# Patient Record
Sex: Male | Born: 1935 | Race: White | Hispanic: No | Marital: Married | Smoking: Former smoker
Health system: Southern US, Community
[De-identification: ages and names within clinical notes are randomized; demographics above are authoritative.]

## PROBLEM LIST (undated history)

## (undated) DIAGNOSIS — F329 Major depressive disorder, single episode, unspecified: Secondary | ICD-10-CM

## (undated) DIAGNOSIS — E559 Vitamin D deficiency, unspecified: Secondary | ICD-10-CM

## (undated) DIAGNOSIS — I493 Ventricular premature depolarization: Secondary | ICD-10-CM

## (undated) DIAGNOSIS — I4891 Unspecified atrial fibrillation: Secondary | ICD-10-CM

## (undated) DIAGNOSIS — E039 Hypothyroidism, unspecified: Secondary | ICD-10-CM

## (undated) DIAGNOSIS — I2510 Atherosclerotic heart disease of native coronary artery without angina pectoris: Secondary | ICD-10-CM

## (undated) DIAGNOSIS — H35039 Hypertensive retinopathy, unspecified eye: Secondary | ICD-10-CM

## (undated) DIAGNOSIS — F419 Anxiety disorder, unspecified: Secondary | ICD-10-CM

## (undated) DIAGNOSIS — R001 Bradycardia, unspecified: Secondary | ICD-10-CM

## (undated) DIAGNOSIS — F32A Depression, unspecified: Secondary | ICD-10-CM

## (undated) DIAGNOSIS — H3530 Unspecified macular degeneration: Secondary | ICD-10-CM

## (undated) HISTORY — PX: CHOLECYSTECTOMY: SHX55

## (undated) HISTORY — DX: Vitamin D deficiency, unspecified: E55.9

## (undated) HISTORY — DX: Unspecified atrial fibrillation: I48.91

## (undated) HISTORY — DX: Ventricular premature depolarization: I49.3

## (undated) HISTORY — DX: Unspecified macular degeneration: H35.30

## (undated) HISTORY — DX: Major depressive disorder, single episode, unspecified: F32.9

## (undated) HISTORY — DX: Bradycardia, unspecified: R00.1

## (undated) HISTORY — DX: Hypothyroidism, unspecified: E03.9

## (undated) HISTORY — DX: Anxiety disorder, unspecified: F41.9

## (undated) HISTORY — PX: EYE SURGERY: SHX253

## (undated) HISTORY — DX: Atherosclerotic heart disease of native coronary artery without angina pectoris: I25.10

## (undated) HISTORY — DX: Depression, unspecified: F32.A

## (undated) HISTORY — DX: Hypertensive retinopathy, unspecified eye: H35.039

## (undated) HISTORY — PX: HERNIA REPAIR: SHX51

## (undated) HISTORY — PX: CATARACT EXTRACTION: SUR2

---

## 2004-05-26 ENCOUNTER — Ambulatory Visit (HOSPITAL_COMMUNITY): Admission: RE | Admit: 2004-05-26 | Discharge: 2004-05-26 | Payer: Self-pay | Admitting: Specialist

## 2005-11-05 IMAGING — PT NM PET TUM IMG SKULL BASE T - THIGH
5 series · 25 of 25 positions shown · non-contrast
Comparison: The report from a CT of the chest done at [facility] on 05/10/2004 is correlated.  That study is not available for direct comparison.

CLINICAL DATA: Bilateral pulmonary nodules on outside chest CT.  No report of history of malignancy.

FDG PET-CT TUMOR IMAGING (SKULL BASE TO THIGHS):
Fasting Blood Glucose:  102.
TECHNIQUE: 16.7 mCi F-18 FDG were administered via right antedecubital fossa imaging.  Full ring PET imaging was performed from the skull base through the mid-thighs 80 minutes post-injection.  CT data was obtained and used for attenuation correction and anatomic localization only.  (This was not acquired as a diagnostic CT examination.)

[Series 1: pet ac · axial · 3.3mm · 4.69mm/px · z∈[-870,+0]mm · 7 of 267 slices shown]
[im 1/267]
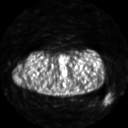
[im 45/267]
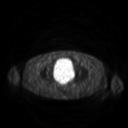
[im 89/267]
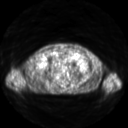
[im 134/267]
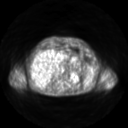
[im 178/267]
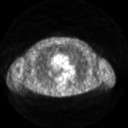
[im 222/267]
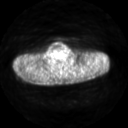
[im 267/267]
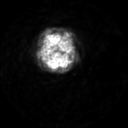

[Series 2: pet nac · axial · 3.3mm · 4.69mm/px · z∈[-870,+0]mm · 8 of 267 slices shown]
[im 1/267]
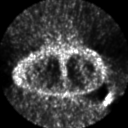
[im 39/267]
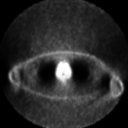
[im 77/267]
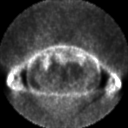
[im 115/267]
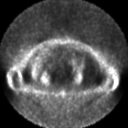
[im 153/267]
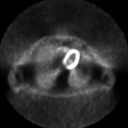
[im 191/267]
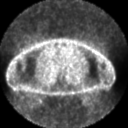
[im 229/267]
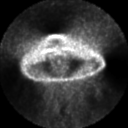
[im 267/267]
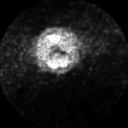

[Series 2: ct images · axial · 3.8mm · 0.98mm/px · z∈[-870,+0]mm · 8 of 267 slices shown]
[im 1/267]
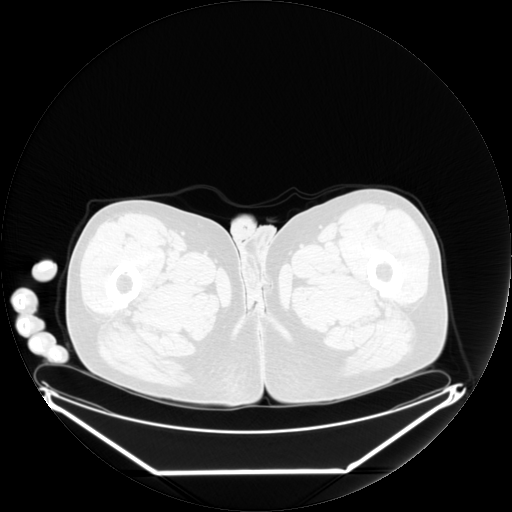
[im 39/267]
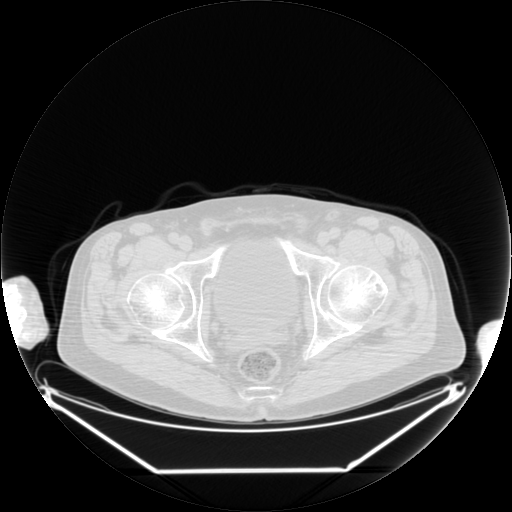
[im 77/267]
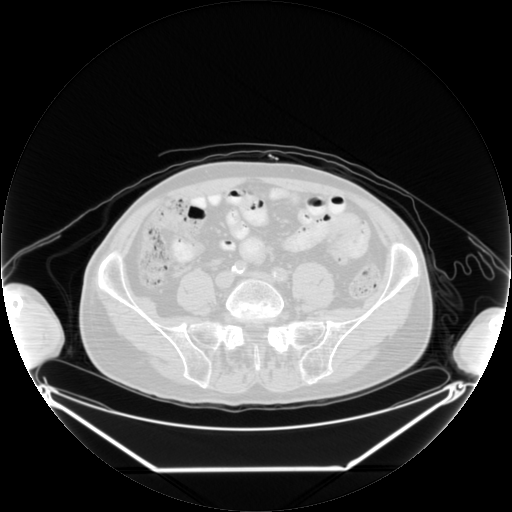
[im 115/267]
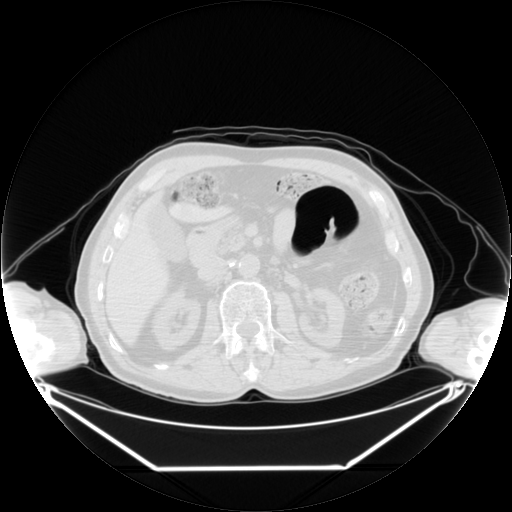
[im 153/267]
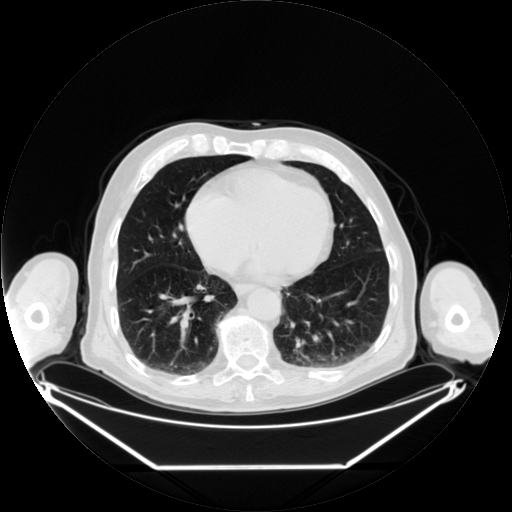
[im 191/267]
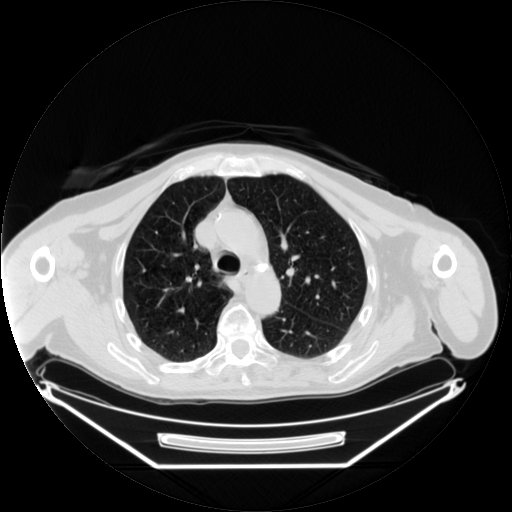
[im 229/267]
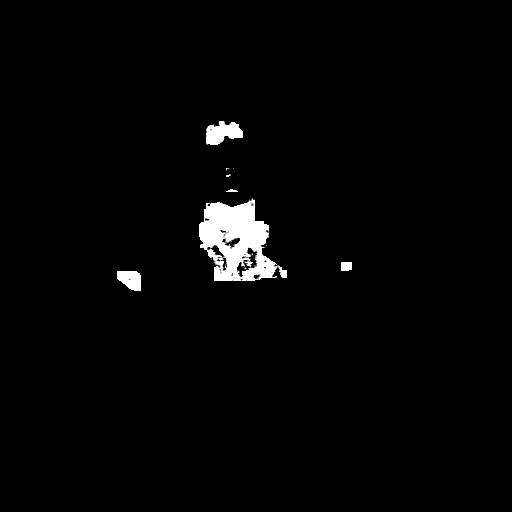
[im 267/267  brain]
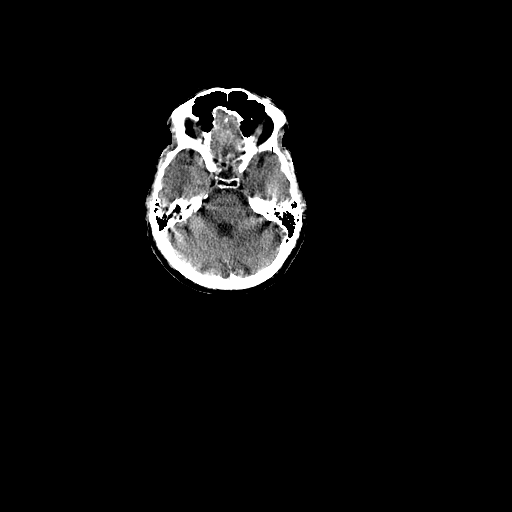

[Series 123: mip · coronal · 3.3mm · 4.69mm/px · 1 of 30 slices shown]
[im 1/30]
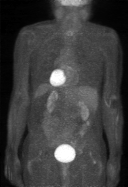

[Series 150: reformatted · axial · 3.3mm · 0.98mm/px · 1 of 5 slices shown]
[im 1/5]
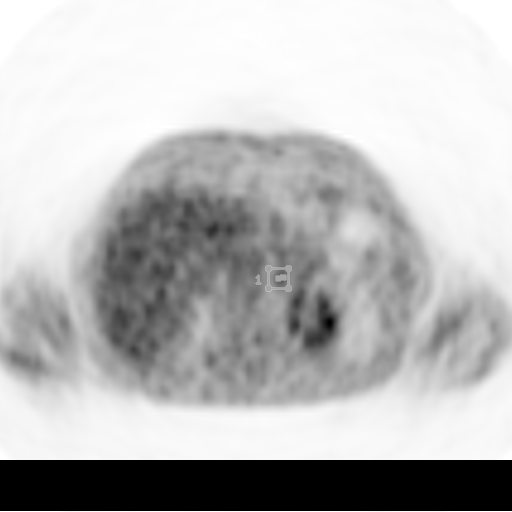

[25 of 25 positions shown; findings below may reference images not displayed]

FINDINGS: There is no hypermetabolic activity associated with the small nodule at the right posterior costophrenic angle.  This nodule measures 1.5 x 0.8cm transverse on CT image #132.  More superiorly in the left lower lobe, there is an irregular pulmonary nodule which measures 1.3 x 1.4cm on image #105.  No hypermetabolic activity is seen associated with this nodule either.  Its maximal SUV is 1.7 grams/ml.  
  No hypermetabolic nodal activity is seen within the neck, chest, abdomen, or pelvis.  The patient was noted to have small adrenal masses on the prior CT.  his measures 1.6cm in diameter on image #140 and measures 5 Hounsfield units.  emonstrates hypermetabolic activity, and these are compatible with incidental adrenal adenomas.  CT images demonstrate probable scarring in the right inguinal region with mildly increased metabolic activity on the PET images.
IMPRESSION: 1. Neither of the patient?s pulmonary nodules demonstrates hypermetabolic activity.  This would support a benign diagnosis.  Per the recent CT report, these nodules are stable from a prior CT performed 07/26/2000.  Routine CT follow-up could be performed to document further stability if clinically warranted.
2. There is no abnormal activity associated with the adrenal glands.  There are small adrenal adenomas bilaterally, larger on the left.
3. Probable postoperative scarring in the right groin.  Correlate clinically.

## 2014-03-17 DIAGNOSIS — I10 Essential (primary) hypertension: Secondary | ICD-10-CM | POA: Diagnosis not present

## 2014-03-17 DIAGNOSIS — E039 Hypothyroidism, unspecified: Secondary | ICD-10-CM | POA: Diagnosis not present

## 2014-03-17 DIAGNOSIS — I25810 Atherosclerosis of coronary artery bypass graft(s) without angina pectoris: Secondary | ICD-10-CM | POA: Diagnosis not present

## 2014-03-17 DIAGNOSIS — Z1389 Encounter for screening for other disorder: Secondary | ICD-10-CM | POA: Diagnosis not present

## 2014-03-17 DIAGNOSIS — R413 Other amnesia: Secondary | ICD-10-CM | POA: Diagnosis not present

## 2014-06-17 DIAGNOSIS — M1990 Unspecified osteoarthritis, unspecified site: Secondary | ICD-10-CM | POA: Diagnosis not present

## 2014-06-17 DIAGNOSIS — N289 Disorder of kidney and ureter, unspecified: Secondary | ICD-10-CM | POA: Diagnosis not present

## 2014-06-17 DIAGNOSIS — Z23 Encounter for immunization: Secondary | ICD-10-CM | POA: Diagnosis not present

## 2014-06-17 DIAGNOSIS — Z9181 History of falling: Secondary | ICD-10-CM | POA: Diagnosis not present

## 2014-06-17 DIAGNOSIS — I10 Essential (primary) hypertension: Secondary | ICD-10-CM | POA: Diagnosis not present

## 2014-06-17 DIAGNOSIS — E039 Hypothyroidism, unspecified: Secondary | ICD-10-CM | POA: Diagnosis not present

## 2014-06-17 DIAGNOSIS — Z1389 Encounter for screening for other disorder: Secondary | ICD-10-CM | POA: Diagnosis not present

## 2014-06-17 DIAGNOSIS — D500 Iron deficiency anemia secondary to blood loss (chronic): Secondary | ICD-10-CM | POA: Diagnosis not present

## 2014-06-17 DIAGNOSIS — Z6826 Body mass index (BMI) 26.0-26.9, adult: Secondary | ICD-10-CM | POA: Diagnosis not present

## 2014-06-25 DIAGNOSIS — H26493 Other secondary cataract, bilateral: Secondary | ICD-10-CM | POA: Diagnosis not present

## 2014-06-25 DIAGNOSIS — H524 Presbyopia: Secondary | ICD-10-CM | POA: Diagnosis not present

## 2014-08-20 DIAGNOSIS — R109 Unspecified abdominal pain: Secondary | ICD-10-CM | POA: Diagnosis not present

## 2014-08-20 DIAGNOSIS — L259 Unspecified contact dermatitis, unspecified cause: Secondary | ICD-10-CM | POA: Diagnosis not present

## 2014-08-20 DIAGNOSIS — Z6827 Body mass index (BMI) 27.0-27.9, adult: Secondary | ICD-10-CM | POA: Diagnosis not present

## 2014-09-29 DIAGNOSIS — H109 Unspecified conjunctivitis: Secondary | ICD-10-CM | POA: Diagnosis not present

## 2014-09-29 DIAGNOSIS — Z6826 Body mass index (BMI) 26.0-26.9, adult: Secondary | ICD-10-CM | POA: Diagnosis not present

## 2014-09-29 DIAGNOSIS — R600 Localized edema: Secondary | ICD-10-CM | POA: Diagnosis not present

## 2014-09-29 DIAGNOSIS — L03211 Cellulitis of face: Secondary | ICD-10-CM | POA: Diagnosis not present

## 2014-10-09 DIAGNOSIS — Z6826 Body mass index (BMI) 26.0-26.9, adult: Secondary | ICD-10-CM | POA: Diagnosis not present

## 2014-10-09 DIAGNOSIS — H109 Unspecified conjunctivitis: Secondary | ICD-10-CM | POA: Diagnosis not present

## 2014-10-17 DIAGNOSIS — E559 Vitamin D deficiency, unspecified: Secondary | ICD-10-CM | POA: Diagnosis not present

## 2014-10-17 DIAGNOSIS — D510 Vitamin B12 deficiency anemia due to intrinsic factor deficiency: Secondary | ICD-10-CM | POA: Diagnosis not present

## 2014-10-17 DIAGNOSIS — Z125 Encounter for screening for malignant neoplasm of prostate: Secondary | ICD-10-CM | POA: Diagnosis not present

## 2014-10-17 DIAGNOSIS — I10 Essential (primary) hypertension: Secondary | ICD-10-CM | POA: Diagnosis not present

## 2014-10-17 DIAGNOSIS — E039 Hypothyroidism, unspecified: Secondary | ICD-10-CM | POA: Diagnosis not present

## 2014-10-21 DIAGNOSIS — I4891 Unspecified atrial fibrillation: Secondary | ICD-10-CM | POA: Diagnosis not present

## 2014-10-21 DIAGNOSIS — H109 Unspecified conjunctivitis: Secondary | ICD-10-CM | POA: Diagnosis not present

## 2014-10-21 DIAGNOSIS — Z0000 Encounter for general adult medical examination without abnormal findings: Secondary | ICD-10-CM | POA: Diagnosis not present

## 2014-10-21 DIAGNOSIS — Z1212 Encounter for screening for malignant neoplasm of rectum: Secondary | ICD-10-CM | POA: Diagnosis not present

## 2014-10-21 DIAGNOSIS — I10 Essential (primary) hypertension: Secondary | ICD-10-CM | POA: Diagnosis not present

## 2014-12-10 DIAGNOSIS — Z23 Encounter for immunization: Secondary | ICD-10-CM | POA: Diagnosis not present

## 2015-01-21 DIAGNOSIS — I10 Essential (primary) hypertension: Secondary | ICD-10-CM | POA: Diagnosis not present

## 2015-01-21 DIAGNOSIS — E099 Drug or chemical induced diabetes mellitus without complications: Secondary | ICD-10-CM | POA: Diagnosis not present

## 2015-01-21 DIAGNOSIS — R634 Abnormal weight loss: Secondary | ICD-10-CM | POA: Diagnosis not present

## 2015-01-21 DIAGNOSIS — E559 Vitamin D deficiency, unspecified: Secondary | ICD-10-CM | POA: Diagnosis not present

## 2015-01-21 DIAGNOSIS — Z1389 Encounter for screening for other disorder: Secondary | ICD-10-CM | POA: Diagnosis not present

## 2015-01-21 DIAGNOSIS — M1990 Unspecified osteoarthritis, unspecified site: Secondary | ICD-10-CM | POA: Diagnosis not present

## 2015-02-02 DIAGNOSIS — H01005 Unspecified blepharitis left lower eyelid: Secondary | ICD-10-CM | POA: Diagnosis not present

## 2015-02-04 DIAGNOSIS — I10 Essential (primary) hypertension: Secondary | ICD-10-CM | POA: Diagnosis not present

## 2015-02-04 DIAGNOSIS — I482 Chronic atrial fibrillation: Secondary | ICD-10-CM | POA: Diagnosis not present

## 2015-02-04 DIAGNOSIS — J449 Chronic obstructive pulmonary disease, unspecified: Secondary | ICD-10-CM | POA: Diagnosis not present

## 2015-02-18 DIAGNOSIS — I4891 Unspecified atrial fibrillation: Secondary | ICD-10-CM | POA: Diagnosis not present

## 2015-02-18 DIAGNOSIS — Z6824 Body mass index (BMI) 24.0-24.9, adult: Secondary | ICD-10-CM | POA: Diagnosis not present

## 2015-02-18 DIAGNOSIS — I10 Essential (primary) hypertension: Secondary | ICD-10-CM | POA: Diagnosis not present

## 2015-02-18 DIAGNOSIS — R634 Abnormal weight loss: Secondary | ICD-10-CM | POA: Diagnosis not present

## 2015-02-26 DIAGNOSIS — H01005 Unspecified blepharitis left lower eyelid: Secondary | ICD-10-CM | POA: Diagnosis not present

## 2015-02-26 DIAGNOSIS — H26493 Other secondary cataract, bilateral: Secondary | ICD-10-CM | POA: Diagnosis not present

## 2015-04-20 DIAGNOSIS — I4891 Unspecified atrial fibrillation: Secondary | ICD-10-CM | POA: Diagnosis not present

## 2015-04-20 DIAGNOSIS — E559 Vitamin D deficiency, unspecified: Secondary | ICD-10-CM | POA: Diagnosis not present

## 2015-04-20 DIAGNOSIS — E039 Hypothyroidism, unspecified: Secondary | ICD-10-CM | POA: Diagnosis not present

## 2015-04-20 DIAGNOSIS — I10 Essential (primary) hypertension: Secondary | ICD-10-CM | POA: Diagnosis not present

## 2015-04-27 DIAGNOSIS — S42002A Fracture of unspecified part of left clavicle, initial encounter for closed fracture: Secondary | ICD-10-CM | POA: Diagnosis not present

## 2015-04-28 DIAGNOSIS — S42035A Nondisplaced fracture of lateral end of left clavicle, initial encounter for closed fracture: Secondary | ICD-10-CM | POA: Diagnosis not present

## 2015-07-22 DIAGNOSIS — Z139 Encounter for screening, unspecified: Secondary | ICD-10-CM | POA: Diagnosis not present

## 2015-07-22 DIAGNOSIS — Z9181 History of falling: Secondary | ICD-10-CM | POA: Diagnosis not present

## 2015-07-22 DIAGNOSIS — W19XXXA Unspecified fall, initial encounter: Secondary | ICD-10-CM | POA: Diagnosis not present

## 2015-07-22 DIAGNOSIS — E039 Hypothyroidism, unspecified: Secondary | ICD-10-CM | POA: Diagnosis not present

## 2015-07-22 DIAGNOSIS — D500 Iron deficiency anemia secondary to blood loss (chronic): Secondary | ICD-10-CM | POA: Diagnosis not present

## 2015-07-22 DIAGNOSIS — Z79899 Other long term (current) drug therapy: Secondary | ICD-10-CM | POA: Diagnosis not present

## 2015-07-28 DIAGNOSIS — I10 Essential (primary) hypertension: Secondary | ICD-10-CM | POA: Diagnosis not present

## 2015-07-28 DIAGNOSIS — S42002A Fracture of unspecified part of left clavicle, initial encounter for closed fracture: Secondary | ICD-10-CM | POA: Diagnosis not present

## 2015-07-28 DIAGNOSIS — D510 Vitamin B12 deficiency anemia due to intrinsic factor deficiency: Secondary | ICD-10-CM | POA: Diagnosis not present

## 2015-07-28 DIAGNOSIS — M1990 Unspecified osteoarthritis, unspecified site: Secondary | ICD-10-CM | POA: Diagnosis not present

## 2015-07-28 DIAGNOSIS — E559 Vitamin D deficiency, unspecified: Secondary | ICD-10-CM | POA: Diagnosis not present

## 2015-07-28 DIAGNOSIS — R413 Other amnesia: Secondary | ICD-10-CM | POA: Diagnosis not present

## 2015-07-28 DIAGNOSIS — I2510 Atherosclerotic heart disease of native coronary artery without angina pectoris: Secondary | ICD-10-CM | POA: Diagnosis not present

## 2015-07-28 DIAGNOSIS — G629 Polyneuropathy, unspecified: Secondary | ICD-10-CM | POA: Diagnosis not present

## 2015-07-28 DIAGNOSIS — K589 Irritable bowel syndrome without diarrhea: Secondary | ICD-10-CM | POA: Diagnosis not present

## 2015-07-28 DIAGNOSIS — W19XXXA Unspecified fall, initial encounter: Secondary | ICD-10-CM | POA: Diagnosis not present

## 2015-07-28 DIAGNOSIS — M545 Low back pain: Secondary | ICD-10-CM | POA: Diagnosis not present

## 2015-07-28 DIAGNOSIS — I4891 Unspecified atrial fibrillation: Secondary | ICD-10-CM | POA: Diagnosis not present

## 2015-07-28 DIAGNOSIS — E039 Hypothyroidism, unspecified: Secondary | ICD-10-CM | POA: Diagnosis not present

## 2015-07-28 DIAGNOSIS — R001 Bradycardia, unspecified: Secondary | ICD-10-CM | POA: Diagnosis not present

## 2015-07-28 DIAGNOSIS — I491 Atrial premature depolarization: Secondary | ICD-10-CM | POA: Diagnosis not present

## 2015-07-28 DIAGNOSIS — N289 Disorder of kidney and ureter, unspecified: Secondary | ICD-10-CM | POA: Diagnosis not present

## 2015-07-28 DIAGNOSIS — M6281 Muscle weakness (generalized): Secondary | ICD-10-CM | POA: Diagnosis not present

## 2015-07-28 DIAGNOSIS — I493 Ventricular premature depolarization: Secondary | ICD-10-CM | POA: Diagnosis not present

## 2015-09-09 DIAGNOSIS — J019 Acute sinusitis, unspecified: Secondary | ICD-10-CM | POA: Diagnosis not present

## 2015-09-09 DIAGNOSIS — J209 Acute bronchitis, unspecified: Secondary | ICD-10-CM | POA: Diagnosis not present

## 2015-09-09 DIAGNOSIS — I25810 Atherosclerosis of coronary artery bypass graft(s) without angina pectoris: Secondary | ICD-10-CM | POA: Diagnosis not present

## 2015-09-21 DIAGNOSIS — E039 Hypothyroidism, unspecified: Secondary | ICD-10-CM | POA: Diagnosis not present

## 2015-09-21 DIAGNOSIS — Z79899 Other long term (current) drug therapy: Secondary | ICD-10-CM | POA: Diagnosis not present

## 2015-09-21 DIAGNOSIS — I10 Essential (primary) hypertension: Secondary | ICD-10-CM | POA: Diagnosis not present

## 2015-09-21 DIAGNOSIS — N289 Disorder of kidney and ureter, unspecified: Secondary | ICD-10-CM | POA: Diagnosis not present

## 2015-09-21 DIAGNOSIS — E559 Vitamin D deficiency, unspecified: Secondary | ICD-10-CM | POA: Diagnosis not present

## 2015-09-21 DIAGNOSIS — E785 Hyperlipidemia, unspecified: Secondary | ICD-10-CM | POA: Diagnosis not present

## 2015-09-21 DIAGNOSIS — J309 Allergic rhinitis, unspecified: Secondary | ICD-10-CM | POA: Diagnosis not present

## 2015-09-21 DIAGNOSIS — I4891 Unspecified atrial fibrillation: Secondary | ICD-10-CM | POA: Diagnosis not present

## 2015-10-12 DIAGNOSIS — I10 Essential (primary) hypertension: Secondary | ICD-10-CM | POA: Diagnosis not present

## 2015-10-12 DIAGNOSIS — H903 Sensorineural hearing loss, bilateral: Secondary | ICD-10-CM | POA: Diagnosis not present

## 2015-10-12 DIAGNOSIS — H905 Unspecified sensorineural hearing loss: Secondary | ICD-10-CM | POA: Diagnosis not present

## 2015-12-21 DIAGNOSIS — I4891 Unspecified atrial fibrillation: Secondary | ICD-10-CM | POA: Diagnosis not present

## 2015-12-21 DIAGNOSIS — N289 Disorder of kidney and ureter, unspecified: Secondary | ICD-10-CM | POA: Diagnosis not present

## 2015-12-21 DIAGNOSIS — J309 Allergic rhinitis, unspecified: Secondary | ICD-10-CM | POA: Diagnosis not present

## 2015-12-21 DIAGNOSIS — Z23 Encounter for immunization: Secondary | ICD-10-CM | POA: Diagnosis not present

## 2015-12-21 DIAGNOSIS — I10 Essential (primary) hypertension: Secondary | ICD-10-CM | POA: Diagnosis not present

## 2016-01-22 DIAGNOSIS — R0602 Shortness of breath: Secondary | ICD-10-CM | POA: Diagnosis not present

## 2016-01-22 DIAGNOSIS — J181 Lobar pneumonia, unspecified organism: Secondary | ICD-10-CM | POA: Diagnosis not present

## 2016-01-22 DIAGNOSIS — Z87891 Personal history of nicotine dependence: Secondary | ICD-10-CM | POA: Diagnosis not present

## 2016-01-22 DIAGNOSIS — R609 Edema, unspecified: Secondary | ICD-10-CM | POA: Diagnosis not present

## 2016-01-22 DIAGNOSIS — R509 Fever, unspecified: Secondary | ICD-10-CM | POA: Diagnosis not present

## 2016-01-22 DIAGNOSIS — I4891 Unspecified atrial fibrillation: Secondary | ICD-10-CM | POA: Diagnosis not present

## 2016-01-22 DIAGNOSIS — J159 Unspecified bacterial pneumonia: Secondary | ICD-10-CM | POA: Diagnosis not present

## 2016-01-22 DIAGNOSIS — J189 Pneumonia, unspecified organism: Secondary | ICD-10-CM | POA: Diagnosis not present

## 2016-01-22 DIAGNOSIS — Z7901 Long term (current) use of anticoagulants: Secondary | ICD-10-CM | POA: Diagnosis not present

## 2016-01-22 DIAGNOSIS — J440 Chronic obstructive pulmonary disease with acute lower respiratory infection: Secondary | ICD-10-CM | POA: Diagnosis not present

## 2016-01-22 DIAGNOSIS — R531 Weakness: Secondary | ICD-10-CM | POA: Diagnosis not present

## 2016-01-22 DIAGNOSIS — I482 Chronic atrial fibrillation: Secondary | ICD-10-CM | POA: Diagnosis not present

## 2016-01-22 DIAGNOSIS — J9601 Acute respiratory failure with hypoxia: Secondary | ICD-10-CM | POA: Diagnosis not present

## 2016-01-22 DIAGNOSIS — E039 Hypothyroidism, unspecified: Secondary | ICD-10-CM | POA: Diagnosis not present

## 2016-01-22 DIAGNOSIS — A419 Sepsis, unspecified organism: Secondary | ICD-10-CM | POA: Diagnosis not present

## 2016-01-22 DIAGNOSIS — J9691 Respiratory failure, unspecified with hypoxia: Secondary | ICD-10-CM | POA: Diagnosis not present

## 2016-01-22 DIAGNOSIS — Z79899 Other long term (current) drug therapy: Secondary | ICD-10-CM | POA: Diagnosis not present

## 2016-01-22 DIAGNOSIS — R339 Retention of urine, unspecified: Secondary | ICD-10-CM | POA: Diagnosis not present

## 2016-01-22 DIAGNOSIS — I10 Essential (primary) hypertension: Secondary | ICD-10-CM | POA: Diagnosis not present

## 2016-01-22 DIAGNOSIS — I481 Persistent atrial fibrillation: Secondary | ICD-10-CM | POA: Diagnosis not present

## 2016-01-28 DIAGNOSIS — Z79899 Other long term (current) drug therapy: Secondary | ICD-10-CM | POA: Diagnosis not present

## 2016-01-28 DIAGNOSIS — Z09 Encounter for follow-up examination after completed treatment for conditions other than malignant neoplasm: Secondary | ICD-10-CM | POA: Diagnosis not present

## 2016-01-28 DIAGNOSIS — J189 Pneumonia, unspecified organism: Secondary | ICD-10-CM | POA: Diagnosis not present

## 2016-02-17 DIAGNOSIS — I482 Chronic atrial fibrillation: Secondary | ICD-10-CM | POA: Diagnosis not present

## 2016-02-17 DIAGNOSIS — I10 Essential (primary) hypertension: Secondary | ICD-10-CM | POA: Diagnosis not present

## 2016-02-17 DIAGNOSIS — J449 Chronic obstructive pulmonary disease, unspecified: Secondary | ICD-10-CM | POA: Diagnosis not present

## 2016-03-22 DIAGNOSIS — I25810 Atherosclerosis of coronary artery bypass graft(s) without angina pectoris: Secondary | ICD-10-CM | POA: Diagnosis not present

## 2016-03-22 DIAGNOSIS — Z1389 Encounter for screening for other disorder: Secondary | ICD-10-CM | POA: Diagnosis not present

## 2016-03-22 DIAGNOSIS — E559 Vitamin D deficiency, unspecified: Secondary | ICD-10-CM | POA: Diagnosis not present

## 2016-03-22 DIAGNOSIS — I4891 Unspecified atrial fibrillation: Secondary | ICD-10-CM | POA: Diagnosis not present

## 2016-03-22 DIAGNOSIS — E039 Hypothyroidism, unspecified: Secondary | ICD-10-CM | POA: Diagnosis not present

## 2016-03-22 DIAGNOSIS — I10 Essential (primary) hypertension: Secondary | ICD-10-CM | POA: Diagnosis not present

## 2016-03-22 DIAGNOSIS — Z79899 Other long term (current) drug therapy: Secondary | ICD-10-CM | POA: Diagnosis not present

## 2016-04-06 DIAGNOSIS — J069 Acute upper respiratory infection, unspecified: Secondary | ICD-10-CM | POA: Diagnosis not present

## 2016-04-06 DIAGNOSIS — R6889 Other general symptoms and signs: Secondary | ICD-10-CM | POA: Diagnosis not present

## 2016-04-27 DIAGNOSIS — Z136 Encounter for screening for cardiovascular disorders: Secondary | ICD-10-CM | POA: Diagnosis not present

## 2016-04-27 DIAGNOSIS — Z125 Encounter for screening for malignant neoplasm of prostate: Secondary | ICD-10-CM | POA: Diagnosis not present

## 2016-04-27 DIAGNOSIS — Z1389 Encounter for screening for other disorder: Secondary | ICD-10-CM | POA: Diagnosis not present

## 2016-04-27 DIAGNOSIS — Z0000 Encounter for general adult medical examination without abnormal findings: Secondary | ICD-10-CM | POA: Diagnosis not present

## 2016-04-27 DIAGNOSIS — Z9181 History of falling: Secondary | ICD-10-CM | POA: Diagnosis not present

## 2016-04-27 DIAGNOSIS — R0602 Shortness of breath: Secondary | ICD-10-CM | POA: Diagnosis not present

## 2016-04-27 DIAGNOSIS — J449 Chronic obstructive pulmonary disease, unspecified: Secondary | ICD-10-CM | POA: Diagnosis not present

## 2016-04-28 DIAGNOSIS — J449 Chronic obstructive pulmonary disease, unspecified: Secondary | ICD-10-CM | POA: Diagnosis not present

## 2016-04-28 DIAGNOSIS — J45909 Unspecified asthma, uncomplicated: Secondary | ICD-10-CM | POA: Diagnosis not present

## 2016-04-28 DIAGNOSIS — J309 Allergic rhinitis, unspecified: Secondary | ICD-10-CM | POA: Diagnosis not present

## 2016-05-02 DIAGNOSIS — H26493 Other secondary cataract, bilateral: Secondary | ICD-10-CM | POA: Diagnosis not present

## 2016-05-02 DIAGNOSIS — H532 Diplopia: Secondary | ICD-10-CM | POA: Diagnosis not present

## 2016-07-19 DIAGNOSIS — J309 Allergic rhinitis, unspecified: Secondary | ICD-10-CM | POA: Diagnosis not present

## 2016-07-19 DIAGNOSIS — I4891 Unspecified atrial fibrillation: Secondary | ICD-10-CM | POA: Diagnosis not present

## 2016-07-19 DIAGNOSIS — D510 Vitamin B12 deficiency anemia due to intrinsic factor deficiency: Secondary | ICD-10-CM | POA: Diagnosis not present

## 2016-07-19 DIAGNOSIS — I25810 Atherosclerosis of coronary artery bypass graft(s) without angina pectoris: Secondary | ICD-10-CM | POA: Diagnosis not present

## 2016-07-19 DIAGNOSIS — K449 Diaphragmatic hernia without obstruction or gangrene: Secondary | ICD-10-CM | POA: Diagnosis not present

## 2016-09-05 DIAGNOSIS — J209 Acute bronchitis, unspecified: Secondary | ICD-10-CM | POA: Diagnosis not present

## 2016-11-17 DIAGNOSIS — I25810 Atherosclerosis of coronary artery bypass graft(s) without angina pectoris: Secondary | ICD-10-CM | POA: Diagnosis not present

## 2016-11-17 DIAGNOSIS — Z79899 Other long term (current) drug therapy: Secondary | ICD-10-CM | POA: Diagnosis not present

## 2016-11-17 DIAGNOSIS — J449 Chronic obstructive pulmonary disease, unspecified: Secondary | ICD-10-CM | POA: Diagnosis not present

## 2016-11-17 DIAGNOSIS — Z23 Encounter for immunization: Secondary | ICD-10-CM | POA: Diagnosis not present

## 2016-11-17 DIAGNOSIS — E559 Vitamin D deficiency, unspecified: Secondary | ICD-10-CM | POA: Diagnosis not present

## 2016-11-17 DIAGNOSIS — D510 Vitamin B12 deficiency anemia due to intrinsic factor deficiency: Secondary | ICD-10-CM | POA: Diagnosis not present

## 2016-11-17 DIAGNOSIS — I4891 Unspecified atrial fibrillation: Secondary | ICD-10-CM | POA: Diagnosis not present

## 2016-11-28 DIAGNOSIS — L570 Actinic keratosis: Secondary | ICD-10-CM | POA: Diagnosis not present

## 2016-11-29 ENCOUNTER — Ambulatory Visit: Payer: Self-pay | Admitting: Cardiology

## 2016-11-30 ENCOUNTER — Other Ambulatory Visit: Payer: Self-pay

## 2016-12-01 ENCOUNTER — Encounter: Payer: Self-pay | Admitting: Cardiology

## 2016-12-01 ENCOUNTER — Ambulatory Visit (INDEPENDENT_AMBULATORY_CARE_PROVIDER_SITE_OTHER): Payer: Medicare Other | Admitting: Cardiology

## 2016-12-01 VITALS — BP 154/82 | HR 77 | Ht 72.0 in | Wt 204.1 lb

## 2016-12-01 DIAGNOSIS — I4891 Unspecified atrial fibrillation: Secondary | ICD-10-CM

## 2016-12-01 MED ORDER — RIVAROXABAN 20 MG PO TABS: 20.0000 mg | ORAL_TABLET | Freq: Every day | ORAL | 1 refills | Status: DC

## 2016-12-01 NOTE — Progress Notes (Signed)
Cardiology Office Note:    Date:  12/01/2016   ID:  Eddie Kelly, DOB [birth year], MRN [id number]  PCP:  [name], MD  Cardiologist:  Garwin Brothers, MD   Referring MD: [name], MD    ASSESSMENT:    1. Atrial fibrillation, unspecified type (HCC)    PLAN:    In order of problems listed above:  1. I discussed with the patient atrial fibrillation, disease process. Management and therapy including rate and rhythm control, anticoagulation benefits and potential risks were discussed extensively with the patient. Patient had multiple questions which were answered to patient's satisfaction. 2. I checked his renal function based on recent lab work and increased his serum 2-20 mg daily. He was advised to take this medication with food. 3. His blood pressure stable. Lab work was reviewed. He will be seen in follow-up appointment on an annual basis or earlier if he has any concerns.   Medication Adjustments/Labs and Tests Ordered: Current medicines are reviewed at length with the patient today.  Concerns regarding medicines are outlined above.  No orders of the defined types were placed in this encounter.  Meds ordered this encounter  Medications  . rivaroxaban (XARELTO) 20 MG TABS tablet    Sig: Take 1 tablet (20 mg total) by mouth daily with supper.    Dispense:  90 tablet    Refill:  1     History of Present Illness:    Eddie Kelly is a 83 y.o. male who is being seen today for the evaluation of atrial fibrillation at the request of [name], MD. Patient is a pleasant 83 year old male. He has past medical history of coronary artery disease, atrial fibrillation. He leads a sedentary lifestyle but ambulates age appropriately. He denies any chest pain orthopnea or PND. At the time of my evaluation is alert awake oriented and in no distress. He saw my partner in my previous practice and now he wishes to transfer his care to this practice under my care.  Past Medical  History:  Diagnosis Date  . A-fib (HCC)   . Anxiety and depression   . CAD (coronary artery disease)   . Hypothyroidism   . PVC (premature ventricular contraction)   . Sinus bradycardia   . Vitamin D deficiency     History reviewed. No pertinent surgical history.  Current Medications: Current Meds  Medication Sig  . buPROPion (WELLBUTRIN XL) 150 MG 24 hr tablet Take 150 mg by mouth daily.  . Cholecalciferol (VITAMIN D PO) Take 5,000 Units by mouth continuous dialysis.   Marland Kitchen cyanocobalamin (,VITAMIN B-12,) 1000 MCG/ML injection Inject 1,000 mcg into the muscle every 30 (thirty) days.  . fluticasone (KLS ALLER-FLO) 50 MCG/ACT nasal spray Place 1 spray into both nostrils daily.  . furosemide (LASIX) 20 MG tablet Take 20 mg by mouth daily.  Marland Kitchen levothyroxine (SYNTHROID, LEVOTHROID) 88 MCG tablet Take 88 mcg by mouth daily.  Marland Kitchen loratadine (CLARITIN) 10 MG tablet Take 10 mg by mouth daily.  Marland Kitchen losartan (COZAAR) 100 MG tablet Take 100 mg by mouth daily.  . metoprolol tartrate (LOPRESSOR) 25 MG tablet Take 25 mg by mouth 2 (two) times daily.  . Misc Natural Products (PROSTATE HEALTH) CAPS Take 1 capsule by mouth daily.  . nitroGLYCERIN (NITROSTAT) 0.4 MG SL tablet Place 0.4 mg under the tongue every 5 (five) minutes as needed.  . [DISCONTINUED] Rivaroxaban (XARELTO) 15 MG TABS tablet Take 15 mg by mouth daily.     Allergies:  Patient has no known allergies.   Social History   Social History  . Marital status: Married    Spouse name: N/A  . Number of children: N/A  . Years of education: N/A   Social History Main Topics  . Smoking status: Former Smoker    Quit date: 1983  . Smokeless tobacco: Never Used  . Alcohol use 0.6 oz/week    1 Cans of beer per week     Comment: "every once in a blue moon"  . Drug use: No  . Sexual activity: Not Asked   Other Topics Concern  . None   Social History Narrative  . None     Family History: The patient's family history includes Cancer in  his father; Diabetes in his brother; Heart attack in his father; Heart disease in his brother.  ROS:   Please see the history of present illness.    All other systems reviewed and are negative.  EKGs/Labs/Other Studies Reviewed:    The following studies were reviewed today: I reviewed his records and discussed his condition with him extensively. Questions were answered to his satisfaction.   Recent Labs: No results found for requested labs within last 8760 hours.  Recent Lipid Panel No results found for: CHOL, TRIG, HDL, CHOLHDL, VLDL, LDLCALC, LDLDIRECT  Physical Exam:    VS:  BP (!) 154/82   Pulse 77   Ht 6' (1.829 m)   Wt 204 lb 1.9 oz (92.6 kg)   SpO2 93%   BMI 27.68 kg/m     Wt Readings from Last 3 Encounters:  12/01/16 204 lb 1.9 oz (92.6 kg)     GEN: Patient is in no acute distress HEENT: Normal NECK: No JVD; No carotid bruits LYMPHATICS: No lymphadenopathy CARDIAC: S1 S2 irregular, 2/6 systolic murmur at the apex. RESPIRATORY:  Clear to auscultation without rales, wheezing or rhonchi  ABDOMEN: Soft, non-tender, non-distended MUSCULOSKELETAL:  No edema; No deformity  SKIN: Warm and dry NEUROLOGIC:  Alert and oriented x 3 PSYCHIATRIC:  Normal affect    Signed, Garwin Brothers, MD  12/01/2016 4:01 PM    [facility]

## 2016-12-01 NOTE — Patient Instructions (Signed)
Medication Instructions:   Your physician has recommended you make the following change in your medication:   CHANGE: Xarelto 15 mg to Xarelto 20 mg once daily.   Labwork:  NONE  Testing/Procedures:  NONE  Follow-Up: Your physician recommends that you schedule a follow-up appointment in: 1 year with Dr. Tomie China.   Any Other Special Instructions Will Be Listed Below (If Applicable).     If you need a refill on your cardiac medications before your next appointment, please call your pharmacy.

## 2017-03-21 DIAGNOSIS — I10 Essential (primary) hypertension: Secondary | ICD-10-CM | POA: Diagnosis not present

## 2017-03-21 DIAGNOSIS — I4891 Unspecified atrial fibrillation: Secondary | ICD-10-CM | POA: Diagnosis not present

## 2017-03-21 DIAGNOSIS — J309 Allergic rhinitis, unspecified: Secondary | ICD-10-CM | POA: Diagnosis not present

## 2017-03-23 DIAGNOSIS — J449 Chronic obstructive pulmonary disease, unspecified: Secondary | ICD-10-CM | POA: Diagnosis not present

## 2017-03-23 DIAGNOSIS — J690 Pneumonitis due to inhalation of food and vomit: Secondary | ICD-10-CM | POA: Diagnosis not present

## 2017-03-23 DIAGNOSIS — R079 Chest pain, unspecified: Secondary | ICD-10-CM | POA: Diagnosis not present

## 2017-03-23 DIAGNOSIS — R000 Tachycardia, unspecified: Secondary | ICD-10-CM | POA: Diagnosis not present

## 2017-03-23 DIAGNOSIS — J9601 Acute respiratory failure with hypoxia: Secondary | ICD-10-CM | POA: Diagnosis not present

## 2017-03-23 DIAGNOSIS — R112 Nausea with vomiting, unspecified: Secondary | ICD-10-CM | POA: Diagnosis not present

## 2017-03-23 DIAGNOSIS — I480 Paroxysmal atrial fibrillation: Secondary | ICD-10-CM | POA: Diagnosis not present

## 2017-03-23 DIAGNOSIS — Z7901 Long term (current) use of anticoagulants: Secondary | ICD-10-CM | POA: Diagnosis not present

## 2017-03-23 DIAGNOSIS — I482 Chronic atrial fibrillation: Secondary | ICD-10-CM | POA: Diagnosis not present

## 2017-03-23 DIAGNOSIS — Z79899 Other long term (current) drug therapy: Secondary | ICD-10-CM | POA: Diagnosis not present

## 2017-03-23 DIAGNOSIS — Z87891 Personal history of nicotine dependence: Secondary | ICD-10-CM | POA: Diagnosis not present

## 2017-03-23 DIAGNOSIS — E785 Hyperlipidemia, unspecified: Secondary | ICD-10-CM | POA: Diagnosis not present

## 2017-03-23 DIAGNOSIS — I10 Essential (primary) hypertension: Secondary | ICD-10-CM | POA: Diagnosis not present

## 2017-03-23 DIAGNOSIS — R110 Nausea: Secondary | ICD-10-CM | POA: Diagnosis not present

## 2017-03-23 DIAGNOSIS — A419 Sepsis, unspecified organism: Secondary | ICD-10-CM | POA: Diagnosis not present

## 2017-03-23 DIAGNOSIS — E7800 Pure hypercholesterolemia, unspecified: Secondary | ICD-10-CM | POA: Diagnosis not present

## 2017-03-23 DIAGNOSIS — E039 Hypothyroidism, unspecified: Secondary | ICD-10-CM | POA: Diagnosis not present

## 2017-03-23 DIAGNOSIS — R05 Cough: Secondary | ICD-10-CM | POA: Diagnosis not present

## 2017-03-23 DIAGNOSIS — R509 Fever, unspecified: Secondary | ICD-10-CM | POA: Diagnosis not present

## 2017-03-23 DIAGNOSIS — R0602 Shortness of breath: Secondary | ICD-10-CM | POA: Diagnosis not present

## 2017-03-23 DIAGNOSIS — D638 Anemia in other chronic diseases classified elsewhere: Secondary | ICD-10-CM | POA: Diagnosis not present

## 2017-03-24 DIAGNOSIS — R0602 Shortness of breath: Secondary | ICD-10-CM | POA: Diagnosis not present

## 2017-04-10 DIAGNOSIS — J189 Pneumonia, unspecified organism: Secondary | ICD-10-CM | POA: Diagnosis not present

## 2017-04-10 DIAGNOSIS — Z09 Encounter for follow-up examination after completed treatment for conditions other than malignant neoplasm: Secondary | ICD-10-CM | POA: Diagnosis not present

## 2017-04-10 DIAGNOSIS — J181 Lobar pneumonia, unspecified organism: Secondary | ICD-10-CM | POA: Diagnosis not present

## 2017-04-10 DIAGNOSIS — R509 Fever, unspecified: Secondary | ICD-10-CM | POA: Diagnosis not present

## 2017-04-18 DIAGNOSIS — Z1331 Encounter for screening for depression: Secondary | ICD-10-CM | POA: Diagnosis not present

## 2017-04-18 DIAGNOSIS — Z9981 Dependence on supplemental oxygen: Secondary | ICD-10-CM | POA: Diagnosis not present

## 2017-04-18 DIAGNOSIS — R9389 Abnormal findings on diagnostic imaging of other specified body structures: Secondary | ICD-10-CM | POA: Diagnosis not present

## 2017-04-28 DIAGNOSIS — Z1331 Encounter for screening for depression: Secondary | ICD-10-CM | POA: Diagnosis not present

## 2017-04-28 DIAGNOSIS — Z9181 History of falling: Secondary | ICD-10-CM | POA: Diagnosis not present

## 2017-04-28 DIAGNOSIS — E785 Hyperlipidemia, unspecified: Secondary | ICD-10-CM | POA: Diagnosis not present

## 2017-04-28 DIAGNOSIS — Z125 Encounter for screening for malignant neoplasm of prostate: Secondary | ICD-10-CM | POA: Diagnosis not present

## 2017-04-28 DIAGNOSIS — Z0000 Encounter for general adult medical examination without abnormal findings: Secondary | ICD-10-CM | POA: Diagnosis not present

## 2017-05-23 DIAGNOSIS — J449 Chronic obstructive pulmonary disease, unspecified: Secondary | ICD-10-CM | POA: Diagnosis not present

## 2017-05-26 DIAGNOSIS — R918 Other nonspecific abnormal finding of lung field: Secondary | ICD-10-CM | POA: Diagnosis not present

## 2017-05-26 DIAGNOSIS — J439 Emphysema, unspecified: Secondary | ICD-10-CM | POA: Diagnosis not present

## 2017-05-29 DIAGNOSIS — N289 Disorder of kidney and ureter, unspecified: Secondary | ICD-10-CM | POA: Diagnosis not present

## 2017-05-29 DIAGNOSIS — Z139 Encounter for screening, unspecified: Secondary | ICD-10-CM | POA: Diagnosis not present

## 2017-05-29 DIAGNOSIS — E039 Hypothyroidism, unspecified: Secondary | ICD-10-CM | POA: Diagnosis not present

## 2017-05-31 DIAGNOSIS — E039 Hypothyroidism, unspecified: Secondary | ICD-10-CM | POA: Diagnosis not present

## 2017-05-31 DIAGNOSIS — E559 Vitamin D deficiency, unspecified: Secondary | ICD-10-CM | POA: Diagnosis not present

## 2017-05-31 DIAGNOSIS — Z79899 Other long term (current) drug therapy: Secondary | ICD-10-CM | POA: Diagnosis not present

## 2017-05-31 DIAGNOSIS — E785 Hyperlipidemia, unspecified: Secondary | ICD-10-CM | POA: Diagnosis not present

## 2017-06-28 DIAGNOSIS — M25572 Pain in left ankle and joints of left foot: Secondary | ICD-10-CM | POA: Diagnosis not present

## 2017-07-02 DIAGNOSIS — R2242 Localized swelling, mass and lump, left lower limb: Secondary | ICD-10-CM | POA: Diagnosis not present

## 2017-07-02 DIAGNOSIS — L03116 Cellulitis of left lower limb: Secondary | ICD-10-CM | POA: Diagnosis not present

## 2017-07-25 DIAGNOSIS — M7989 Other specified soft tissue disorders: Secondary | ICD-10-CM | POA: Diagnosis not present

## 2017-07-25 DIAGNOSIS — Z79899 Other long term (current) drug therapy: Secondary | ICD-10-CM | POA: Diagnosis not present

## 2017-07-25 DIAGNOSIS — S61411A Laceration without foreign body of right hand, initial encounter: Secondary | ICD-10-CM | POA: Diagnosis not present

## 2017-08-02 DIAGNOSIS — M7989 Other specified soft tissue disorders: Secondary | ICD-10-CM | POA: Diagnosis not present

## 2017-08-02 DIAGNOSIS — K4090 Unilateral inguinal hernia, without obstruction or gangrene, not specified as recurrent: Secondary | ICD-10-CM | POA: Diagnosis not present

## 2017-08-02 DIAGNOSIS — M79672 Pain in left foot: Secondary | ICD-10-CM | POA: Diagnosis not present

## 2017-08-10 DIAGNOSIS — K4090 Unilateral inguinal hernia, without obstruction or gangrene, not specified as recurrent: Secondary | ICD-10-CM

## 2017-08-10 HISTORY — DX: Unilateral inguinal hernia, without obstruction or gangrene, not specified as recurrent: K40.90

## 2017-09-07 ENCOUNTER — Other Ambulatory Visit: Payer: Self-pay | Admitting: Cardiology

## 2017-09-07 DIAGNOSIS — I4891 Unspecified atrial fibrillation: Secondary | ICD-10-CM

## 2017-09-18 DIAGNOSIS — J4520 Mild intermittent asthma, uncomplicated: Secondary | ICD-10-CM | POA: Diagnosis not present

## 2017-09-20 ENCOUNTER — Telehealth: Payer: Self-pay | Admitting: *Deleted

## 2017-09-20 NOTE — Telephone Encounter (Signed)
Left message on patient's home phone to call our office to schedule an appointment to be seen by Dr. Tomie Chinaevankar before he can be cleared for surgery.

## 2017-09-26 ENCOUNTER — Ambulatory Visit (INDEPENDENT_AMBULATORY_CARE_PROVIDER_SITE_OTHER): Payer: Medicare Other | Admitting: Cardiology

## 2017-09-26 ENCOUNTER — Encounter: Payer: Self-pay | Admitting: Cardiology

## 2017-09-26 VITALS — BP 174/98 | HR 78 | Ht 72.0 in | Wt 201.0 lb

## 2017-09-26 DIAGNOSIS — Z01810 Encounter for preprocedural cardiovascular examination: Secondary | ICD-10-CM | POA: Diagnosis not present

## 2017-09-26 DIAGNOSIS — K4090 Unilateral inguinal hernia, without obstruction or gangrene, not specified as recurrent: Secondary | ICD-10-CM | POA: Diagnosis not present

## 2017-09-26 DIAGNOSIS — I482 Chronic atrial fibrillation, unspecified: Secondary | ICD-10-CM

## 2017-09-26 HISTORY — DX: Encounter for preprocedural cardiovascular examination: Z01.810

## 2017-09-26 NOTE — Patient Instructions (Signed)
Medication Instructions:  Your physician recommends that you continue on your current medications as directed. Please refer to the Current Medication list given to you today.   Labwork: NONE  Testing/Procedures: Your physician has requested that you have an echocardiogram. Echocardiography is a painless test that uses sound waves to create images of your heart. It provides your doctor with information about the size and shape of your heart and how well your heart's chambers and valves are working. This procedure takes approximately one hour. There are no restrictions for this procedure. [facility]    Follow-Up: Your physician wants you to follow-up in: 6 months.  You will receive a reminder letter in the mail two months in advance. If you don't receive a letter, please call our office to schedule the follow-up appointment.   Any Other Special Instructions Will Be Listed Below (If Applicable).     If you need a refill on your cardiac medications before your next appointment, please call your pharmacy.

## 2017-09-26 NOTE — Addendum Note (Signed)
Addended by: Lamona CurlOUTH, NICOLE H on: 09/26/2017 03:56 PM   Modules accepted: Orders

## 2017-09-26 NOTE — Progress Notes (Signed)
Cardiology Office Note:    Date:  09/26/2017   ID:  Eddie Kelly, DOB 02-24-1925, MRN [id number]  PCP:  [name], MD  Cardiologist:  Garwin Brothers, MD   Referring MD: [name], MD    ASSESSMENT:    1. Preoperative cardiovascular examination   2. Chronic atrial fibrillation (HCC)   3. Left inguinal hernia    PLAN:    In order of problems listed above:  1. Patient is in excellent health and in my opinion not at high risk for coronary events during the aforementioned surgery.  He has great effort tolerance for his age.  Meticulous hemodynamic monitoring and continued perioperative beta-blockade will further reduce the risk of coronary events.  I gave him an option of bridging with heparin for a time that he will hold his anticoagulation but is not keen on it.  I respect his wishes.  He can be taken off anticoagulation for a few days before the surgery as instructed by surgeon and resume it as soon as it safe for him to do so. 2. Echocardiogram will be done to assess murmur heard on auscultation. 3. Patient will be seen in follow-up appointment in 6 months or earlier if the patient has any concerns    Medication Adjustments/Labs and Tests Ordered: Current medicines are reviewed at length with the patient today.  Concerns regarding medicines are outlined above.  No orders of the defined types were placed in this encounter.  No orders of the defined types were placed in this encounter.    History of Present Illness:    Eddie Kelly is a 84 y.o. male who is being seen today for the evaluation of preop cardiovascular assessment At the request of [name], MD.  Patient is a pleasant 84 year old male.  He has past medical history of atrial fibrillation he is on anticoagulation and is very stable gentleman.  He has an excellent effort tolerance for his age.  He tells me that he goes around his driveway.  He has a circle driveway.  It is 150 steps.  He goes around it 12  times with no symptoms.  He tells me at that point he gets tired and stops walking.  At the time of my evaluation, the patient is alert awake oriented and in no distress.  His daughter-in-law accompanies him for this visit and she is very supportive.  Past Medical History:  Diagnosis Date  . A-fib (HCC)   . Anxiety and depression   . CAD (coronary artery disease)   . Hypothyroidism   . PVC (premature ventricular contraction)   . Sinus bradycardia   . Vitamin D deficiency     Past Surgical History:  Procedure Laterality Date  . HERNIA REPAIR      Current Medications: Current Meds  Medication Sig  . Cholecalciferol (VITAMIN D PO) Take 5,000 Units by mouth continuous dialysis.   Marland Kitchen cyanocobalamin (,VITAMIN B-12,) 1000 MCG/ML injection Inject 1,000 mcg into the muscle every 30 (thirty) days.  . fluticasone (KLS ALLER-FLO) 50 MCG/ACT nasal spray Place 1 spray into both nostrils daily.  . furosemide (LASIX) 20 MG tablet Take 20 mg by mouth daily.  Marland Kitchen levothyroxine (SYNTHROID, LEVOTHROID) 88 MCG tablet Take 88 mcg by mouth daily.  Marland Kitchen loratadine (CLARITIN) 10 MG tablet Take 10 mg by mouth daily.  Marland Kitchen losartan (COZAAR) 100 MG tablet Take 100 mg by mouth daily.  . metoprolol tartrate (LOPRESSOR) 25 MG tablet Take 25 mg by mouth 2 (two)  times daily.  . Misc Natural Products (PROSTATE HEALTH) CAPS Take 1 capsule by mouth daily.  . nitroGLYCERIN (NITROSTAT) 0.4 MG SL tablet Place 0.4 mg under the tongue every 5 (five) minutes as needed.  Carlena Hurl. XARELTO 20 MG TABS tablet TAKE 1 TABLET(20 MG) BY MOUTH DAILY WITH SUPPER     Allergies:   Patient has no known allergies.   Social History   Socioeconomic History  . Marital status: Married    Spouse name: Not on file  . Number of children: Not on file  . Years of education: Not on file  . Highest education level: Not on file  Occupational History  . Not on file  Social Needs  . Financial resource strain: Not on file  . Food insecurity:    Worry:  Not on file    Inability: Not on file  . Transportation needs:    Medical: Not on file    Non-medical: Not on file  Tobacco Use  . Smoking status: Former Smoker    Last attempt to quit: 1983    Years since quitting: 36.6  . Smokeless tobacco: Never Used  Substance and Sexual Activity  . Alcohol use: Yes    Alcohol/week: 0.6 oz    Types: 1 Cans of beer per week    Comment: "every once in a blue moon"  . Drug use: No  . Sexual activity: Not on file  Lifestyle  . Physical activity:    Days per week: Not on file    Minutes per session: Not on file  . Stress: Not on file  Relationships  . Social connections:    Talks on phone: Not on file    Gets together: Not on file    Attends religious service: Not on file    Active member of club or organization: Not on file    Attends meetings of clubs or organizations: Not on file    Relationship status: Not on file  Other Topics Concern  . Not on file  Social History Narrative  . Not on file     Family History: The patient's family history includes Cancer in his father; Diabetes in his brother; Heart attack in his father; Heart disease in his brother.  ROS:   Please see the history of present illness.    All other systems reviewed and are negative.  EKGs/Labs/Other Studies Reviewed:    The following studies were reviewed today: EKG reveals atrial fibrillation with nonspecific ST-T changes with   Recent Labs: No results found for requested labs within last 8760 hours.  Recent Lipid Panel No results found for: CHOL, TRIG, HDL, CHOLHDL, VLDL, LDLCALC, LDLDIRECT  Physical Exam:    VS:  BP (!) 174/98 (BP Location: Right Arm, Patient Position: Sitting, Cuff Size: Normal)   Pulse 78   Ht 6' (1.829 m)   Wt 201 lb (91.2 kg)   SpO2 98%   BMI 27.26 kg/m     Wt Readings from Last 3 Encounters:  09/26/17 201 lb (91.2 kg)  12/01/16 204 lb 1.9 oz (92.6 kg)     GEN: Patient is in no acute distress HEENT: Normal NECK: No JVD;  No carotid bruits LYMPHATICS: No lymphadenopathy CARDIAC: S1 S2 irregular, 2/6 systolic murmur at the apex. RESPIRATORY:  Clear to auscultation without rales, wheezing or rhonchi  ABDOMEN: Soft, non-tender, non-distended MUSCULOSKELETAL:  No edema; No deformity  SKIN: Warm and dry NEUROLOGIC:  Alert and oriented x 3 PSYCHIATRIC:  Normal affect  Signed, Garwin Brothers, MD  09/26/2017 3:42 PM    [facility]

## 2017-09-27 NOTE — Addendum Note (Signed)
Addended by: Carren RangGIPSON, Erlean Mealor on: 09/27/2017 10:04 AM   Modules accepted: Orders

## 2017-09-29 ENCOUNTER — Ambulatory Visit (INDEPENDENT_AMBULATORY_CARE_PROVIDER_SITE_OTHER): Payer: Medicare Other

## 2017-09-29 DIAGNOSIS — Z01810 Encounter for preprocedural cardiovascular examination: Secondary | ICD-10-CM | POA: Diagnosis not present

## 2017-09-29 DIAGNOSIS — I482 Chronic atrial fibrillation: Secondary | ICD-10-CM

## 2017-09-29 NOTE — Progress Notes (Signed)
Complete echocardiogram has been performed.  Jimmy Emanuella Nickle RDCS 

## 2017-10-02 DIAGNOSIS — Z01818 Encounter for other preprocedural examination: Secondary | ICD-10-CM | POA: Diagnosis not present

## 2017-10-02 DIAGNOSIS — I10 Essential (primary) hypertension: Secondary | ICD-10-CM | POA: Diagnosis not present

## 2017-10-02 DIAGNOSIS — I4891 Unspecified atrial fibrillation: Secondary | ICD-10-CM | POA: Diagnosis not present

## 2017-10-09 DIAGNOSIS — N390 Urinary tract infection, site not specified: Secondary | ICD-10-CM | POA: Diagnosis not present

## 2017-10-10 ENCOUNTER — Encounter: Payer: Self-pay | Admitting: *Deleted

## 2017-10-19 DIAGNOSIS — J309 Allergic rhinitis, unspecified: Secondary | ICD-10-CM | POA: Diagnosis not present

## 2017-10-19 DIAGNOSIS — R51 Headache: Secondary | ICD-10-CM | POA: Diagnosis not present

## 2017-10-19 DIAGNOSIS — N390 Urinary tract infection, site not specified: Secondary | ICD-10-CM | POA: Diagnosis not present

## 2017-10-26 DIAGNOSIS — R339 Retention of urine, unspecified: Secondary | ICD-10-CM | POA: Diagnosis not present

## 2017-11-09 DIAGNOSIS — E7800 Pure hypercholesterolemia, unspecified: Secondary | ICD-10-CM | POA: Diagnosis not present

## 2017-11-09 DIAGNOSIS — Z7901 Long term (current) use of anticoagulants: Secondary | ICD-10-CM | POA: Diagnosis not present

## 2017-11-09 DIAGNOSIS — I10 Essential (primary) hypertension: Secondary | ICD-10-CM | POA: Diagnosis not present

## 2017-11-09 DIAGNOSIS — E039 Hypothyroidism, unspecified: Secondary | ICD-10-CM | POA: Diagnosis not present

## 2017-11-09 DIAGNOSIS — Z7952 Long term (current) use of systemic steroids: Secondary | ICD-10-CM | POA: Diagnosis not present

## 2017-11-09 DIAGNOSIS — Z79899 Other long term (current) drug therapy: Secondary | ICD-10-CM | POA: Diagnosis not present

## 2017-11-09 DIAGNOSIS — Z7252 High risk homosexual behavior: Secondary | ICD-10-CM | POA: Diagnosis not present

## 2017-11-09 DIAGNOSIS — J181 Lobar pneumonia, unspecified organism: Secondary | ICD-10-CM | POA: Diagnosis not present

## 2017-11-09 DIAGNOSIS — Z87891 Personal history of nicotine dependence: Secondary | ICD-10-CM | POA: Diagnosis not present

## 2017-11-09 DIAGNOSIS — R7989 Other specified abnormal findings of blood chemistry: Secondary | ICD-10-CM | POA: Diagnosis not present

## 2017-11-09 DIAGNOSIS — I4891 Unspecified atrial fibrillation: Secondary | ICD-10-CM | POA: Diagnosis not present

## 2017-11-09 DIAGNOSIS — J440 Chronic obstructive pulmonary disease with acute lower respiratory infection: Secondary | ICD-10-CM | POA: Diagnosis not present

## 2017-11-09 DIAGNOSIS — R509 Fever, unspecified: Secondary | ICD-10-CM | POA: Diagnosis not present

## 2017-11-09 DIAGNOSIS — R0602 Shortness of breath: Secondary | ICD-10-CM | POA: Diagnosis not present

## 2017-11-10 DIAGNOSIS — J440 Chronic obstructive pulmonary disease with acute lower respiratory infection: Secondary | ICD-10-CM | POA: Diagnosis not present

## 2017-11-10 DIAGNOSIS — Z7252 High risk homosexual behavior: Secondary | ICD-10-CM | POA: Diagnosis not present

## 2017-11-10 DIAGNOSIS — R509 Fever, unspecified: Secondary | ICD-10-CM | POA: Diagnosis not present

## 2017-11-10 DIAGNOSIS — J181 Lobar pneumonia, unspecified organism: Secondary | ICD-10-CM | POA: Diagnosis not present

## 2017-11-15 DIAGNOSIS — K4090 Unilateral inguinal hernia, without obstruction or gangrene, not specified as recurrent: Secondary | ICD-10-CM | POA: Diagnosis not present

## 2017-11-20 DIAGNOSIS — G629 Polyneuropathy, unspecified: Secondary | ICD-10-CM | POA: Diagnosis not present

## 2017-11-20 DIAGNOSIS — J449 Chronic obstructive pulmonary disease, unspecified: Secondary | ICD-10-CM | POA: Diagnosis not present

## 2017-11-20 DIAGNOSIS — Z7901 Long term (current) use of anticoagulants: Secondary | ICD-10-CM | POA: Diagnosis not present

## 2017-11-20 DIAGNOSIS — J4520 Mild intermittent asthma, uncomplicated: Secondary | ICD-10-CM | POA: Diagnosis not present

## 2017-11-20 DIAGNOSIS — K4090 Unilateral inguinal hernia, without obstruction or gangrene, not specified as recurrent: Secondary | ICD-10-CM | POA: Diagnosis not present

## 2017-11-20 DIAGNOSIS — Z79899 Other long term (current) drug therapy: Secondary | ICD-10-CM | POA: Diagnosis not present

## 2017-11-20 DIAGNOSIS — I10 Essential (primary) hypertension: Secondary | ICD-10-CM | POA: Diagnosis not present

## 2017-11-20 DIAGNOSIS — D649 Anemia, unspecified: Secondary | ICD-10-CM | POA: Diagnosis not present

## 2017-11-20 DIAGNOSIS — E079 Disorder of thyroid, unspecified: Secondary | ICD-10-CM | POA: Diagnosis not present

## 2017-11-20 DIAGNOSIS — I2510 Atherosclerotic heart disease of native coronary artery without angina pectoris: Secondary | ICD-10-CM | POA: Diagnosis not present

## 2017-11-20 DIAGNOSIS — I491 Atrial premature depolarization: Secondary | ICD-10-CM | POA: Diagnosis not present

## 2017-11-20 DIAGNOSIS — E7800 Pure hypercholesterolemia, unspecified: Secondary | ICD-10-CM | POA: Diagnosis not present

## 2017-11-20 DIAGNOSIS — I482 Chronic atrial fibrillation: Secondary | ICD-10-CM | POA: Diagnosis not present

## 2017-11-20 DIAGNOSIS — E559 Vitamin D deficiency, unspecified: Secondary | ICD-10-CM | POA: Diagnosis not present

## 2017-11-20 DIAGNOSIS — Z87891 Personal history of nicotine dependence: Secondary | ICD-10-CM | POA: Diagnosis not present

## 2017-11-21 DIAGNOSIS — R339 Retention of urine, unspecified: Secondary | ICD-10-CM | POA: Diagnosis not present

## 2017-11-22 DIAGNOSIS — I10 Essential (primary) hypertension: Secondary | ICD-10-CM | POA: Diagnosis not present

## 2017-11-22 DIAGNOSIS — Z87891 Personal history of nicotine dependence: Secondary | ICD-10-CM | POA: Diagnosis not present

## 2017-11-22 DIAGNOSIS — I499 Cardiac arrhythmia, unspecified: Secondary | ICD-10-CM | POA: Diagnosis not present

## 2017-11-22 DIAGNOSIS — J9601 Acute respiratory failure with hypoxia: Secondary | ICD-10-CM | POA: Diagnosis not present

## 2017-11-22 DIAGNOSIS — D638 Anemia in other chronic diseases classified elsewhere: Secondary | ICD-10-CM | POA: Diagnosis not present

## 2017-11-22 DIAGNOSIS — M1990 Unspecified osteoarthritis, unspecified site: Secondary | ICD-10-CM | POA: Diagnosis not present

## 2017-11-22 DIAGNOSIS — Z881 Allergy status to other antibiotic agents status: Secondary | ICD-10-CM | POA: Diagnosis not present

## 2017-11-22 DIAGNOSIS — I4891 Unspecified atrial fibrillation: Secondary | ICD-10-CM | POA: Diagnosis not present

## 2017-11-22 DIAGNOSIS — E872 Acidosis: Secondary | ICD-10-CM | POA: Diagnosis not present

## 2017-11-22 DIAGNOSIS — Z79899 Other long term (current) drug therapy: Secondary | ICD-10-CM | POA: Diagnosis not present

## 2017-11-22 DIAGNOSIS — J189 Pneumonia, unspecified organism: Secondary | ICD-10-CM | POA: Diagnosis not present

## 2017-11-22 DIAGNOSIS — R000 Tachycardia, unspecified: Secondary | ICD-10-CM | POA: Diagnosis not present

## 2017-11-22 DIAGNOSIS — R0602 Shortness of breath: Secondary | ICD-10-CM | POA: Diagnosis not present

## 2017-11-22 DIAGNOSIS — J9691 Respiratory failure, unspecified with hypoxia: Secondary | ICD-10-CM | POA: Diagnosis not present

## 2017-11-22 DIAGNOSIS — J440 Chronic obstructive pulmonary disease with acute lower respiratory infection: Secondary | ICD-10-CM | POA: Diagnosis not present

## 2017-11-22 DIAGNOSIS — E7800 Pure hypercholesterolemia, unspecified: Secondary | ICD-10-CM | POA: Diagnosis not present

## 2017-11-22 DIAGNOSIS — R112 Nausea with vomiting, unspecified: Secondary | ICD-10-CM | POA: Diagnosis not present

## 2017-11-22 DIAGNOSIS — A419 Sepsis, unspecified organism: Secondary | ICD-10-CM | POA: Diagnosis not present

## 2017-11-22 DIAGNOSIS — E039 Hypothyroidism, unspecified: Secondary | ICD-10-CM | POA: Diagnosis not present

## 2017-11-22 DIAGNOSIS — J181 Lobar pneumonia, unspecified organism: Secondary | ICD-10-CM | POA: Diagnosis not present

## 2017-11-22 DIAGNOSIS — R0902 Hypoxemia: Secondary | ICD-10-CM | POA: Diagnosis not present

## 2017-11-22 DIAGNOSIS — Z7902 Long term (current) use of antithrombotics/antiplatelets: Secondary | ICD-10-CM | POA: Diagnosis not present

## 2017-11-29 DIAGNOSIS — Z09 Encounter for follow-up examination after completed treatment for conditions other than malignant neoplasm: Secondary | ICD-10-CM | POA: Insufficient documentation

## 2017-11-29 HISTORY — DX: Encounter for follow-up examination after completed treatment for conditions other than malignant neoplasm: Z09

## 2017-11-30 DIAGNOSIS — Z09 Encounter for follow-up examination after completed treatment for conditions other than malignant neoplasm: Secondary | ICD-10-CM | POA: Diagnosis not present

## 2017-11-30 DIAGNOSIS — Z23 Encounter for immunization: Secondary | ICD-10-CM | POA: Diagnosis not present

## 2017-11-30 DIAGNOSIS — J309 Allergic rhinitis, unspecified: Secondary | ICD-10-CM | POA: Diagnosis not present

## 2017-11-30 DIAGNOSIS — I10 Essential (primary) hypertension: Secondary | ICD-10-CM | POA: Diagnosis not present

## 2018-01-01 ENCOUNTER — Telehealth: Payer: Self-pay | Admitting: Cardiology

## 2018-01-01 NOTE — Telephone Encounter (Signed)
Patient informed that samples of xarelto 20 mg are available for pick up at the front desk in the [place] office. Patient verbalized understanding. No further questions.

## 2018-01-01 NOTE — Telephone Encounter (Signed)
Patient came by the office on Friday and requested samples of Xarelto, please call patient if we have.

## 2018-01-03 DIAGNOSIS — Z79899 Other long term (current) drug therapy: Secondary | ICD-10-CM | POA: Diagnosis not present

## 2018-01-03 DIAGNOSIS — E039 Hypothyroidism, unspecified: Secondary | ICD-10-CM | POA: Diagnosis not present

## 2018-01-03 DIAGNOSIS — E559 Vitamin D deficiency, unspecified: Secondary | ICD-10-CM | POA: Diagnosis not present

## 2018-01-03 DIAGNOSIS — I10 Essential (primary) hypertension: Secondary | ICD-10-CM | POA: Diagnosis not present

## 2018-02-28 DIAGNOSIS — R339 Retention of urine, unspecified: Secondary | ICD-10-CM | POA: Diagnosis not present

## 2018-04-02 DIAGNOSIS — E039 Hypothyroidism, unspecified: Secondary | ICD-10-CM | POA: Diagnosis not present

## 2018-04-02 DIAGNOSIS — E559 Vitamin D deficiency, unspecified: Secondary | ICD-10-CM | POA: Diagnosis not present

## 2018-04-02 DIAGNOSIS — E785 Hyperlipidemia, unspecified: Secondary | ICD-10-CM | POA: Diagnosis not present

## 2018-04-02 DIAGNOSIS — I10 Essential (primary) hypertension: Secondary | ICD-10-CM | POA: Diagnosis not present

## 2018-04-11 DIAGNOSIS — R339 Retention of urine, unspecified: Secondary | ICD-10-CM | POA: Diagnosis not present

## 2018-04-12 ENCOUNTER — Ambulatory Visit: Payer: Medicare Other | Admitting: Cardiology

## 2018-04-12 ENCOUNTER — Encounter: Payer: Self-pay | Admitting: Cardiology

## 2018-04-12 VITALS — BP 118/58 | HR 74 | Ht 72.0 in | Wt 204.0 lb

## 2018-04-12 DIAGNOSIS — I4891 Unspecified atrial fibrillation: Secondary | ICD-10-CM | POA: Diagnosis not present

## 2018-04-12 DIAGNOSIS — I10 Essential (primary) hypertension: Secondary | ICD-10-CM | POA: Insufficient documentation

## 2018-04-12 HISTORY — DX: Essential (primary) hypertension: I10

## 2018-04-12 NOTE — Progress Notes (Signed)
Cardiology Office Note:    Date:  04/12/2018   ID:  [name], DOB 07-29-1925, MRN [id number]  PCP:  [name], MD  Cardiologist:  Garwin Brothers, MD   Referring MD: [name], MD    ASSESSMENT:    1. Atrial fibrillation, unspecified type (HCC)   2. Essential hypertension    PLAN:    In order of problems listed above:  1. Primary prevention stressed with the patient.  Importance of compliance with diet and medication stressed. 2. His blood pressure is stable. 3. I discussed with the patient atrial fibrillation, disease process. Management and therapy including rate and rhythm control, anticoagulation benefits and potential risks were discussed extensively with the patient. Patient had multiple questions which were answered to patient's satisfaction. 4. Patient will be seen in follow-up appointment in 6 months or earlier if the patient has any concerns    Medication Adjustments/Labs and Tests Ordered: Current medicines are reviewed at length with the patient today.  Concerns regarding medicines are outlined above.  No orders of the defined types were placed in this encounter.  No orders of the defined types were placed in this encounter.    No chief complaint on file.    History of Present Illness:    Eddie Kelly is a 85 y.o. male.  Patient has history of essential hypertension and atrial fibrillation.  He is on anticoagulation.  He appears much younger than stated age.  He denies any chest pain orthopnea or PND.  He takes care of activities of daily living.  At the time of my evaluation, the patient is alert awake oriented and in no distress.  Past Medical History:  Diagnosis Date  . A-fib (HCC)   . Anxiety and depression   . CAD (coronary artery disease)   . Hypothyroidism   . PVC (premature ventricular contraction)   . Sinus bradycardia   . Vitamin D deficiency     Past Surgical History:  Procedure Laterality Date  . HERNIA REPAIR       Current Medications: Current Meds  Medication Sig  . amoxicillin-clavulanate (AUGMENTIN) 875-125 MG tablet Take 1 tablet by mouth 2 (two) times daily.  Marland Kitchen azithromycin (ZITHROMAX) 250 MG tablet Take 1 tablet by mouth daily.  . Cholecalciferol (VITAMIN D PO) Take 5,000 Units by mouth continuous dialysis.   Marland Kitchen cyanocobalamin (,VITAMIN B-12,) 1000 MCG/ML injection Inject 1,000 mcg into the muscle every 30 (thirty) days.  . fluticasone (KLS ALLER-FLO) 50 MCG/ACT nasal spray Place 1 spray into both nostrils daily.  . furosemide (LASIX) 20 MG tablet Take 20 mg by mouth daily.  Marland Kitchen levothyroxine (SYNTHROID, LEVOTHROID) 88 MCG tablet Take 88 mcg by mouth daily.  Marland Kitchen loratadine (CLARITIN) 10 MG tablet Take 10 mg by mouth daily.  Marland Kitchen losartan (COZAAR) 100 MG tablet Take 100 mg by mouth daily.  . metoprolol tartrate (LOPRESSOR) 25 MG tablet Take 25 mg by mouth 2 (two) times daily.  . Misc Natural Products (PROSTATE HEALTH) CAPS Take 1 capsule by mouth daily.  . nitroGLYCERIN (NITROSTAT) 0.4 MG SL tablet Place 0.4 mg under the tongue every 5 (five) minutes as needed.  Carlena Hurl 20 MG TABS tablet TAKE 1 TABLET(20 MG) BY MOUTH DAILY WITH SUPPER     Allergies:   Patient has no known allergies.   Social History   Socioeconomic History  . Marital status: Married    Spouse name: Not on file  . Number of children: Not on file  . Years  of education: Not on file  . Highest education level: Not on file  Occupational History  . Not on file  Social Needs  . Financial resource strain: Not on file  . Food insecurity:    Worry: Not on file    Inability: Not on file  . Transportation needs:    Medical: Not on file    Non-medical: Not on file  Tobacco Use  . Smoking status: Former Smoker    Last attempt to quit: 1983    Years since quitting: 37.1  . Smokeless tobacco: Never Used  Substance and Sexual Activity  . Alcohol use: Yes    Alcohol/week: 1.0 standard drinks    Types: 1 Cans of beer per week     Comment: "every once in a blue moon"  . Drug use: No  . Sexual activity: Not on file  Lifestyle  . Physical activity:    Days per week: Not on file    Minutes per session: Not on file  . Stress: Not on file  Relationships  . Social connections:    Talks on phone: Not on file    Gets together: Not on file    Attends religious service: Not on file    Active member of club or organization: Not on file    Attends meetings of clubs or organizations: Not on file    Relationship status: Not on file  Other Topics Concern  . Not on file  Social History Narrative  . Not on file     Family History: The patient's family history includes Cancer in his father; Diabetes in his brother; Heart attack in his father; Heart disease in his brother.  ROS:   Please see the history of present illness.    All other systems reviewed and are negative.  EKGs/Labs/Other Studies Reviewed:    The following studies were reviewed today: I discussed my findings with the patient at extensive length.   Recent Labs: No results found for requested labs within last 8760 hours.  Recent Lipid Panel No results found for: CHOL, TRIG, HDL, CHOLHDL, VLDL, LDLCALC, LDLDIRECT  Physical Exam:    VS:  BP (!) 118/58 (BP Location: Right Arm, Patient Position: Sitting, Cuff Size: Normal)   Pulse 74   Ht 6' (1.829 m)   Wt 204 lb (92.5 kg)   SpO2 99%   BMI 27.67 kg/m     Wt Readings from Last 3 Encounters:  04/12/18 204 lb (92.5 kg)  09/26/17 201 lb (91.2 kg)  12/01/16 204 lb 1.9 oz (92.6 kg)     GEN: Patient is in no acute distress HEENT: Normal NECK: No JVD; No carotid bruits LYMPHATICS: No lymphadenopathy CARDIAC: Hear sounds regular, 2/6 systolic murmur at the apex. RESPIRATORY:  Clear to auscultation without rales, wheezing or rhonchi  ABDOMEN: Soft, non-tender, non-distended MUSCULOSKELETAL:  No edema; No deformity  SKIN: Warm and dry NEUROLOGIC:  Alert and oriented x 3 PSYCHIATRIC:  Normal affect    Signed, Garwin Brothers, MD  04/12/2018 11:58 AM    [facility]

## 2018-04-12 NOTE — Patient Instructions (Signed)
Medication Instructions:   Your physician recommends that you continue on your current medications as directed. Please refer to the Current Medication list given to you today.   If you need a refill on your cardiac medications before your next appointment, please call your pharmacy.   Lab work:  NONE    Testing/Procedures:  NONE  Follow-Up: At [facility], you and your health needs are our priority.  As part of our continuing mission to provide you with exceptional heart care, we have created designated Provider Care Teams.  These Care Teams include your primary Cardiologist (physician) and Advanced Practice Providers (APPs -  Physician Assistants and Nurse Practitioners) who all work together to provide you with the care you need, when you need it.   You will need a follow up appointment in 9 months.

## 2018-05-02 DIAGNOSIS — Z0000 Encounter for general adult medical examination without abnormal findings: Secondary | ICD-10-CM | POA: Diagnosis not present

## 2018-05-02 DIAGNOSIS — E785 Hyperlipidemia, unspecified: Secondary | ICD-10-CM | POA: Diagnosis not present

## 2018-05-02 DIAGNOSIS — Z9181 History of falling: Secondary | ICD-10-CM | POA: Diagnosis not present

## 2018-07-25 DIAGNOSIS — E785 Hyperlipidemia, unspecified: Secondary | ICD-10-CM | POA: Diagnosis not present

## 2018-07-25 DIAGNOSIS — I4891 Unspecified atrial fibrillation: Secondary | ICD-10-CM | POA: Diagnosis not present

## 2018-07-25 DIAGNOSIS — J309 Allergic rhinitis, unspecified: Secondary | ICD-10-CM | POA: Diagnosis not present

## 2018-07-25 DIAGNOSIS — I10 Essential (primary) hypertension: Secondary | ICD-10-CM | POA: Diagnosis not present

## 2018-07-25 DIAGNOSIS — E039 Hypothyroidism, unspecified: Secondary | ICD-10-CM | POA: Diagnosis not present

## 2018-08-12 DIAGNOSIS — J189 Pneumonia, unspecified organism: Secondary | ICD-10-CM | POA: Diagnosis not present

## 2018-08-12 DIAGNOSIS — J449 Chronic obstructive pulmonary disease, unspecified: Secondary | ICD-10-CM | POA: Diagnosis not present

## 2018-08-12 DIAGNOSIS — Z79899 Other long term (current) drug therapy: Secondary | ICD-10-CM | POA: Diagnosis not present

## 2018-08-12 DIAGNOSIS — I6523 Occlusion and stenosis of bilateral carotid arteries: Secondary | ICD-10-CM | POA: Diagnosis not present

## 2018-08-12 DIAGNOSIS — J9601 Acute respiratory failure with hypoxia: Secondary | ICD-10-CM | POA: Diagnosis not present

## 2018-08-12 DIAGNOSIS — R0600 Dyspnea, unspecified: Secondary | ICD-10-CM | POA: Diagnosis not present

## 2018-08-12 DIAGNOSIS — I517 Cardiomegaly: Secondary | ICD-10-CM | POA: Diagnosis not present

## 2018-08-12 DIAGNOSIS — Z87891 Personal history of nicotine dependence: Secondary | ICD-10-CM | POA: Diagnosis not present

## 2018-08-12 DIAGNOSIS — J441 Chronic obstructive pulmonary disease with (acute) exacerbation: Secondary | ICD-10-CM | POA: Diagnosis not present

## 2018-08-12 DIAGNOSIS — Z7902 Long term (current) use of antithrombotics/antiplatelets: Secondary | ICD-10-CM | POA: Diagnosis not present

## 2018-08-12 DIAGNOSIS — I4891 Unspecified atrial fibrillation: Secondary | ICD-10-CM | POA: Diagnosis not present

## 2018-08-12 DIAGNOSIS — Z66 Do not resuscitate: Secondary | ICD-10-CM | POA: Diagnosis not present

## 2018-08-12 DIAGNOSIS — E7800 Pure hypercholesterolemia, unspecified: Secondary | ICD-10-CM | POA: Diagnosis not present

## 2018-08-12 DIAGNOSIS — J690 Pneumonitis due to inhalation of food and vomit: Secondary | ICD-10-CM | POA: Diagnosis not present

## 2018-08-12 DIAGNOSIS — Z881 Allergy status to other antibiotic agents status: Secondary | ICD-10-CM | POA: Diagnosis not present

## 2018-08-12 DIAGNOSIS — I10 Essential (primary) hypertension: Secondary | ICD-10-CM | POA: Diagnosis not present

## 2018-08-12 DIAGNOSIS — I482 Chronic atrial fibrillation, unspecified: Secondary | ICD-10-CM | POA: Diagnosis not present

## 2018-08-12 DIAGNOSIS — R0602 Shortness of breath: Secondary | ICD-10-CM | POA: Diagnosis not present

## 2018-08-12 DIAGNOSIS — M1990 Unspecified osteoarthritis, unspecified site: Secondary | ICD-10-CM | POA: Diagnosis not present

## 2018-08-12 DIAGNOSIS — R069 Unspecified abnormalities of breathing: Secondary | ICD-10-CM | POA: Diagnosis not present

## 2018-08-21 ENCOUNTER — Other Ambulatory Visit: Payer: Self-pay

## 2018-08-21 NOTE — Patient Outreach (Signed)
Requested additional EMMI prevent info to be mailed.

## 2018-08-22 DIAGNOSIS — R29898 Other symptoms and signs involving the musculoskeletal system: Secondary | ICD-10-CM | POA: Diagnosis not present

## 2018-08-22 DIAGNOSIS — I10 Essential (primary) hypertension: Secondary | ICD-10-CM | POA: Diagnosis not present

## 2018-08-22 DIAGNOSIS — Z79899 Other long term (current) drug therapy: Secondary | ICD-10-CM | POA: Diagnosis not present

## 2018-08-22 DIAGNOSIS — Z09 Encounter for follow-up examination after completed treatment for conditions other than malignant neoplasm: Secondary | ICD-10-CM | POA: Diagnosis not present

## 2018-08-25 DIAGNOSIS — J189 Pneumonia, unspecified organism: Secondary | ICD-10-CM | POA: Diagnosis not present

## 2018-08-25 DIAGNOSIS — R001 Bradycardia, unspecified: Secondary | ICD-10-CM | POA: Diagnosis not present

## 2018-08-25 DIAGNOSIS — J440 Chronic obstructive pulmonary disease with acute lower respiratory infection: Secondary | ICD-10-CM | POA: Diagnosis not present

## 2018-08-25 DIAGNOSIS — I493 Ventricular premature depolarization: Secondary | ICD-10-CM | POA: Diagnosis not present

## 2018-08-25 DIAGNOSIS — Z7901 Long term (current) use of anticoagulants: Secondary | ICD-10-CM | POA: Diagnosis not present

## 2018-08-25 DIAGNOSIS — K589 Irritable bowel syndrome without diarrhea: Secondary | ICD-10-CM | POA: Diagnosis not present

## 2018-08-25 DIAGNOSIS — M545 Low back pain: Secondary | ICD-10-CM | POA: Diagnosis not present

## 2018-08-25 DIAGNOSIS — E039 Hypothyroidism, unspecified: Secondary | ICD-10-CM | POA: Diagnosis not present

## 2018-08-25 DIAGNOSIS — G629 Polyneuropathy, unspecified: Secondary | ICD-10-CM | POA: Diagnosis not present

## 2018-08-25 DIAGNOSIS — I2510 Atherosclerotic heart disease of native coronary artery without angina pectoris: Secondary | ICD-10-CM | POA: Diagnosis not present

## 2018-08-25 DIAGNOSIS — D510 Vitamin B12 deficiency anemia due to intrinsic factor deficiency: Secondary | ICD-10-CM | POA: Diagnosis not present

## 2018-08-25 DIAGNOSIS — M1990 Unspecified osteoarthritis, unspecified site: Secondary | ICD-10-CM | POA: Diagnosis not present

## 2018-08-25 DIAGNOSIS — I10 Essential (primary) hypertension: Secondary | ICD-10-CM | POA: Diagnosis not present

## 2018-08-25 DIAGNOSIS — R413 Other amnesia: Secondary | ICD-10-CM | POA: Diagnosis not present

## 2018-08-25 DIAGNOSIS — I4891 Unspecified atrial fibrillation: Secondary | ICD-10-CM | POA: Diagnosis not present

## 2018-08-25 DIAGNOSIS — J690 Pneumonitis due to inhalation of food and vomit: Secondary | ICD-10-CM | POA: Diagnosis not present

## 2018-08-25 DIAGNOSIS — N289 Disorder of kidney and ureter, unspecified: Secondary | ICD-10-CM | POA: Diagnosis not present

## 2018-08-27 DIAGNOSIS — K589 Irritable bowel syndrome without diarrhea: Secondary | ICD-10-CM | POA: Diagnosis not present

## 2018-08-27 DIAGNOSIS — R739 Hyperglycemia, unspecified: Secondary | ICD-10-CM | POA: Diagnosis not present

## 2018-08-27 DIAGNOSIS — M545 Low back pain: Secondary | ICD-10-CM | POA: Diagnosis not present

## 2018-08-27 DIAGNOSIS — J690 Pneumonitis due to inhalation of food and vomit: Secondary | ICD-10-CM | POA: Diagnosis not present

## 2018-08-27 DIAGNOSIS — M1990 Unspecified osteoarthritis, unspecified site: Secondary | ICD-10-CM | POA: Diagnosis not present

## 2018-08-27 DIAGNOSIS — R5383 Other fatigue: Secondary | ICD-10-CM | POA: Diagnosis not present

## 2018-08-27 DIAGNOSIS — N289 Disorder of kidney and ureter, unspecified: Secondary | ICD-10-CM | POA: Diagnosis not present

## 2018-08-27 DIAGNOSIS — I4891 Unspecified atrial fibrillation: Secondary | ICD-10-CM | POA: Diagnosis not present

## 2018-08-27 DIAGNOSIS — J189 Pneumonia, unspecified organism: Secondary | ICD-10-CM | POA: Diagnosis not present

## 2018-08-27 DIAGNOSIS — G629 Polyneuropathy, unspecified: Secondary | ICD-10-CM | POA: Diagnosis not present

## 2018-08-27 DIAGNOSIS — I493 Ventricular premature depolarization: Secondary | ICD-10-CM | POA: Diagnosis not present

## 2018-08-27 DIAGNOSIS — D510 Vitamin B12 deficiency anemia due to intrinsic factor deficiency: Secondary | ICD-10-CM | POA: Diagnosis not present

## 2018-08-27 DIAGNOSIS — R413 Other amnesia: Secondary | ICD-10-CM | POA: Diagnosis not present

## 2018-08-27 DIAGNOSIS — E559 Vitamin D deficiency, unspecified: Secondary | ICD-10-CM | POA: Diagnosis not present

## 2018-08-27 DIAGNOSIS — J440 Chronic obstructive pulmonary disease with acute lower respiratory infection: Secondary | ICD-10-CM | POA: Diagnosis not present

## 2018-08-27 DIAGNOSIS — E785 Hyperlipidemia, unspecified: Secondary | ICD-10-CM | POA: Diagnosis not present

## 2018-08-27 DIAGNOSIS — Z7901 Long term (current) use of anticoagulants: Secondary | ICD-10-CM | POA: Diagnosis not present

## 2018-08-27 DIAGNOSIS — I10 Essential (primary) hypertension: Secondary | ICD-10-CM | POA: Diagnosis not present

## 2018-08-27 DIAGNOSIS — E039 Hypothyroidism, unspecified: Secondary | ICD-10-CM | POA: Diagnosis not present

## 2018-08-27 DIAGNOSIS — R001 Bradycardia, unspecified: Secondary | ICD-10-CM | POA: Diagnosis not present

## 2018-08-27 DIAGNOSIS — I2510 Atherosclerotic heart disease of native coronary artery without angina pectoris: Secondary | ICD-10-CM | POA: Diagnosis not present

## 2018-08-29 DIAGNOSIS — G629 Polyneuropathy, unspecified: Secondary | ICD-10-CM | POA: Diagnosis not present

## 2018-08-29 DIAGNOSIS — I10 Essential (primary) hypertension: Secondary | ICD-10-CM | POA: Diagnosis not present

## 2018-08-29 DIAGNOSIS — J690 Pneumonitis due to inhalation of food and vomit: Secondary | ICD-10-CM | POA: Diagnosis not present

## 2018-08-29 DIAGNOSIS — N289 Disorder of kidney and ureter, unspecified: Secondary | ICD-10-CM | POA: Diagnosis not present

## 2018-08-29 DIAGNOSIS — I4891 Unspecified atrial fibrillation: Secondary | ICD-10-CM | POA: Diagnosis not present

## 2018-08-29 DIAGNOSIS — E039 Hypothyroidism, unspecified: Secondary | ICD-10-CM | POA: Diagnosis not present

## 2018-08-29 DIAGNOSIS — R001 Bradycardia, unspecified: Secondary | ICD-10-CM | POA: Diagnosis not present

## 2018-08-29 DIAGNOSIS — Z7901 Long term (current) use of anticoagulants: Secondary | ICD-10-CM | POA: Diagnosis not present

## 2018-08-29 DIAGNOSIS — I493 Ventricular premature depolarization: Secondary | ICD-10-CM | POA: Diagnosis not present

## 2018-08-29 DIAGNOSIS — K589 Irritable bowel syndrome without diarrhea: Secondary | ICD-10-CM | POA: Diagnosis not present

## 2018-08-29 DIAGNOSIS — J440 Chronic obstructive pulmonary disease with acute lower respiratory infection: Secondary | ICD-10-CM | POA: Diagnosis not present

## 2018-08-29 DIAGNOSIS — J189 Pneumonia, unspecified organism: Secondary | ICD-10-CM | POA: Diagnosis not present

## 2018-08-29 DIAGNOSIS — D510 Vitamin B12 deficiency anemia due to intrinsic factor deficiency: Secondary | ICD-10-CM | POA: Diagnosis not present

## 2018-08-29 DIAGNOSIS — I2510 Atherosclerotic heart disease of native coronary artery without angina pectoris: Secondary | ICD-10-CM | POA: Diagnosis not present

## 2018-08-29 DIAGNOSIS — M545 Low back pain: Secondary | ICD-10-CM | POA: Diagnosis not present

## 2018-08-29 DIAGNOSIS — R413 Other amnesia: Secondary | ICD-10-CM | POA: Diagnosis not present

## 2018-08-29 DIAGNOSIS — M1990 Unspecified osteoarthritis, unspecified site: Secondary | ICD-10-CM | POA: Diagnosis not present

## 2018-09-05 DIAGNOSIS — I493 Ventricular premature depolarization: Secondary | ICD-10-CM | POA: Diagnosis not present

## 2018-09-05 DIAGNOSIS — Z7901 Long term (current) use of anticoagulants: Secondary | ICD-10-CM | POA: Diagnosis not present

## 2018-09-05 DIAGNOSIS — M545 Low back pain: Secondary | ICD-10-CM | POA: Diagnosis not present

## 2018-09-05 DIAGNOSIS — E039 Hypothyroidism, unspecified: Secondary | ICD-10-CM | POA: Diagnosis not present

## 2018-09-05 DIAGNOSIS — R001 Bradycardia, unspecified: Secondary | ICD-10-CM | POA: Diagnosis not present

## 2018-09-05 DIAGNOSIS — J189 Pneumonia, unspecified organism: Secondary | ICD-10-CM | POA: Diagnosis not present

## 2018-09-05 DIAGNOSIS — J440 Chronic obstructive pulmonary disease with acute lower respiratory infection: Secondary | ICD-10-CM | POA: Diagnosis not present

## 2018-09-05 DIAGNOSIS — I4891 Unspecified atrial fibrillation: Secondary | ICD-10-CM | POA: Diagnosis not present

## 2018-09-05 DIAGNOSIS — M1990 Unspecified osteoarthritis, unspecified site: Secondary | ICD-10-CM | POA: Diagnosis not present

## 2018-09-05 DIAGNOSIS — N289 Disorder of kidney and ureter, unspecified: Secondary | ICD-10-CM | POA: Diagnosis not present

## 2018-09-05 DIAGNOSIS — I2510 Atherosclerotic heart disease of native coronary artery without angina pectoris: Secondary | ICD-10-CM | POA: Diagnosis not present

## 2018-09-05 DIAGNOSIS — D510 Vitamin B12 deficiency anemia due to intrinsic factor deficiency: Secondary | ICD-10-CM | POA: Diagnosis not present

## 2018-09-05 DIAGNOSIS — G629 Polyneuropathy, unspecified: Secondary | ICD-10-CM | POA: Diagnosis not present

## 2018-09-05 DIAGNOSIS — I10 Essential (primary) hypertension: Secondary | ICD-10-CM | POA: Diagnosis not present

## 2018-09-05 DIAGNOSIS — R413 Other amnesia: Secondary | ICD-10-CM | POA: Diagnosis not present

## 2018-09-05 DIAGNOSIS — J690 Pneumonitis due to inhalation of food and vomit: Secondary | ICD-10-CM | POA: Diagnosis not present

## 2018-09-05 DIAGNOSIS — K589 Irritable bowel syndrome without diarrhea: Secondary | ICD-10-CM | POA: Diagnosis not present

## 2018-09-12 DIAGNOSIS — I10 Essential (primary) hypertension: Secondary | ICD-10-CM | POA: Diagnosis not present

## 2018-09-12 DIAGNOSIS — R413 Other amnesia: Secondary | ICD-10-CM | POA: Diagnosis not present

## 2018-09-12 DIAGNOSIS — M545 Low back pain: Secondary | ICD-10-CM | POA: Diagnosis not present

## 2018-09-12 DIAGNOSIS — K589 Irritable bowel syndrome without diarrhea: Secondary | ICD-10-CM | POA: Diagnosis not present

## 2018-09-12 DIAGNOSIS — E039 Hypothyroidism, unspecified: Secondary | ICD-10-CM | POA: Diagnosis not present

## 2018-09-12 DIAGNOSIS — M1990 Unspecified osteoarthritis, unspecified site: Secondary | ICD-10-CM | POA: Diagnosis not present

## 2018-09-12 DIAGNOSIS — J440 Chronic obstructive pulmonary disease with acute lower respiratory infection: Secondary | ICD-10-CM | POA: Diagnosis not present

## 2018-09-12 DIAGNOSIS — J189 Pneumonia, unspecified organism: Secondary | ICD-10-CM | POA: Diagnosis not present

## 2018-09-12 DIAGNOSIS — I493 Ventricular premature depolarization: Secondary | ICD-10-CM | POA: Diagnosis not present

## 2018-09-12 DIAGNOSIS — R001 Bradycardia, unspecified: Secondary | ICD-10-CM | POA: Diagnosis not present

## 2018-09-12 DIAGNOSIS — J690 Pneumonitis due to inhalation of food and vomit: Secondary | ICD-10-CM | POA: Diagnosis not present

## 2018-09-12 DIAGNOSIS — I4891 Unspecified atrial fibrillation: Secondary | ICD-10-CM | POA: Diagnosis not present

## 2018-09-12 DIAGNOSIS — D510 Vitamin B12 deficiency anemia due to intrinsic factor deficiency: Secondary | ICD-10-CM | POA: Diagnosis not present

## 2018-09-12 DIAGNOSIS — I2510 Atherosclerotic heart disease of native coronary artery without angina pectoris: Secondary | ICD-10-CM | POA: Diagnosis not present

## 2018-09-12 DIAGNOSIS — N289 Disorder of kidney and ureter, unspecified: Secondary | ICD-10-CM | POA: Diagnosis not present

## 2018-09-12 DIAGNOSIS — G629 Polyneuropathy, unspecified: Secondary | ICD-10-CM | POA: Diagnosis not present

## 2018-09-12 DIAGNOSIS — Z7901 Long term (current) use of anticoagulants: Secondary | ICD-10-CM | POA: Diagnosis not present

## 2018-10-10 DIAGNOSIS — R339 Retention of urine, unspecified: Secondary | ICD-10-CM | POA: Diagnosis not present

## 2018-10-10 DIAGNOSIS — J984 Other disorders of lung: Secondary | ICD-10-CM | POA: Diagnosis not present

## 2018-10-10 DIAGNOSIS — R7989 Other specified abnormal findings of blood chemistry: Secondary | ICD-10-CM | POA: Diagnosis not present

## 2018-10-10 DIAGNOSIS — I4891 Unspecified atrial fibrillation: Secondary | ICD-10-CM | POA: Diagnosis not present

## 2018-10-10 DIAGNOSIS — R042 Hemoptysis: Secondary | ICD-10-CM | POA: Diagnosis not present

## 2018-10-10 DIAGNOSIS — J690 Pneumonitis due to inhalation of food and vomit: Secondary | ICD-10-CM | POA: Diagnosis not present

## 2018-10-10 DIAGNOSIS — J439 Emphysema, unspecified: Secondary | ICD-10-CM | POA: Diagnosis not present

## 2018-10-11 DIAGNOSIS — J439 Emphysema, unspecified: Secondary | ICD-10-CM | POA: Diagnosis not present

## 2018-10-11 DIAGNOSIS — I4821 Permanent atrial fibrillation: Secondary | ICD-10-CM | POA: Diagnosis not present

## 2018-10-11 DIAGNOSIS — J690 Pneumonitis due to inhalation of food and vomit: Secondary | ICD-10-CM | POA: Diagnosis not present

## 2018-10-11 DIAGNOSIS — E039 Hypothyroidism, unspecified: Secondary | ICD-10-CM | POA: Diagnosis not present

## 2018-10-11 DIAGNOSIS — R7989 Other specified abnormal findings of blood chemistry: Secondary | ICD-10-CM | POA: Diagnosis not present

## 2018-10-11 DIAGNOSIS — Z66 Do not resuscitate: Secondary | ICD-10-CM | POA: Diagnosis not present

## 2018-10-11 DIAGNOSIS — Z7902 Long term (current) use of antithrombotics/antiplatelets: Secondary | ICD-10-CM | POA: Diagnosis not present

## 2018-10-11 DIAGNOSIS — R1312 Dysphagia, oropharyngeal phase: Secondary | ICD-10-CM | POA: Diagnosis not present

## 2018-10-11 DIAGNOSIS — Z87891 Personal history of nicotine dependence: Secondary | ICD-10-CM | POA: Diagnosis not present

## 2018-10-11 DIAGNOSIS — J449 Chronic obstructive pulmonary disease, unspecified: Secondary | ICD-10-CM | POA: Diagnosis not present

## 2018-10-11 DIAGNOSIS — J441 Chronic obstructive pulmonary disease with (acute) exacerbation: Secondary | ICD-10-CM | POA: Diagnosis not present

## 2018-10-11 DIAGNOSIS — J984 Other disorders of lung: Secondary | ICD-10-CM | POA: Diagnosis not present

## 2018-10-11 DIAGNOSIS — R042 Hemoptysis: Secondary | ICD-10-CM | POA: Diagnosis not present

## 2018-10-11 DIAGNOSIS — N179 Acute kidney failure, unspecified: Secondary | ICD-10-CM | POA: Diagnosis not present

## 2018-10-11 DIAGNOSIS — Z79899 Other long term (current) drug therapy: Secondary | ICD-10-CM | POA: Diagnosis not present

## 2018-10-11 DIAGNOSIS — I10 Essential (primary) hypertension: Secondary | ICD-10-CM | POA: Diagnosis not present

## 2018-10-11 DIAGNOSIS — J9601 Acute respiratory failure with hypoxia: Secondary | ICD-10-CM | POA: Diagnosis not present

## 2018-10-11 DIAGNOSIS — Z881 Allergy status to other antibiotic agents status: Secondary | ICD-10-CM | POA: Diagnosis not present

## 2018-10-11 DIAGNOSIS — I4891 Unspecified atrial fibrillation: Secondary | ICD-10-CM | POA: Diagnosis not present

## 2018-10-12 DIAGNOSIS — J9601 Acute respiratory failure with hypoxia: Secondary | ICD-10-CM | POA: Diagnosis not present

## 2018-10-12 DIAGNOSIS — J449 Chronic obstructive pulmonary disease, unspecified: Secondary | ICD-10-CM | POA: Diagnosis not present

## 2018-10-12 DIAGNOSIS — I4891 Unspecified atrial fibrillation: Secondary | ICD-10-CM | POA: Diagnosis not present

## 2018-10-12 DIAGNOSIS — J690 Pneumonitis due to inhalation of food and vomit: Secondary | ICD-10-CM | POA: Diagnosis not present

## 2018-10-13 DIAGNOSIS — J449 Chronic obstructive pulmonary disease, unspecified: Secondary | ICD-10-CM | POA: Diagnosis not present

## 2018-10-13 DIAGNOSIS — I4891 Unspecified atrial fibrillation: Secondary | ICD-10-CM | POA: Diagnosis not present

## 2018-10-13 DIAGNOSIS — J690 Pneumonitis due to inhalation of food and vomit: Secondary | ICD-10-CM | POA: Diagnosis not present

## 2018-10-13 DIAGNOSIS — J9601 Acute respiratory failure with hypoxia: Secondary | ICD-10-CM | POA: Diagnosis not present

## 2018-10-14 DIAGNOSIS — J449 Chronic obstructive pulmonary disease, unspecified: Secondary | ICD-10-CM | POA: Diagnosis not present

## 2018-10-14 DIAGNOSIS — J9601 Acute respiratory failure with hypoxia: Secondary | ICD-10-CM | POA: Diagnosis not present

## 2018-10-14 DIAGNOSIS — J690 Pneumonitis due to inhalation of food and vomit: Secondary | ICD-10-CM | POA: Diagnosis not present

## 2018-10-14 DIAGNOSIS — I4891 Unspecified atrial fibrillation: Secondary | ICD-10-CM | POA: Diagnosis not present

## 2018-10-16 DIAGNOSIS — E7800 Pure hypercholesterolemia, unspecified: Secondary | ICD-10-CM | POA: Diagnosis not present

## 2018-10-16 DIAGNOSIS — I10 Essential (primary) hypertension: Secondary | ICD-10-CM | POA: Diagnosis not present

## 2018-10-16 DIAGNOSIS — J441 Chronic obstructive pulmonary disease with (acute) exacerbation: Secondary | ICD-10-CM | POA: Diagnosis not present

## 2018-10-16 DIAGNOSIS — E039 Hypothyroidism, unspecified: Secondary | ICD-10-CM | POA: Diagnosis not present

## 2018-10-16 DIAGNOSIS — Z7902 Long term (current) use of antithrombotics/antiplatelets: Secondary | ICD-10-CM | POA: Diagnosis not present

## 2018-10-16 DIAGNOSIS — J690 Pneumonitis due to inhalation of food and vomit: Secondary | ICD-10-CM | POA: Diagnosis not present

## 2018-10-16 DIAGNOSIS — Z66 Do not resuscitate: Secondary | ICD-10-CM | POA: Diagnosis not present

## 2018-10-16 DIAGNOSIS — R042 Hemoptysis: Secondary | ICD-10-CM | POA: Diagnosis not present

## 2018-10-16 DIAGNOSIS — R1312 Dysphagia, oropharyngeal phase: Secondary | ICD-10-CM | POA: Diagnosis not present

## 2018-10-16 DIAGNOSIS — I2510 Atherosclerotic heart disease of native coronary artery without angina pectoris: Secondary | ICD-10-CM | POA: Diagnosis not present

## 2018-10-16 DIAGNOSIS — R2681 Unsteadiness on feet: Secondary | ICD-10-CM | POA: Diagnosis not present

## 2018-10-16 DIAGNOSIS — G629 Polyneuropathy, unspecified: Secondary | ICD-10-CM | POA: Diagnosis not present

## 2018-10-16 DIAGNOSIS — M1990 Unspecified osteoarthritis, unspecified site: Secondary | ICD-10-CM | POA: Diagnosis not present

## 2018-10-16 DIAGNOSIS — Z87891 Personal history of nicotine dependence: Secondary | ICD-10-CM | POA: Diagnosis not present

## 2018-10-16 DIAGNOSIS — Z881 Allergy status to other antibiotic agents status: Secondary | ICD-10-CM | POA: Diagnosis not present

## 2018-10-16 DIAGNOSIS — Z79899 Other long term (current) drug therapy: Secondary | ICD-10-CM | POA: Diagnosis not present

## 2018-10-16 DIAGNOSIS — Z792 Long term (current) use of antibiotics: Secondary | ICD-10-CM | POA: Diagnosis not present

## 2018-10-16 DIAGNOSIS — Z7901 Long term (current) use of anticoagulants: Secondary | ICD-10-CM | POA: Diagnosis not present

## 2018-10-16 DIAGNOSIS — I4821 Permanent atrial fibrillation: Secondary | ICD-10-CM | POA: Diagnosis not present

## 2018-10-22 DIAGNOSIS — R042 Hemoptysis: Secondary | ICD-10-CM | POA: Diagnosis not present

## 2018-10-22 DIAGNOSIS — Z87891 Personal history of nicotine dependence: Secondary | ICD-10-CM | POA: Diagnosis not present

## 2018-10-22 DIAGNOSIS — E039 Hypothyroidism, unspecified: Secondary | ICD-10-CM | POA: Diagnosis not present

## 2018-10-22 DIAGNOSIS — Z7902 Long term (current) use of antithrombotics/antiplatelets: Secondary | ICD-10-CM | POA: Diagnosis not present

## 2018-10-22 DIAGNOSIS — Z792 Long term (current) use of antibiotics: Secondary | ICD-10-CM | POA: Diagnosis not present

## 2018-10-22 DIAGNOSIS — I4821 Permanent atrial fibrillation: Secondary | ICD-10-CM | POA: Diagnosis not present

## 2018-10-22 DIAGNOSIS — I2510 Atherosclerotic heart disease of native coronary artery without angina pectoris: Secondary | ICD-10-CM | POA: Diagnosis not present

## 2018-10-22 DIAGNOSIS — Z7901 Long term (current) use of anticoagulants: Secondary | ICD-10-CM | POA: Diagnosis not present

## 2018-10-22 DIAGNOSIS — R2681 Unsteadiness on feet: Secondary | ICD-10-CM | POA: Diagnosis not present

## 2018-10-22 DIAGNOSIS — G629 Polyneuropathy, unspecified: Secondary | ICD-10-CM | POA: Diagnosis not present

## 2018-10-22 DIAGNOSIS — Z66 Do not resuscitate: Secondary | ICD-10-CM | POA: Diagnosis not present

## 2018-10-22 DIAGNOSIS — E7800 Pure hypercholesterolemia, unspecified: Secondary | ICD-10-CM | POA: Diagnosis not present

## 2018-10-22 DIAGNOSIS — J690 Pneumonitis due to inhalation of food and vomit: Secondary | ICD-10-CM | POA: Diagnosis not present

## 2018-10-22 DIAGNOSIS — J441 Chronic obstructive pulmonary disease with (acute) exacerbation: Secondary | ICD-10-CM | POA: Diagnosis not present

## 2018-10-22 DIAGNOSIS — Z79899 Other long term (current) drug therapy: Secondary | ICD-10-CM | POA: Diagnosis not present

## 2018-10-22 DIAGNOSIS — M1990 Unspecified osteoarthritis, unspecified site: Secondary | ICD-10-CM | POA: Diagnosis not present

## 2018-10-22 DIAGNOSIS — R1312 Dysphagia, oropharyngeal phase: Secondary | ICD-10-CM | POA: Diagnosis not present

## 2018-10-22 DIAGNOSIS — I10 Essential (primary) hypertension: Secondary | ICD-10-CM | POA: Diagnosis not present

## 2018-10-22 DIAGNOSIS — Z881 Allergy status to other antibiotic agents status: Secondary | ICD-10-CM | POA: Diagnosis not present

## 2018-10-23 DIAGNOSIS — Z139 Encounter for screening, unspecified: Secondary | ICD-10-CM | POA: Diagnosis not present

## 2018-10-23 DIAGNOSIS — Z77120 Contact with and (suspected) exposure to mold (toxic): Secondary | ICD-10-CM | POA: Diagnosis not present

## 2018-10-23 DIAGNOSIS — Z09 Encounter for follow-up examination after completed treatment for conditions other than malignant neoplasm: Secondary | ICD-10-CM | POA: Diagnosis not present

## 2018-10-23 DIAGNOSIS — Z79899 Other long term (current) drug therapy: Secondary | ICD-10-CM | POA: Diagnosis not present

## 2018-10-23 DIAGNOSIS — I10 Essential (primary) hypertension: Secondary | ICD-10-CM | POA: Diagnosis not present

## 2018-10-31 DIAGNOSIS — I10 Essential (primary) hypertension: Secondary | ICD-10-CM | POA: Diagnosis not present

## 2018-10-31 DIAGNOSIS — R2681 Unsteadiness on feet: Secondary | ICD-10-CM | POA: Diagnosis not present

## 2018-10-31 DIAGNOSIS — E039 Hypothyroidism, unspecified: Secondary | ICD-10-CM | POA: Diagnosis not present

## 2018-10-31 DIAGNOSIS — Z792 Long term (current) use of antibiotics: Secondary | ICD-10-CM | POA: Diagnosis not present

## 2018-10-31 DIAGNOSIS — Z7901 Long term (current) use of anticoagulants: Secondary | ICD-10-CM | POA: Diagnosis not present

## 2018-10-31 DIAGNOSIS — Z79899 Other long term (current) drug therapy: Secondary | ICD-10-CM | POA: Diagnosis not present

## 2018-10-31 DIAGNOSIS — I2510 Atherosclerotic heart disease of native coronary artery without angina pectoris: Secondary | ICD-10-CM | POA: Diagnosis not present

## 2018-10-31 DIAGNOSIS — G629 Polyneuropathy, unspecified: Secondary | ICD-10-CM | POA: Diagnosis not present

## 2018-10-31 DIAGNOSIS — J690 Pneumonitis due to inhalation of food and vomit: Secondary | ICD-10-CM | POA: Diagnosis not present

## 2018-10-31 DIAGNOSIS — E7800 Pure hypercholesterolemia, unspecified: Secondary | ICD-10-CM | POA: Diagnosis not present

## 2018-10-31 DIAGNOSIS — Z87891 Personal history of nicotine dependence: Secondary | ICD-10-CM | POA: Diagnosis not present

## 2018-10-31 DIAGNOSIS — J441 Chronic obstructive pulmonary disease with (acute) exacerbation: Secondary | ICD-10-CM | POA: Diagnosis not present

## 2018-10-31 DIAGNOSIS — M1990 Unspecified osteoarthritis, unspecified site: Secondary | ICD-10-CM | POA: Diagnosis not present

## 2018-10-31 DIAGNOSIS — I4821 Permanent atrial fibrillation: Secondary | ICD-10-CM | POA: Diagnosis not present

## 2018-10-31 DIAGNOSIS — Z66 Do not resuscitate: Secondary | ICD-10-CM | POA: Diagnosis not present

## 2018-10-31 DIAGNOSIS — Z881 Allergy status to other antibiotic agents status: Secondary | ICD-10-CM | POA: Diagnosis not present

## 2018-10-31 DIAGNOSIS — R1312 Dysphagia, oropharyngeal phase: Secondary | ICD-10-CM | POA: Diagnosis not present

## 2018-10-31 DIAGNOSIS — Z7902 Long term (current) use of antithrombotics/antiplatelets: Secondary | ICD-10-CM | POA: Diagnosis not present

## 2018-10-31 DIAGNOSIS — R042 Hemoptysis: Secondary | ICD-10-CM | POA: Diagnosis not present

## 2018-11-02 DIAGNOSIS — G629 Polyneuropathy, unspecified: Secondary | ICD-10-CM | POA: Diagnosis not present

## 2018-11-02 DIAGNOSIS — Z87891 Personal history of nicotine dependence: Secondary | ICD-10-CM | POA: Diagnosis not present

## 2018-11-02 DIAGNOSIS — I2510 Atherosclerotic heart disease of native coronary artery without angina pectoris: Secondary | ICD-10-CM | POA: Diagnosis not present

## 2018-11-02 DIAGNOSIS — I10 Essential (primary) hypertension: Secondary | ICD-10-CM | POA: Diagnosis not present

## 2018-11-02 DIAGNOSIS — M1990 Unspecified osteoarthritis, unspecified site: Secondary | ICD-10-CM | POA: Diagnosis not present

## 2018-11-02 DIAGNOSIS — I4821 Permanent atrial fibrillation: Secondary | ICD-10-CM | POA: Diagnosis not present

## 2018-11-02 DIAGNOSIS — R2681 Unsteadiness on feet: Secondary | ICD-10-CM | POA: Diagnosis not present

## 2018-11-02 DIAGNOSIS — Z7901 Long term (current) use of anticoagulants: Secondary | ICD-10-CM | POA: Diagnosis not present

## 2018-11-02 DIAGNOSIS — E7800 Pure hypercholesterolemia, unspecified: Secondary | ICD-10-CM | POA: Diagnosis not present

## 2018-11-02 DIAGNOSIS — R1312 Dysphagia, oropharyngeal phase: Secondary | ICD-10-CM | POA: Diagnosis not present

## 2018-11-02 DIAGNOSIS — R042 Hemoptysis: Secondary | ICD-10-CM | POA: Diagnosis not present

## 2018-11-02 DIAGNOSIS — Z881 Allergy status to other antibiotic agents status: Secondary | ICD-10-CM | POA: Diagnosis not present

## 2018-11-02 DIAGNOSIS — J441 Chronic obstructive pulmonary disease with (acute) exacerbation: Secondary | ICD-10-CM | POA: Diagnosis not present

## 2018-11-02 DIAGNOSIS — Z7902 Long term (current) use of antithrombotics/antiplatelets: Secondary | ICD-10-CM | POA: Diagnosis not present

## 2018-11-02 DIAGNOSIS — J690 Pneumonitis due to inhalation of food and vomit: Secondary | ICD-10-CM | POA: Diagnosis not present

## 2018-11-02 DIAGNOSIS — E039 Hypothyroidism, unspecified: Secondary | ICD-10-CM | POA: Diagnosis not present

## 2018-11-02 DIAGNOSIS — Z792 Long term (current) use of antibiotics: Secondary | ICD-10-CM | POA: Diagnosis not present

## 2018-11-02 DIAGNOSIS — Z79899 Other long term (current) drug therapy: Secondary | ICD-10-CM | POA: Diagnosis not present

## 2018-11-02 DIAGNOSIS — Z66 Do not resuscitate: Secondary | ICD-10-CM | POA: Diagnosis not present

## 2018-11-05 DIAGNOSIS — J441 Chronic obstructive pulmonary disease with (acute) exacerbation: Secondary | ICD-10-CM | POA: Diagnosis not present

## 2018-11-05 DIAGNOSIS — Z7901 Long term (current) use of anticoagulants: Secondary | ICD-10-CM | POA: Diagnosis not present

## 2018-11-05 DIAGNOSIS — I2510 Atherosclerotic heart disease of native coronary artery without angina pectoris: Secondary | ICD-10-CM | POA: Diagnosis not present

## 2018-11-05 DIAGNOSIS — Z79899 Other long term (current) drug therapy: Secondary | ICD-10-CM | POA: Diagnosis not present

## 2018-11-05 DIAGNOSIS — I10 Essential (primary) hypertension: Secondary | ICD-10-CM | POA: Diagnosis not present

## 2018-11-05 DIAGNOSIS — Z7902 Long term (current) use of antithrombotics/antiplatelets: Secondary | ICD-10-CM | POA: Diagnosis not present

## 2018-11-05 DIAGNOSIS — J690 Pneumonitis due to inhalation of food and vomit: Secondary | ICD-10-CM | POA: Diagnosis not present

## 2018-11-05 DIAGNOSIS — G629 Polyneuropathy, unspecified: Secondary | ICD-10-CM | POA: Diagnosis not present

## 2018-11-05 DIAGNOSIS — Z66 Do not resuscitate: Secondary | ICD-10-CM | POA: Diagnosis not present

## 2018-11-05 DIAGNOSIS — R2681 Unsteadiness on feet: Secondary | ICD-10-CM | POA: Diagnosis not present

## 2018-11-05 DIAGNOSIS — R1312 Dysphagia, oropharyngeal phase: Secondary | ICD-10-CM | POA: Diagnosis not present

## 2018-11-05 DIAGNOSIS — M1990 Unspecified osteoarthritis, unspecified site: Secondary | ICD-10-CM | POA: Diagnosis not present

## 2018-11-05 DIAGNOSIS — I4821 Permanent atrial fibrillation: Secondary | ICD-10-CM | POA: Diagnosis not present

## 2018-11-05 DIAGNOSIS — E7800 Pure hypercholesterolemia, unspecified: Secondary | ICD-10-CM | POA: Diagnosis not present

## 2018-11-05 DIAGNOSIS — Z792 Long term (current) use of antibiotics: Secondary | ICD-10-CM | POA: Diagnosis not present

## 2018-11-05 DIAGNOSIS — Z881 Allergy status to other antibiotic agents status: Secondary | ICD-10-CM | POA: Diagnosis not present

## 2018-11-05 DIAGNOSIS — Z87891 Personal history of nicotine dependence: Secondary | ICD-10-CM | POA: Diagnosis not present

## 2018-11-05 DIAGNOSIS — R042 Hemoptysis: Secondary | ICD-10-CM | POA: Diagnosis not present

## 2018-11-05 DIAGNOSIS — E039 Hypothyroidism, unspecified: Secondary | ICD-10-CM | POA: Diagnosis not present

## 2018-11-07 DIAGNOSIS — Z66 Do not resuscitate: Secondary | ICD-10-CM | POA: Diagnosis not present

## 2018-11-07 DIAGNOSIS — G629 Polyneuropathy, unspecified: Secondary | ICD-10-CM | POA: Diagnosis not present

## 2018-11-07 DIAGNOSIS — M1990 Unspecified osteoarthritis, unspecified site: Secondary | ICD-10-CM | POA: Diagnosis not present

## 2018-11-07 DIAGNOSIS — E039 Hypothyroidism, unspecified: Secondary | ICD-10-CM | POA: Diagnosis not present

## 2018-11-07 DIAGNOSIS — R1312 Dysphagia, oropharyngeal phase: Secondary | ICD-10-CM | POA: Diagnosis not present

## 2018-11-07 DIAGNOSIS — Z7902 Long term (current) use of antithrombotics/antiplatelets: Secondary | ICD-10-CM | POA: Diagnosis not present

## 2018-11-07 DIAGNOSIS — I10 Essential (primary) hypertension: Secondary | ICD-10-CM | POA: Diagnosis not present

## 2018-11-07 DIAGNOSIS — R2681 Unsteadiness on feet: Secondary | ICD-10-CM | POA: Diagnosis not present

## 2018-11-07 DIAGNOSIS — J441 Chronic obstructive pulmonary disease with (acute) exacerbation: Secondary | ICD-10-CM | POA: Diagnosis not present

## 2018-11-07 DIAGNOSIS — Z79899 Other long term (current) drug therapy: Secondary | ICD-10-CM | POA: Diagnosis not present

## 2018-11-07 DIAGNOSIS — I4821 Permanent atrial fibrillation: Secondary | ICD-10-CM | POA: Diagnosis not present

## 2018-11-07 DIAGNOSIS — Z7901 Long term (current) use of anticoagulants: Secondary | ICD-10-CM | POA: Diagnosis not present

## 2018-11-07 DIAGNOSIS — I2510 Atherosclerotic heart disease of native coronary artery without angina pectoris: Secondary | ICD-10-CM | POA: Diagnosis not present

## 2018-11-07 DIAGNOSIS — Z881 Allergy status to other antibiotic agents status: Secondary | ICD-10-CM | POA: Diagnosis not present

## 2018-11-07 DIAGNOSIS — J690 Pneumonitis due to inhalation of food and vomit: Secondary | ICD-10-CM | POA: Diagnosis not present

## 2018-11-07 DIAGNOSIS — R042 Hemoptysis: Secondary | ICD-10-CM | POA: Diagnosis not present

## 2018-11-07 DIAGNOSIS — Z792 Long term (current) use of antibiotics: Secondary | ICD-10-CM | POA: Diagnosis not present

## 2018-11-07 DIAGNOSIS — Z87891 Personal history of nicotine dependence: Secondary | ICD-10-CM | POA: Diagnosis not present

## 2018-11-07 DIAGNOSIS — E7800 Pure hypercholesterolemia, unspecified: Secondary | ICD-10-CM | POA: Diagnosis not present

## 2018-11-13 DIAGNOSIS — M1990 Unspecified osteoarthritis, unspecified site: Secondary | ICD-10-CM | POA: Diagnosis not present

## 2018-11-13 DIAGNOSIS — Z881 Allergy status to other antibiotic agents status: Secondary | ICD-10-CM | POA: Diagnosis not present

## 2018-11-13 DIAGNOSIS — I2510 Atherosclerotic heart disease of native coronary artery without angina pectoris: Secondary | ICD-10-CM | POA: Diagnosis not present

## 2018-11-13 DIAGNOSIS — I10 Essential (primary) hypertension: Secondary | ICD-10-CM | POA: Diagnosis not present

## 2018-11-13 DIAGNOSIS — R042 Hemoptysis: Secondary | ICD-10-CM | POA: Diagnosis not present

## 2018-11-13 DIAGNOSIS — G629 Polyneuropathy, unspecified: Secondary | ICD-10-CM | POA: Diagnosis not present

## 2018-11-13 DIAGNOSIS — Z7901 Long term (current) use of anticoagulants: Secondary | ICD-10-CM | POA: Diagnosis not present

## 2018-11-13 DIAGNOSIS — R1312 Dysphagia, oropharyngeal phase: Secondary | ICD-10-CM | POA: Diagnosis not present

## 2018-11-13 DIAGNOSIS — E7800 Pure hypercholesterolemia, unspecified: Secondary | ICD-10-CM | POA: Diagnosis not present

## 2018-11-13 DIAGNOSIS — E039 Hypothyroidism, unspecified: Secondary | ICD-10-CM | POA: Diagnosis not present

## 2018-11-13 DIAGNOSIS — R2681 Unsteadiness on feet: Secondary | ICD-10-CM | POA: Diagnosis not present

## 2018-11-13 DIAGNOSIS — Z87891 Personal history of nicotine dependence: Secondary | ICD-10-CM | POA: Diagnosis not present

## 2018-11-13 DIAGNOSIS — I4821 Permanent atrial fibrillation: Secondary | ICD-10-CM | POA: Diagnosis not present

## 2018-11-13 DIAGNOSIS — Z79899 Other long term (current) drug therapy: Secondary | ICD-10-CM | POA: Diagnosis not present

## 2018-11-13 DIAGNOSIS — Z792 Long term (current) use of antibiotics: Secondary | ICD-10-CM | POA: Diagnosis not present

## 2018-11-13 DIAGNOSIS — J441 Chronic obstructive pulmonary disease with (acute) exacerbation: Secondary | ICD-10-CM | POA: Diagnosis not present

## 2018-11-13 DIAGNOSIS — J690 Pneumonitis due to inhalation of food and vomit: Secondary | ICD-10-CM | POA: Diagnosis not present

## 2018-11-13 DIAGNOSIS — Z66 Do not resuscitate: Secondary | ICD-10-CM | POA: Diagnosis not present

## 2018-11-13 DIAGNOSIS — Z7902 Long term (current) use of antithrombotics/antiplatelets: Secondary | ICD-10-CM | POA: Diagnosis not present

## 2018-11-14 ENCOUNTER — Other Ambulatory Visit: Payer: Self-pay | Admitting: Cardiology

## 2018-11-14 DIAGNOSIS — Z79899 Other long term (current) drug therapy: Secondary | ICD-10-CM | POA: Diagnosis not present

## 2018-11-14 DIAGNOSIS — Z7901 Long term (current) use of anticoagulants: Secondary | ICD-10-CM | POA: Diagnosis not present

## 2018-11-14 DIAGNOSIS — M1990 Unspecified osteoarthritis, unspecified site: Secondary | ICD-10-CM | POA: Diagnosis not present

## 2018-11-14 DIAGNOSIS — I4821 Permanent atrial fibrillation: Secondary | ICD-10-CM | POA: Diagnosis not present

## 2018-11-14 DIAGNOSIS — Z7902 Long term (current) use of antithrombotics/antiplatelets: Secondary | ICD-10-CM | POA: Diagnosis not present

## 2018-11-14 DIAGNOSIS — E039 Hypothyroidism, unspecified: Secondary | ICD-10-CM | POA: Diagnosis not present

## 2018-11-14 DIAGNOSIS — I10 Essential (primary) hypertension: Secondary | ICD-10-CM | POA: Diagnosis not present

## 2018-11-14 DIAGNOSIS — J690 Pneumonitis due to inhalation of food and vomit: Secondary | ICD-10-CM | POA: Diagnosis not present

## 2018-11-14 DIAGNOSIS — E7800 Pure hypercholesterolemia, unspecified: Secondary | ICD-10-CM | POA: Diagnosis not present

## 2018-11-14 DIAGNOSIS — Z881 Allergy status to other antibiotic agents status: Secondary | ICD-10-CM | POA: Diagnosis not present

## 2018-11-14 DIAGNOSIS — G629 Polyneuropathy, unspecified: Secondary | ICD-10-CM | POA: Diagnosis not present

## 2018-11-14 DIAGNOSIS — Z66 Do not resuscitate: Secondary | ICD-10-CM | POA: Diagnosis not present

## 2018-11-14 DIAGNOSIS — R1312 Dysphagia, oropharyngeal phase: Secondary | ICD-10-CM | POA: Diagnosis not present

## 2018-11-14 DIAGNOSIS — R2681 Unsteadiness on feet: Secondary | ICD-10-CM | POA: Diagnosis not present

## 2018-11-14 DIAGNOSIS — Z792 Long term (current) use of antibiotics: Secondary | ICD-10-CM | POA: Diagnosis not present

## 2018-11-14 DIAGNOSIS — E785 Hyperlipidemia, unspecified: Secondary | ICD-10-CM | POA: Diagnosis not present

## 2018-11-14 DIAGNOSIS — R1310 Dysphagia, unspecified: Secondary | ICD-10-CM | POA: Diagnosis not present

## 2018-11-14 DIAGNOSIS — Z87891 Personal history of nicotine dependence: Secondary | ICD-10-CM | POA: Diagnosis not present

## 2018-11-14 DIAGNOSIS — R042 Hemoptysis: Secondary | ICD-10-CM | POA: Diagnosis not present

## 2018-11-14 DIAGNOSIS — E559 Vitamin D deficiency, unspecified: Secondary | ICD-10-CM | POA: Diagnosis not present

## 2018-11-14 DIAGNOSIS — I4891 Unspecified atrial fibrillation: Secondary | ICD-10-CM

## 2018-11-14 DIAGNOSIS — J441 Chronic obstructive pulmonary disease with (acute) exacerbation: Secondary | ICD-10-CM | POA: Diagnosis not present

## 2018-11-14 DIAGNOSIS — I2510 Atherosclerotic heart disease of native coronary artery without angina pectoris: Secondary | ICD-10-CM | POA: Diagnosis not present

## 2018-11-19 DIAGNOSIS — J690 Pneumonitis due to inhalation of food and vomit: Secondary | ICD-10-CM | POA: Diagnosis not present

## 2018-11-19 DIAGNOSIS — Z87891 Personal history of nicotine dependence: Secondary | ICD-10-CM | POA: Diagnosis not present

## 2018-11-19 DIAGNOSIS — I4821 Permanent atrial fibrillation: Secondary | ICD-10-CM | POA: Diagnosis not present

## 2018-11-19 DIAGNOSIS — R042 Hemoptysis: Secondary | ICD-10-CM | POA: Diagnosis not present

## 2018-11-19 DIAGNOSIS — G629 Polyneuropathy, unspecified: Secondary | ICD-10-CM | POA: Diagnosis not present

## 2018-11-19 DIAGNOSIS — M1990 Unspecified osteoarthritis, unspecified site: Secondary | ICD-10-CM | POA: Diagnosis not present

## 2018-11-19 DIAGNOSIS — Z7902 Long term (current) use of antithrombotics/antiplatelets: Secondary | ICD-10-CM | POA: Diagnosis not present

## 2018-11-19 DIAGNOSIS — E7800 Pure hypercholesterolemia, unspecified: Secondary | ICD-10-CM | POA: Diagnosis not present

## 2018-11-19 DIAGNOSIS — E039 Hypothyroidism, unspecified: Secondary | ICD-10-CM | POA: Diagnosis not present

## 2018-11-19 DIAGNOSIS — Z66 Do not resuscitate: Secondary | ICD-10-CM | POA: Diagnosis not present

## 2018-11-19 DIAGNOSIS — I10 Essential (primary) hypertension: Secondary | ICD-10-CM | POA: Diagnosis not present

## 2018-11-19 DIAGNOSIS — J441 Chronic obstructive pulmonary disease with (acute) exacerbation: Secondary | ICD-10-CM | POA: Diagnosis not present

## 2018-11-19 DIAGNOSIS — I2510 Atherosclerotic heart disease of native coronary artery without angina pectoris: Secondary | ICD-10-CM | POA: Diagnosis not present

## 2018-11-19 DIAGNOSIS — Z79899 Other long term (current) drug therapy: Secondary | ICD-10-CM | POA: Diagnosis not present

## 2018-11-19 DIAGNOSIS — Z881 Allergy status to other antibiotic agents status: Secondary | ICD-10-CM | POA: Diagnosis not present

## 2018-11-19 DIAGNOSIS — R2681 Unsteadiness on feet: Secondary | ICD-10-CM | POA: Diagnosis not present

## 2018-11-19 DIAGNOSIS — R1312 Dysphagia, oropharyngeal phase: Secondary | ICD-10-CM | POA: Diagnosis not present

## 2018-11-19 DIAGNOSIS — Z792 Long term (current) use of antibiotics: Secondary | ICD-10-CM | POA: Diagnosis not present

## 2018-11-19 DIAGNOSIS — Z7901 Long term (current) use of anticoagulants: Secondary | ICD-10-CM | POA: Diagnosis not present

## 2018-11-26 DIAGNOSIS — Z881 Allergy status to other antibiotic agents status: Secondary | ICD-10-CM | POA: Diagnosis not present

## 2018-11-26 DIAGNOSIS — M1990 Unspecified osteoarthritis, unspecified site: Secondary | ICD-10-CM | POA: Diagnosis not present

## 2018-11-26 DIAGNOSIS — J690 Pneumonitis due to inhalation of food and vomit: Secondary | ICD-10-CM | POA: Diagnosis not present

## 2018-11-26 DIAGNOSIS — Z7902 Long term (current) use of antithrombotics/antiplatelets: Secondary | ICD-10-CM | POA: Diagnosis not present

## 2018-11-26 DIAGNOSIS — I4821 Permanent atrial fibrillation: Secondary | ICD-10-CM | POA: Diagnosis not present

## 2018-11-26 DIAGNOSIS — Z7901 Long term (current) use of anticoagulants: Secondary | ICD-10-CM | POA: Diagnosis not present

## 2018-11-26 DIAGNOSIS — E7800 Pure hypercholesterolemia, unspecified: Secondary | ICD-10-CM | POA: Diagnosis not present

## 2018-11-26 DIAGNOSIS — G629 Polyneuropathy, unspecified: Secondary | ICD-10-CM | POA: Diagnosis not present

## 2018-11-26 DIAGNOSIS — J441 Chronic obstructive pulmonary disease with (acute) exacerbation: Secondary | ICD-10-CM | POA: Diagnosis not present

## 2018-11-26 DIAGNOSIS — Z79899 Other long term (current) drug therapy: Secondary | ICD-10-CM | POA: Diagnosis not present

## 2018-11-26 DIAGNOSIS — Z792 Long term (current) use of antibiotics: Secondary | ICD-10-CM | POA: Diagnosis not present

## 2018-11-26 DIAGNOSIS — R042 Hemoptysis: Secondary | ICD-10-CM | POA: Diagnosis not present

## 2018-11-26 DIAGNOSIS — Z87891 Personal history of nicotine dependence: Secondary | ICD-10-CM | POA: Diagnosis not present

## 2018-11-26 DIAGNOSIS — E039 Hypothyroidism, unspecified: Secondary | ICD-10-CM | POA: Diagnosis not present

## 2018-11-26 DIAGNOSIS — R2681 Unsteadiness on feet: Secondary | ICD-10-CM | POA: Diagnosis not present

## 2018-11-26 DIAGNOSIS — I10 Essential (primary) hypertension: Secondary | ICD-10-CM | POA: Diagnosis not present

## 2018-11-26 DIAGNOSIS — Z66 Do not resuscitate: Secondary | ICD-10-CM | POA: Diagnosis not present

## 2018-11-26 DIAGNOSIS — R1312 Dysphagia, oropharyngeal phase: Secondary | ICD-10-CM | POA: Diagnosis not present

## 2018-11-26 DIAGNOSIS — I2510 Atherosclerotic heart disease of native coronary artery without angina pectoris: Secondary | ICD-10-CM | POA: Diagnosis not present

## 2018-11-30 DIAGNOSIS — Z23 Encounter for immunization: Secondary | ICD-10-CM | POA: Diagnosis not present

## 2018-12-03 DIAGNOSIS — E7800 Pure hypercholesterolemia, unspecified: Secondary | ICD-10-CM | POA: Diagnosis not present

## 2018-12-03 DIAGNOSIS — Z792 Long term (current) use of antibiotics: Secondary | ICD-10-CM | POA: Diagnosis not present

## 2018-12-03 DIAGNOSIS — R2681 Unsteadiness on feet: Secondary | ICD-10-CM | POA: Diagnosis not present

## 2018-12-03 DIAGNOSIS — I10 Essential (primary) hypertension: Secondary | ICD-10-CM | POA: Diagnosis not present

## 2018-12-03 DIAGNOSIS — R042 Hemoptysis: Secondary | ICD-10-CM | POA: Diagnosis not present

## 2018-12-03 DIAGNOSIS — M1990 Unspecified osteoarthritis, unspecified site: Secondary | ICD-10-CM | POA: Diagnosis not present

## 2018-12-03 DIAGNOSIS — I2510 Atherosclerotic heart disease of native coronary artery without angina pectoris: Secondary | ICD-10-CM | POA: Diagnosis not present

## 2018-12-03 DIAGNOSIS — Z66 Do not resuscitate: Secondary | ICD-10-CM | POA: Diagnosis not present

## 2018-12-03 DIAGNOSIS — J690 Pneumonitis due to inhalation of food and vomit: Secondary | ICD-10-CM | POA: Diagnosis not present

## 2018-12-03 DIAGNOSIS — R1312 Dysphagia, oropharyngeal phase: Secondary | ICD-10-CM | POA: Diagnosis not present

## 2018-12-03 DIAGNOSIS — I4821 Permanent atrial fibrillation: Secondary | ICD-10-CM | POA: Diagnosis not present

## 2018-12-03 DIAGNOSIS — J441 Chronic obstructive pulmonary disease with (acute) exacerbation: Secondary | ICD-10-CM | POA: Diagnosis not present

## 2018-12-03 DIAGNOSIS — G629 Polyneuropathy, unspecified: Secondary | ICD-10-CM | POA: Diagnosis not present

## 2018-12-03 DIAGNOSIS — Z7902 Long term (current) use of antithrombotics/antiplatelets: Secondary | ICD-10-CM | POA: Diagnosis not present

## 2018-12-03 DIAGNOSIS — Z7901 Long term (current) use of anticoagulants: Secondary | ICD-10-CM | POA: Diagnosis not present

## 2018-12-03 DIAGNOSIS — E039 Hypothyroidism, unspecified: Secondary | ICD-10-CM | POA: Diagnosis not present

## 2018-12-03 DIAGNOSIS — Z881 Allergy status to other antibiotic agents status: Secondary | ICD-10-CM | POA: Diagnosis not present

## 2018-12-03 DIAGNOSIS — Z87891 Personal history of nicotine dependence: Secondary | ICD-10-CM | POA: Diagnosis not present

## 2018-12-03 DIAGNOSIS — Z79899 Other long term (current) drug therapy: Secondary | ICD-10-CM | POA: Diagnosis not present

## 2018-12-13 DIAGNOSIS — Z7901 Long term (current) use of anticoagulants: Secondary | ICD-10-CM | POA: Diagnosis not present

## 2018-12-13 DIAGNOSIS — I4821 Permanent atrial fibrillation: Secondary | ICD-10-CM | POA: Diagnosis not present

## 2018-12-13 DIAGNOSIS — Z881 Allergy status to other antibiotic agents status: Secondary | ICD-10-CM | POA: Diagnosis not present

## 2018-12-13 DIAGNOSIS — E039 Hypothyroidism, unspecified: Secondary | ICD-10-CM | POA: Diagnosis not present

## 2018-12-13 DIAGNOSIS — G629 Polyneuropathy, unspecified: Secondary | ICD-10-CM | POA: Diagnosis not present

## 2018-12-13 DIAGNOSIS — Z87891 Personal history of nicotine dependence: Secondary | ICD-10-CM | POA: Diagnosis not present

## 2018-12-13 DIAGNOSIS — H3530 Unspecified macular degeneration: Secondary | ICD-10-CM | POA: Diagnosis not present

## 2018-12-13 DIAGNOSIS — E7800 Pure hypercholesterolemia, unspecified: Secondary | ICD-10-CM | POA: Diagnosis not present

## 2018-12-13 DIAGNOSIS — Z66 Do not resuscitate: Secondary | ICD-10-CM | POA: Diagnosis not present

## 2018-12-13 DIAGNOSIS — J690 Pneumonitis due to inhalation of food and vomit: Secondary | ICD-10-CM | POA: Diagnosis not present

## 2018-12-13 DIAGNOSIS — R2681 Unsteadiness on feet: Secondary | ICD-10-CM | POA: Diagnosis not present

## 2018-12-13 DIAGNOSIS — Z79899 Other long term (current) drug therapy: Secondary | ICD-10-CM | POA: Diagnosis not present

## 2018-12-13 DIAGNOSIS — Z7902 Long term (current) use of antithrombotics/antiplatelets: Secondary | ICD-10-CM | POA: Diagnosis not present

## 2018-12-13 DIAGNOSIS — M1990 Unspecified osteoarthritis, unspecified site: Secondary | ICD-10-CM | POA: Diagnosis not present

## 2018-12-13 DIAGNOSIS — I2510 Atherosclerotic heart disease of native coronary artery without angina pectoris: Secondary | ICD-10-CM | POA: Diagnosis not present

## 2018-12-13 DIAGNOSIS — H353131 Nonexudative age-related macular degeneration, bilateral, early dry stage: Secondary | ICD-10-CM | POA: Diagnosis not present

## 2018-12-13 DIAGNOSIS — J441 Chronic obstructive pulmonary disease with (acute) exacerbation: Secondary | ICD-10-CM | POA: Diagnosis not present

## 2018-12-13 DIAGNOSIS — I10 Essential (primary) hypertension: Secondary | ICD-10-CM | POA: Diagnosis not present

## 2018-12-13 DIAGNOSIS — Z792 Long term (current) use of antibiotics: Secondary | ICD-10-CM | POA: Diagnosis not present

## 2018-12-13 DIAGNOSIS — R1312 Dysphagia, oropharyngeal phase: Secondary | ICD-10-CM | POA: Diagnosis not present

## 2018-12-13 DIAGNOSIS — R042 Hemoptysis: Secondary | ICD-10-CM | POA: Diagnosis not present

## 2019-01-08 DIAGNOSIS — R2243 Localized swelling, mass and lump, lower limb, bilateral: Secondary | ICD-10-CM | POA: Diagnosis not present

## 2019-01-08 DIAGNOSIS — R05 Cough: Secondary | ICD-10-CM | POA: Diagnosis not present

## 2019-01-08 DIAGNOSIS — J309 Allergic rhinitis, unspecified: Secondary | ICD-10-CM | POA: Diagnosis not present

## 2019-01-08 DIAGNOSIS — R1310 Dysphagia, unspecified: Secondary | ICD-10-CM | POA: Diagnosis not present

## 2019-01-08 DIAGNOSIS — I10 Essential (primary) hypertension: Secondary | ICD-10-CM | POA: Diagnosis not present

## 2019-01-14 ENCOUNTER — Other Ambulatory Visit: Payer: Self-pay

## 2019-01-14 ENCOUNTER — Ambulatory Visit (INDEPENDENT_AMBULATORY_CARE_PROVIDER_SITE_OTHER): Payer: Medicare Other | Admitting: Cardiology

## 2019-01-14 ENCOUNTER — Encounter: Payer: Self-pay | Admitting: Cardiology

## 2019-01-14 VITALS — BP 158/76 | HR 60 | Ht 72.0 in | Wt 203.0 lb

## 2019-01-14 DIAGNOSIS — I10 Essential (primary) hypertension: Secondary | ICD-10-CM

## 2019-01-14 DIAGNOSIS — I4821 Permanent atrial fibrillation: Secondary | ICD-10-CM | POA: Insufficient documentation

## 2019-01-14 HISTORY — DX: Permanent atrial fibrillation: I48.21

## 2019-01-14 NOTE — Patient Instructions (Signed)
Medication Instructions:  Your physician recommends that you continue on your current medications as directed. Please refer to the Current Medication list given to you today.  *If you need a refill on your cardiac medications before your next appointment, please call your pharmacy*  Lab Work: Your physician recommends that you return for lab work today: lipids, tsh, vitamin d, cmp, cbc, urinalysis   If you have labs (blood work) drawn today and your tests are completely normal, you will receive your results only by: Marland Kitchen MyChart Message (if you have MyChart) OR . A paper copy in the mail If you have any lab test that is abnormal or we need to change your treatment, we will call you to review the results.  Testing/Procedures: None.   Follow-Up: At [facility], you and your health needs are our priority.  As part of our continuing mission to provide you with exceptional heart care, we have created designated Provider Care Teams.  These Care Teams include your primary Cardiologist (physician) and Advanced Practice Providers (APPs -  Physician Assistants and Nurse Practitioners) who all work together to provide you with the care you need, when you need it.  Your next appointment:   6 month(s)  The format for your next appointment:   In Person  Provider:   Jyl Heinz, MD  Other Instructions

## 2019-01-14 NOTE — Progress Notes (Signed)
Cardiology Office Note:    Date:  01/14/2019   ID:  Eddie Kelly, DOB 1925-10-03, MRN [id number]  PCP:  Nicholos Johns, MD  Cardiologist:  Jenean Lindau, MD   Referring MD: Nicholos Johns, MD    ASSESSMENT:    1. Permanent atrial fibrillation ([place])   2. Essential hypertension    PLAN:    In order of problems listed above:  1. Permanent atrial fibrillation:I discussed with the patient atrial fibrillation, disease process. Management and therapy including rate and rhythm control, anticoagulation benefits and potential risks were discussed extensively with the patient. Patient had multiple questions which were answered to patient's satisfaction. 2. Essential hypertension: Blood pressure is stable 3. Post pneumonitis: Managed by primary care physician and recovering well. 4. Patient will be seen in follow-up appointment in 6 months or earlier if the patient has any concerns 5. He will have complete blood work today and we will forwarded to primary care physician.   Medication Adjustments/Labs and Tests Ordered: Current medicines are reviewed at length with the patient today.  Concerns regarding medicines are outlined above.  No orders of the defined types were placed in this encounter.  No orders of the defined types were placed in this encounter.    No chief complaint on file.    History of Present Illness:    Eddie Kelly is a 85 y.o. male.  Patient has past medical history of permanent atrial fibrillation and essential hypertension.  He denies any problems at this time and takes care of activities of daily living.  He is recovering from pneumonia and is managed by his primary care physician.  He tells me that next week he is due to have chest x-rays.  At the time of my evaluation, the patient is alert awake oriented and in no distress.  Past Medical History:  Diagnosis Date  . A-fib ([place])   . Anxiety and depression   . CAD (coronary artery disease)   .  Hypothyroidism   . PVC (premature ventricular contraction)   . Sinus bradycardia   . Vitamin D deficiency     Past Surgical History:  Procedure Laterality Date  . HERNIA REPAIR      Current Medications: Current Meds  Medication Sig  . cyanocobalamin (,VITAMIN B-12,) 1000 MCG/ML injection Inject 1,000 mcg into the muscle every 30 (thirty) days.  . fluticasone (KLS ALLER-FLO) 50 MCG/ACT nasal spray Place 1 spray into both nostrils daily.  . furosemide (LASIX) 20 MG tablet Take 20 mg by mouth daily.  Marland Kitchen levothyroxine (SYNTHROID, LEVOTHROID) 88 MCG tablet Take 88 mcg by mouth daily.  Marland Kitchen loratadine (CLARITIN) 10 MG tablet Take 10 mg by mouth daily.  Marland Kitchen losartan (COZAAR) 100 MG tablet Take 100 mg by mouth daily.  . metoprolol tartrate (LOPRESSOR) 25 MG tablet Take 25 mg by mouth 2 (two) times daily.  . Misc Natural Products (PROSTATE HEALTH) CAPS Take 1 capsule by mouth daily.  . Multiple Vitamin (MULTIVITAMIN WITH MINERALS) TABS tablet Take 1 tablet by mouth daily.  . nitroGLYCERIN (NITROSTAT) 0.4 MG SL tablet Place 0.4 mg under the tongue every 5 (five) minutes as needed.  . tamsulosin (FLOMAX) 0.4 MG CAPS capsule Take 1 capsule by mouth daily.  Alveda Reasons 20 MG TABS tablet TAKE 1 TABLET(20 MG) BY MOUTH DAILY WITH SUPPER     Allergies:   Patient has no known allergies.   Social History   Socioeconomic History  . Marital status: Married    Spouse name:  Not on file  . Number of children: Not on file  . Years of education: Not on file  . Highest education level: Not on file  Occupational History  . Not on file  Social Needs  . Financial resource strain: Not on file  . Food insecurity    Worry: Not on file    Inability: Not on file  . Transportation needs    Medical: Not on file    Non-medical: Not on file  Tobacco Use  . Smoking status: Former Smoker    Quit date: 1983    Years since quitting: 37.9  . Smokeless tobacco: Never Used  Substance and Sexual Activity  . Alcohol  use: Yes    Alcohol/week: 1.0 standard drinks    Types: 1 Cans of beer per week    Comment: "every once in a blue moon"  . Drug use: No  . Sexual activity: Not on file  Lifestyle  . Physical activity    Days per week: Not on file    Minutes per session: Not on file  . Stress: Not on file  Relationships  . Social Musician on phone: Not on file    Gets together: Not on file    Attends religious service: Not on file    Active member of club or organization: Not on file    Attends meetings of clubs or organizations: Not on file    Relationship status: Not on file  Other Topics Concern  . Not on file  Social History Narrative  . Not on file     Family History: The patient's family history includes Cancer in his father; Diabetes in his brother; Heart attack in his father; Heart disease in his brother.  ROS:   Please see the history of present illness.    All other systems reviewed and are negative.  EKGs/Labs/Other Studies Reviewed:    The following studies were reviewed today: EKG reveals atrial fibrillation with well-controlled ventricular rate.   Recent Labs: No results found for requested labs within last 8760 hours.  Recent Lipid Panel No results found for: CHOL, TRIG, HDL, CHOLHDL, VLDL, LDLCALC, LDLDIRECT  Physical Exam:    VS:  BP (!) 158/76 (BP Location: Left Arm, Patient Position: Sitting, Cuff Size: Normal)   Pulse 60   Ht 6' (1.829 m)   Wt 203 lb (92.1 kg)   SpO2 98%   BMI 27.53 kg/m     Wt Readings from Last 3 Encounters:  01/14/19 203 lb (92.1 kg)  04/12/18 204 lb (92.5 kg)  09/26/17 201 lb (91.2 kg)     GEN: Patient is in no acute distress HEENT: Normal NECK: No JVD; No carotid bruits LYMPHATICS: No lymphadenopathy CARDIAC: Hear sounds irregular, 2/6 systolic murmur at the apex. RESPIRATORY:  Clear to auscultation without rales, wheezing or rhonchi  ABDOMEN: Soft, non-tender, non-distended MUSCULOSKELETAL:  No edema; No deformity   SKIN: Warm and dry NEUROLOGIC:  Alert and oriented x 3 PSYCHIATRIC:  Normal affect   Signed, Garwin Brothers, MD  01/14/2019 10:40 AM    [facility]

## 2019-01-15 LAB — COMPREHENSIVE METABOLIC PANEL
ALT: 15 IU/L (ref 0–44)
AST: 18 IU/L (ref 0–40)
Albumin/Globulin Ratio: 2 (ref 1.2–2.2)
Albumin: 4.6 g/dL (ref 3.5–4.6)
Alkaline Phosphatase: 147 IU/L — ABNORMAL HIGH (ref 39–117)
BUN/Creatinine Ratio: 25 — ABNORMAL HIGH (ref 10–24)
BUN: 32 mg/dL (ref 10–36)
Bilirubin Total: 0.8 mg/dL (ref 0.0–1.2)
CO2: 24 mmol/L (ref 20–29)
Calcium: 9.9 mg/dL (ref 8.6–10.2)
Chloride: 106 mmol/L (ref 96–106)
Creatinine, Ser: 1.29 mg/dL — ABNORMAL HIGH (ref 0.76–1.27)
GFR calc Af Amer: 56 mL/min/{1.73_m2} — ABNORMAL LOW (ref >59)
GFR calc non Af Amer: 48 mL/min/{1.73_m2} — ABNORMAL LOW (ref >59)
Globulin, Total: 2.3 g/dL (ref 1.5–4.5)
Glucose: 99 mg/dL (ref 65–99)
Potassium: 5.4 mmol/L — ABNORMAL HIGH (ref 3.5–5.2)
Sodium: 146 mmol/L — ABNORMAL HIGH (ref 134–144)
Total Protein: 6.9 g/dL (ref 6.0–8.5)

## 2019-01-15 LAB — CBC
Hematocrit: 39.3 % (ref 37.5–51.0)
Hemoglobin: 13.1 g/dL (ref 13.0–17.7)
MCH: 28.3 pg (ref 26.6–33.0)
MCHC: 33.3 g/dL (ref 31.5–35.7)
MCV: 85 fL (ref 79–97)
Platelets: 170 10*3/uL (ref 150–450)
RBC: 4.63 x10E6/uL (ref 4.14–5.80)
RDW: 14.5 % (ref 11.6–15.4)
WBC: 5.9 10*3/uL (ref 3.4–10.8)

## 2019-01-15 LAB — TSH: TSH: 0.902 u[IU]/mL (ref 0.450–4.500)

## 2019-01-15 LAB — LIPID PANEL
Chol/HDL Ratio: 3.3 ratio (ref 0.0–5.0)
Cholesterol, Total: 149 mg/dL (ref 100–199)
HDL: 45 mg/dL (ref >39)
LDL Chol Calc (NIH): 93 mg/dL (ref 0–99)
Triglycerides: 54 mg/dL (ref 0–149)
VLDL Cholesterol Cal: 11 mg/dL (ref 5–40)

## 2019-01-15 LAB — URINALYSIS
Bilirubin, UA: NEGATIVE
Glucose, UA: NEGATIVE
Ketones, UA: NEGATIVE
Leukocytes,UA: NEGATIVE
Nitrite, UA: NEGATIVE
Protein,UA: NEGATIVE
RBC, UA: NEGATIVE
Specific Gravity, UA: 1.012 (ref 1.005–1.030)
Urobilinogen, Ur: 0.2 mg/dL (ref 0.2–1.0)
pH, UA: 5.5 (ref 5.0–7.5)

## 2019-01-15 LAB — VITAMIN D 25 HYDROXY (VIT D DEFICIENCY, FRACTURES): Vit D, 25-Hydroxy: 58.4 ng/mL (ref 30.0–100.0)

## 2019-01-21 ENCOUNTER — Telehealth: Payer: Self-pay

## 2019-01-21 DIAGNOSIS — E875 Hyperkalemia: Secondary | ICD-10-CM

## 2019-01-21 DIAGNOSIS — R1310 Dysphagia, unspecified: Secondary | ICD-10-CM | POA: Diagnosis not present

## 2019-01-21 MED ORDER — SODIUM POLYSTYRENE SULFONATE PO POWD: Freq: Once | ORAL | 0 refills | Status: AC

## 2019-01-21 NOTE — Telephone Encounter (Signed)
-----   Message from Jenean Lindau, MD sent at 01/15/2019  8:17 AM EST ----- Kayexalate 15 g one-time dose only.  Recheck in 1 week.  Encourage a little more hydration.  Send copy of all his labs to primary care. Jenean Lindau, MD 01/15/2019 8:16 AM

## 2019-01-21 NOTE — Telephone Encounter (Signed)
Results relayed, patient will take kayexalate and return for labs next week.Copy sent to Dr. Rica Records

## 2019-01-29 DIAGNOSIS — E875 Hyperkalemia: Secondary | ICD-10-CM | POA: Diagnosis not present

## 2019-01-30 ENCOUNTER — Telehealth: Payer: Self-pay

## 2019-01-30 LAB — BASIC METABOLIC PANEL
BUN/Creatinine Ratio: 21 (ref 10–24)
BUN: 23 mg/dL (ref 10–36)
CO2: 23 mmol/L (ref 20–29)
Calcium: 9.6 mg/dL (ref 8.6–10.2)
Chloride: 105 mmol/L (ref 96–106)
Creatinine, Ser: 1.12 mg/dL (ref 0.76–1.27)
GFR calc Af Amer: 66 mL/min/{1.73_m2} (ref >59)
GFR calc non Af Amer: 57 mL/min/{1.73_m2} — ABNORMAL LOW (ref >59)
Glucose: 92 mg/dL (ref 65–99)
Potassium: 5.2 mmol/L (ref 3.5–5.2)
Sodium: 145 mmol/L — ABNORMAL HIGH (ref 134–144)

## 2019-01-30 NOTE — Telephone Encounter (Signed)
-----   Message from Rajan R Revankar, MD sent at 01/30/2019  8:16 AM EST ----- The results of the study is unremarkable. Please inform patient. I will discuss in detail at next appointment. Cc  primary care/referring physician Rajan R Revankar, MD 01/30/2019 8:16 AM 

## 2019-01-30 NOTE — Telephone Encounter (Signed)
Results relayed, copy sent to Dr. Uppin 

## 2019-03-04 DIAGNOSIS — I10 Essential (primary) hypertension: Secondary | ICD-10-CM | POA: Diagnosis not present

## 2019-03-04 DIAGNOSIS — R2243 Localized swelling, mass and lump, lower limb, bilateral: Secondary | ICD-10-CM | POA: Diagnosis not present

## 2019-03-04 DIAGNOSIS — R05 Cough: Secondary | ICD-10-CM | POA: Diagnosis not present

## 2019-03-04 DIAGNOSIS — R1310 Dysphagia, unspecified: Secondary | ICD-10-CM | POA: Diagnosis not present

## 2019-03-04 DIAGNOSIS — J309 Allergic rhinitis, unspecified: Secondary | ICD-10-CM | POA: Diagnosis not present

## 2019-03-19 DIAGNOSIS — R339 Retention of urine, unspecified: Secondary | ICD-10-CM | POA: Diagnosis not present

## 2019-03-25 DIAGNOSIS — R05 Cough: Secondary | ICD-10-CM | POA: Diagnosis not present

## 2019-03-25 DIAGNOSIS — R509 Fever, unspecified: Secondary | ICD-10-CM | POA: Diagnosis not present

## 2019-04-05 ENCOUNTER — Other Ambulatory Visit: Payer: Self-pay | Admitting: Cardiology

## 2019-04-05 DIAGNOSIS — I4891 Unspecified atrial fibrillation: Secondary | ICD-10-CM

## 2019-04-05 NOTE — Telephone Encounter (Signed)
91 M 92.1kg, SCr 1.12, CrCl 56.  LOV Revankar 12/2018

## 2019-04-05 NOTE — Telephone Encounter (Signed)
Refill request for Xarelto 

## 2019-05-03 DIAGNOSIS — H1032 Unspecified acute conjunctivitis, left eye: Secondary | ICD-10-CM | POA: Diagnosis not present

## 2019-05-04 DIAGNOSIS — H35051 Retinal neovascularization, unspecified, right eye: Secondary | ICD-10-CM | POA: Diagnosis not present

## 2019-05-04 DIAGNOSIS — S0502XA Injury of conjunctiva and corneal abrasion without foreign body, left eye, initial encounter: Secondary | ICD-10-CM | POA: Diagnosis not present

## 2019-05-04 DIAGNOSIS — Z961 Presence of intraocular lens: Secondary | ICD-10-CM | POA: Diagnosis not present

## 2019-05-06 DIAGNOSIS — Z0000 Encounter for general adult medical examination without abnormal findings: Secondary | ICD-10-CM | POA: Diagnosis not present

## 2019-05-06 DIAGNOSIS — Z9181 History of falling: Secondary | ICD-10-CM | POA: Diagnosis not present

## 2019-05-06 DIAGNOSIS — E785 Hyperlipidemia, unspecified: Secondary | ICD-10-CM | POA: Diagnosis not present

## 2019-05-06 DIAGNOSIS — Z136 Encounter for screening for cardiovascular disorders: Secondary | ICD-10-CM | POA: Diagnosis not present

## 2019-05-07 DIAGNOSIS — H353122 Nonexudative age-related macular degeneration, left eye, intermediate dry stage: Secondary | ICD-10-CM | POA: Diagnosis not present

## 2019-05-07 DIAGNOSIS — H353114 Nonexudative age-related macular degeneration, right eye, advanced atrophic with subfoveal involvement: Secondary | ICD-10-CM | POA: Diagnosis not present

## 2019-05-10 NOTE — Progress Notes (Addendum)
[facility] - Clinic Note  05/13/2019     CHIEF COMPLAINT Patient presents for Retina Evaluation   HISTORY OF PRESENT ILLNESS: Eddie Kelly is a 86 y.o. male who presents to the clinic today for:   HPI    Retina Evaluation    In right eye.  Onset: Unknown.  Duration: Unknown.  Context:  distance vision, mid-range vision and near vision.  Treatments tried include artificial tears.  Response to treatment was no improvement.  I, the attending physician,  performed the HPI with the patient and updated documentation appropriately.          Comments    86 y/o male pt referred by Dr. Zetta Bills on 05/04/19 for eval of CNVM OD.  Pt originally referred to Dr. Dione Booze by the ER for eval of corneal abrasion OS.  Pt feels VA is good OU (better OS).  Pt got new glasses about 1 mo ago.  Denies pain, flashes, floaters, but has mild diplopia at near.  He has prism in his glasses to correct this, and feels it has become a lot better over the yrs.  Not currently using any gtts.  Pt is on a blood thinner, and takes tylenol to help him rest at night.  He wonders if this could have contributed to his eye issue.       Last edited by [name], MD on 05/13/2019  8:53 AM. (History)    pt is here with his daughter in law, she states the pt went to the ER bc he had a scratch on his eye, she states the ER had him follow up with Dr. Dione Booze who saw some bleeding behind his eye, pt states he sees double up close, pt states he has prism in his glasses to help correct the double vision, pt states he has been told in the past that he has dry macular degeneration  Referring physician: Sallye Lat, MD [address] [place],  [place] [address]  HISTORICAL INFORMATION:   Selected notes from the MEDICAL RECORD NUMBER Referred by Dr. Zetta Bills for concern of CNVM OD LEE: 05/04/2019 BCVA: 20/70-1            20/50+1   CURRENT MEDICATIONS: No current outpatient medications on file.  (Ophthalmic Drugs)   No current facility-administered medications for this visit. (Ophthalmic Drugs)   Current Outpatient Medications (Other)  Medication Sig  . Cholecalciferol (VITAMIN D PO) Take 5,000 Units by mouth continuous dialysis.   Marland Kitchen cyanocobalamin (,VITAMIN B-12,) 1000 MCG/ML injection Inject 1,000 mcg into the muscle every 30 (thirty) days.  . diclofenac Sodium (VOLTAREN) 1 % GEL SMARTSIG:4 Gram(s) Topical 3 Times Daily PRN  . fluticasone (KLS ALLER-FLO) 50 MCG/ACT nasal spray Place 1 spray into both nostrils daily.  . furosemide (LASIX) 20 MG tablet Take 20 mg by mouth daily.  Marland Kitchen levothyroxine (SYNTHROID, LEVOTHROID) 88 MCG tablet Take 88 mcg by mouth daily.  Marland Kitchen loratadine (CLARITIN) 10 MG tablet Take 10 mg by mouth daily.  Marland Kitchen losartan (COZAAR) 100 MG tablet Take 100 mg by mouth daily.  . metoprolol tartrate (LOPRESSOR) 25 MG tablet Take 25 mg by mouth 2 (two) times daily.  . Misc Natural Products (PROSTATE HEALTH) CAPS Take 1 capsule by mouth daily.  . montelukast (SINGULAIR) 10 MG tablet Take 10 mg by mouth at bedtime.  . Multiple Vitamin (MULTIVITAMIN WITH MINERALS) TABS tablet Take 1 tablet by mouth daily.  . nitroGLYCERIN (NITROSTAT) 0.4 MG SL  tablet Place 0.4 mg under the tongue every 5 (five) minutes as needed.  . predniSONE (STERAPRED UNI-PAK 48 TAB) 10 MG (48) TBPK tablet See admin instructions. follow package directions  . SPS 15 GM/60ML suspension Take 15 g by mouth once.  . tamsulosin (FLOMAX) 0.4 MG CAPS capsule Take 1 capsule by mouth daily.  Carlena Hurl 20 MG TABS tablet TAKE 1 TABLET(20 MG) BY MOUTH DAILY WITH SUPPER  . amoxicillin-clavulanate (AUGMENTIN) 875-125 MG tablet Take 1 tablet by mouth 2 (two) times daily.  Marland Kitchen azithromycin (ZITHROMAX) 250 MG tablet Take 1 tablet by mouth daily.   No current facility-administered medications for this visit. (Other)      REVIEW OF SYSTEMS: ROS    Positive for: Cardiovascular, Eyes   Negative for: Constitutional,  Gastrointestinal, Neurological, Skin, Genitourinary, Musculoskeletal, HENT, Endocrine, Respiratory, Psychiatric, Allergic/Imm, Heme/Lymph   Last edited by Celine Mans, COA on 05/13/2019  8:43 AM. (History)       ALLERGIES No Known Allergies  PAST MEDICAL HISTORY Past Medical History:  Diagnosis Date  . A-fib ([facility])   . Anxiety and depression   . CAD (coronary artery disease)   . Hypothyroidism   . PVC (premature ventricular contraction)   . Sinus bradycardia   . Vitamin D deficiency    Past Surgical History:  Procedure Laterality Date  . CATARACT EXTRACTION Bilateral   . EYE SURGERY Bilateral    Cat Sx  . HERNIA REPAIR      FAMILY HISTORY Family History  Problem Relation Age of Onset  . Cancer Father   . Heart attack Father   . Heart disease Brother   . Diabetes Brother     SOCIAL HISTORY Social History   Tobacco Use  . Smoking status: Former Smoker    Quit date: 1983    Years since quitting: 38.2  . Smokeless tobacco: Never Used  Substance Use Topics  . Alcohol use: Yes    Alcohol/week: 1.0 standard drinks    Types: 1 Cans of beer per week    Comment: "every once in a blue moon"  . Drug use: No         OPHTHALMIC EXAM:  Base Eye Exam    Visual Acuity (Snellen - Linear)      Right Left   Dist cc 20/80 -2 20/40   Dist ph cc 20/60 -2 20/30   Correction: Glasses       Tonometry (Tonopen, 8:48 AM)      Right Left   Pressure 10 9       Pupils      Dark Light Shape React APD   Right 4 4 Round Minimal None   Left 2 2 Round Minimal None       Visual Fields (Counting fingers)      Left Right    Full Full       Extraocular Movement      Right Left    Full, Ortho Full, Ortho       Neuro/Psych    Oriented x3: Yes   Mood/Affect: Normal       Dilation    Both eyes: 1.0% Mydriacyl, 2.5% Phenylephrine @ 8:48 AM        Slit Lamp and Fundus Exam    Slit Lamp Exam      Right Left   Lids/Lashes Dermatochalasis - upper lid  Dermatochalasis - upper lid   Conjunctiva/Sclera White and quiet White and quiet   Cornea 2+ Punctate epithelial erosions, crocodile  shegreen, well healed temporal cataract wounds, dry tear film 2+ Punctate epithelial erosions, crocodile shegreen, well healed temporal cataract wounds, dry tear film   Anterior Chamber Deep and quiet Deep and quiet   Iris Round and moderately dilated Round and poorly dilated to 4.69mm   Lens PC IOL in good position with open PC PC IOL in good position with open PC   Vitreous Vitreous syneresis Vitreous syneresis       Fundus Exam      Right Left   Disc mild Pallor, Sharp rim, peripapillary CNV with +heme/exudate ST disc mild Pallor, Sharp rim   C/D Ratio 0.4 0.3   Macula Flat, Blunted foveal reflex, +peripapillary heme, SRF and exudate Flat, Blunted foveal reflex, fine Drusen, mild Retinal pigment epithelial mottling   Vessels Vascular attenuation, mild Tortuousity Vascular attenuation, mild Tortuousity   Periphery Attached, no heme, mild RPE changes   Attached, no heme, mild RPE changes          Refraction    Wearing Rx      Sphere Cylinder Axis Add   Right -3.00 +3.00 005 +2.75   Left -1.50 +3.00 167 +2.75   Age: 42m   Type: Bifocal       Manifest Refraction      Sphere Cylinder Axis Dist VA   Right -3.00 +3.00 005 20/80-   Left -1.25 +2.75 170 20/40+2          IMAGING AND PROCEDURES  Imaging and Procedures for @TODAY @  OCT, Retina - OU - Both Eyes       Right Eye Quality was borderline. Central Foveal Thickness: 328. Progression has no prior data. Findings include abnormal foveal contour, vitreous traction, epiretinal membrane, intraretinal fluid, intraretinal hyper-reflective material, subretinal fluid, subretinal hyper-reflective material, outer retinal atrophy, pigment epithelial detachment.   Left Eye Quality was borderline. Central Foveal Thickness: 297. Progression has no prior data. Findings include vitreous traction, abnormal  foveal contour, no IRF, no SRF, subretinal hyper-reflective material, retinal drusen .   Notes *Images captured and stored on drive  Diagnosis / Impression:  OD: peripapillary CNVM with +SRF/IRF; +VMT OS: non-exu ARMD; +VMT  Clinical management:  See below  Abbreviations: NFP - Normal foveal profile. CME - cystoid macular edema. PED - pigment epithelial detachment. IRF - intraretinal fluid. SRF - subretinal fluid. EZ - ellipsoid zone. ERM - epiretinal membrane. ORA - outer retinal atrophy. ORT - outer retinal tubulation. SRHM - subretinal hyper-reflective material        Intravitreal Injection, Pharmacologic Agent - OD - Right Eye       Time Out 05/13/2019. 9:33 AM. Confirmed correct patient, procedure, site, and patient consented.   Anesthesia Topical anesthesia was used. Anesthetic medications included Lidocaine 2%, Proparacaine 0.5%.   Procedure Preparation included 5% betadine to ocular surface, eyelid speculum. A supplied needle was used.   Injection:  1.25 mg Bevacizumab (AVASTIN) SOLN   NDC: 05/15/2019, Lot: [id number] , Expiration date: 06/12/2019   Route: Intravitreal, Site: Right Eye, Waste: 0 mL  Post-op Post injection exam found visual acuity of at least counting fingers. The patient tolerated the procedure well. There were no complications. The patient received written and verbal post procedure care education.                 ASSESSMENT/PLAN:    ICD-10-CM   1. Exudative age-related macular degeneration of right eye with active choroidal neovascularization (HCC)  H35.3211 Intravitreal Injection, Pharmacologic Agent - OD - Right Eye    Bevacizumab (  AVASTIN) SOLN 1.25 mg  2. Retinal edema  H35.81 OCT, Retina - OU - Both Eyes  3. Intermediate stage nonexudative age-related macular degeneration of left eye  H35.3122   4. Vitreomacular adhesion of both eyes  H43.823   5. Essential hypertension  I10   6. Hypertensive retinopathy of both eyes  H35.033   7.  Pseudophakia of both eyes  Z96.1   8. Diplopia  H53.2     1,2. Exudative age related macular degeneration, right eye   - incidental discovery of peripapillary CNV w/ heme and SRF OD during emergent visit for corneal abrasion OS  - The incidence pathology and anatomy of wet AMD discussed   - The ANCHOR, MARINA, CATT and VIEW trials discussed with patient.    - discussed treatment options including observation vs intravitreal anti-VEGF agents such as Avastin, Lucentis, Eylea.    - Risks of endophthalmitis and vascular occlusive events and atrophic changes discussed with patient  - OCT shows peripapillary CNVM w/ +IRF/SRF  - BCVA 20/60  - recommend IVA OD #1 today  - pt wishes to be treated with IVA  - RBA of procedure discussed, questions answered  - informed consent obtained, signed and scanned, 03.22.21  - see procedure note  - f/u in 4 wks -- DFE/OCT, possible injection  3. Age related macular degeneration, non-exudative, left eye  - The incidence, anatomy, and pathology of dry AMD, risk of progression, and the AREDS and AREDS 2 study including smoking risks discussed with patient.  - Recommend amsler grid monitoring  4. VMT OU  - mild  - monitor  5,6. Hypertensive retinopathy OU  - discussed importance of tight BP control  - monitor  7. Pseudophakia OU  - s/p CE/IOL OU  - IOLs in good position, doing well  - monitor  8. Diplopia  - pt with long-standing history of diplopia  - has prism in glasses  - pt reports recent improvement in diplopia over the last several weeks -- ?affected by peripapillary CNV   Ophthalmic Meds Ordered this visit:  Meds ordered this encounter  Medications  . Bevacizumab (AVASTIN) SOLN 1.25 mg       Return in about 4 weeks (around 06/10/2019) for f/u [name], DFE, OCT.  There are no Patient Instructions on file for this visit.   Explained the diagnoses, plan, and follow up with the patient and they expressed understanding.  Patient  expressed understanding of the importance of proper follow up care.   This document serves as a record of services personally performed by Karie ChimeraBrian G. Hubert Raatz, MD, PhD. It was created on their behalf by Herby AbrahamAshley English, COA, a certified ophthalmic assistant. The creation of this record is the provider's dictation and/or activities during the visit.    Electronically signed by: Herby AbrahamAshley English, COA @TODAY @ 11:40 AM   This document serves as a record of services personally performed by Karie ChimeraBrian G. Fynn Vanblarcom, MD, PhD. It was created on their behalf by Laurian BrimAmanda Brown, OA, an ophthalmic assistant. The creation of this record is the provider's dictation and/or activities during the visit.    Electronically signed by: Laurian BrimAmanda Brown, OA 03.22.2021 11:40 AM   Karie ChimeraBrian G. Tyris Eliot, M.D., Ph.D. Diseases & Surgery of the Retina and Vitreous [facility]  I have reviewed the above documentation for accuracy and completeness, and I agree with the above. Karie ChimeraBrian G. Yohannes Waibel, M.D., Ph.D. 05/13/19 11:40 AM   Abbreviations: M myopia (nearsighted); A astigmatism; H hyperopia (farsighted); P  presbyopia; Mrx spectacle prescription;  CTL contact lenses; OD right eye; OS left eye; OU both eyes  XT exotropia; ET esotropia; PEK punctate epithelial keratitis; PEE punctate epithelial erosions; DES dry eye syndrome; MGD meibomian gland dysfunction; ATs artificial tears; PFAT's preservative free artificial tears; [place] nuclear sclerotic cataract; PSC posterior subcapsular cataract; ERM epi-retinal membrane; PVD posterior vitreous detachment; RD retinal detachment; DM diabetes mellitus; DR diabetic retinopathy; NPDR non-proliferative diabetic retinopathy; PDR proliferative diabetic retinopathy; CSME clinically significant macular edema; DME diabetic macular edema; dbh dot blot hemorrhages; CWS cotton wool spot; POAG primary open angle glaucoma; C/D cup-to-disc ratio; HVF humphrey visual field; GVF goldmann visual field; OCT  optical coherence tomography; IOP intraocular pressure; BRVO Branch retinal vein occlusion; CRVO central retinal vein occlusion; CRAO central retinal artery occlusion; BRAO branch retinal artery occlusion; RT retinal tear; SB scleral buckle; PPV pars plana vitrectomy; VH Vitreous hemorrhage; PRP panretinal laser photocoagulation; IVK intravitreal kenalog; VMT vitreomacular traction; MH Macular hole;  NVD neovascularization of the disc; NVE neovascularization elsewhere; AREDS age related eye disease study; ARMD age related macular degeneration; POAG primary open angle glaucoma; EBMD epithelial/anterior basement membrane dystrophy; ACIOL anterior chamber intraocular lens; IOL intraocular lens; PCIOL posterior chamber intraocular lens; Phaco/IOL phacoemulsification with intraocular lens placement; [place] photorefractive keratectomy; LASIK laser assisted in situ keratomileusis; HTN hypertension; DM diabetes mellitus; COPD chronic obstructive pulmonary disease

## 2019-05-13 ENCOUNTER — Encounter (INDEPENDENT_AMBULATORY_CARE_PROVIDER_SITE_OTHER): Payer: Self-pay | Admitting: Ophthalmology

## 2019-05-13 ENCOUNTER — Ambulatory Visit (INDEPENDENT_AMBULATORY_CARE_PROVIDER_SITE_OTHER): Payer: Medicare Other | Admitting: Ophthalmology

## 2019-05-13 DIAGNOSIS — H532 Diplopia: Secondary | ICD-10-CM

## 2019-05-13 DIAGNOSIS — Z961 Presence of intraocular lens: Secondary | ICD-10-CM

## 2019-05-13 DIAGNOSIS — H43823 Vitreomacular adhesion, bilateral: Secondary | ICD-10-CM

## 2019-05-13 DIAGNOSIS — H353211 Exudative age-related macular degeneration, right eye, with active choroidal neovascularization: Secondary | ICD-10-CM | POA: Diagnosis not present

## 2019-05-13 DIAGNOSIS — H3581 Retinal edema: Secondary | ICD-10-CM | POA: Diagnosis not present

## 2019-05-13 DIAGNOSIS — H353122 Nonexudative age-related macular degeneration, left eye, intermediate dry stage: Secondary | ICD-10-CM

## 2019-05-13 DIAGNOSIS — I10 Essential (primary) hypertension: Secondary | ICD-10-CM

## 2019-05-13 DIAGNOSIS — H35033 Hypertensive retinopathy, bilateral: Secondary | ICD-10-CM

## 2019-05-13 MED ORDER — BEVACIZUMAB CHEMO INJECTION 1.25MG/0.05ML SYRINGE FOR KALEIDOSCOPE
1.2500 mg | INTRAVITREAL | Status: AC | PRN
  Administered 2019-05-13: 1.25 mg via INTRAVITREAL

## 2019-06-05 DIAGNOSIS — Z8679 Personal history of other diseases of the circulatory system: Secondary | ICD-10-CM | POA: Diagnosis not present

## 2019-06-05 DIAGNOSIS — I10 Essential (primary) hypertension: Secondary | ICD-10-CM | POA: Diagnosis not present

## 2019-06-05 DIAGNOSIS — M1710 Unilateral primary osteoarthritis, unspecified knee: Secondary | ICD-10-CM | POA: Diagnosis not present

## 2019-06-05 DIAGNOSIS — G629 Polyneuropathy, unspecified: Secondary | ICD-10-CM | POA: Diagnosis not present

## 2019-06-05 DIAGNOSIS — J309 Allergic rhinitis, unspecified: Secondary | ICD-10-CM | POA: Diagnosis not present

## 2019-06-05 NOTE — Progress Notes (Signed)
[facility] - Clinic Note  06/10/2019     CHIEF COMPLAINT Patient presents for Retina Follow Up   HISTORY OF PRESENT ILLNESS: Eddie Kelly is a 86 y.o. male who presents to the clinic today for:   HPI    Retina Follow Up    Patient presents with  Wet AMD.  In right eye.  This started 4 weeks ago.  Severity is moderate.  I, the attending physician,  performed the HPI with the patient and updated documentation appropriately.          Comments    Patient here for 4 weeks retina follow up for [name]. Patient states vision about the same. No difference. No eye pain.        Last edited by [name], MD on 06/10/2019  9:53 AM. (History)    pt states he did not notice any change in vision after the first injection, he states his double vision is the best it has been in 10 years   Referring physician: [name], MD [address] [place],  [place] [address]  HISTORICAL INFORMATION:   Selected notes from the MEDICAL RECORD NUMBER Referred by Dr. Zetta Bills for concern of CNVM OD LEE: 05/04/2019 BCVA: 20/70-1            20/50+1   CURRENT MEDICATIONS: No current outpatient medications on file. (Ophthalmic Drugs)   No current facility-administered medications for this visit. (Ophthalmic Drugs)   Current Outpatient Medications (Other)  Medication Sig  . amoxicillin-clavulanate (AUGMENTIN) 875-125 MG tablet Take 1 tablet by mouth 2 (two) times daily.  Marland Kitchen azithromycin (ZITHROMAX) 250 MG tablet Take 1 tablet by mouth daily.  . Cholecalciferol (VITAMIN D PO) Take 5,000 Units by mouth continuous dialysis.   Marland Kitchen cyanocobalamin (,VITAMIN B-12,) 1000 MCG/ML injection Inject 1,000 mcg into the muscle every 30 (thirty) days.  . diclofenac Sodium (VOLTAREN) 1 % GEL SMARTSIG:4 Gram(s) Topical 3 Times Daily PRN  . fluticasone (KLS ALLER-FLO) 50 MCG/ACT nasal spray Place 1 spray into both nostrils daily.  . furosemide (LASIX) 20 MG tablet Take 20 mg by  mouth daily.  Marland Kitchen levothyroxine (SYNTHROID, LEVOTHROID) 88 MCG tablet Take 88 mcg by mouth daily.  Marland Kitchen loratadine (CLARITIN) 10 MG tablet Take 10 mg by mouth daily.  Marland Kitchen losartan (COZAAR) 100 MG tablet Take 100 mg by mouth daily.  . metoprolol tartrate (LOPRESSOR) 25 MG tablet Take 25 mg by mouth 2 (two) times daily.  . Misc Natural Products (PROSTATE HEALTH) CAPS Take 1 capsule by mouth daily.  . montelukast (SINGULAIR) 10 MG tablet Take 10 mg by mouth at bedtime.  . Multiple Vitamin (MULTIVITAMIN WITH MINERALS) TABS tablet Take 1 tablet by mouth daily.  . nitroGLYCERIN (NITROSTAT) 0.4 MG SL tablet Place 0.4 mg under the tongue every 5 (five) minutes as needed.  . predniSONE (STERAPRED UNI-PAK 48 TAB) 10 MG (48) TBPK tablet See admin instructions. follow package directions  . SPS 15 GM/60ML suspension Take 15 g by mouth once.  . tamsulosin (FLOMAX) 0.4 MG CAPS capsule Take 1 capsule by mouth daily.  Carlena Hurl 20 MG TABS tablet TAKE 1 TABLET(20 MG) BY MOUTH DAILY WITH SUPPER   No current facility-administered medications for this visit. (Other)      REVIEW OF SYSTEMS: ROS    Positive for: Cardiovascular, Eyes   Negative for: Constitutional, Gastrointestinal, Neurological, Skin, Genitourinary, Musculoskeletal, HENT, Endocrine, Respiratory, Psychiatric, Allergic/Imm, Heme/Lymph   Last edited by Betsey Holiday  S, COA on 06/10/2019  9:07 AM. (History)       ALLERGIES No Known Allergies  PAST MEDICAL HISTORY Past Medical History:  Diagnosis Date  . A-fib (HCC)   . Anxiety and depression   . CAD (coronary artery disease)   . Hypothyroidism   . PVC (premature ventricular contraction)   . Sinus bradycardia   . Vitamin D deficiency    Past Surgical History:  Procedure Laterality Date  . CATARACT EXTRACTION Bilateral   . EYE SURGERY Bilateral    Cat Sx  . HERNIA REPAIR      FAMILY HISTORY Family History  Problem Relation Age of Onset  . Cancer Father   . Heart attack Father   .  Heart disease Brother   . Diabetes Brother     SOCIAL HISTORY Social History   Tobacco Use  . Smoking status: Former Smoker    Quit date: 1983    Years since quitting: 38.3  . Smokeless tobacco: Never Used  Substance Use Topics  . Alcohol use: Yes    Alcohol/week: 1.0 standard drinks    Types: 1 Cans of beer per week    Comment: "every once in a blue moon"  . Drug use: No         OPHTHALMIC EXAM:  Base Eye Exam    Visual Acuity (Snellen - Linear)      Right Left   Dist cc 20/80 -2 20/40 -2   Dist ph cc 20/70 NI   Correction: Glasses       Tonometry (Tonopen, 9:04 AM)      Right Left   Pressure 10 10       Pupils      Dark Light Shape React APD   Right 4 4 Round Minimal None   Left 2 2 Round Minimal None       Visual Fields (Counting fingers)      Left Right    Full Full       Extraocular Movement      Right Left    Full, Ortho Full, Ortho       Neuro/Psych    Oriented x3: Yes   Mood/Affect: Normal       Dilation    Both eyes: 1.0% Mydriacyl, 2.5% Phenylephrine @ 9:04 AM        Slit Lamp and Fundus Exam    Slit Lamp Exam      Right Left   Lids/Lashes Dermatochalasis - upper lid Dermatochalasis - upper lid   Conjunctiva/Sclera White and quiet White and quiet   Cornea 2+ Punctate epithelial erosions, crocodile shegreen, well healed temporal cataract wounds, dry tear film 2+ Punctate epithelial erosions, crocodile shegreen, well healed temporal cataract wounds, dry tear film   Anterior Chamber Deep and quiet Deep and quiet   Iris Round and moderately dilated Round and poorly dilated to 4.775mm   Lens PC IOL in good position with open PC PC IOL in good position with open PC   Vitreous Vitreous syneresis Vitreous syneresis       Fundus Exam      Right Left   Disc mild Pallor, Sharp rim, peripapillary CNV with +heme/exudate ST disc -- improving mild Pallor, Sharp rim   C/D Ratio 0.4 0.5   Macula Flat, Blunted foveal reflex, +peripapillary heme,  SRF and exudate -- improving Flat, Blunted foveal reflex, fine Drusen, mild Retinal pigment epithelial mottling   Vessels Vascular attenuation, mild Tortuousity Vascular attenuation, mild Tortuousity   Periphery  Attached, no heme, mild RPE changes, focal exudate ST disc Attached, no heme, mild RPE changes          Refraction    Wearing Rx      Sphere Cylinder Axis Add   Right -3.00 +3.00 005 +2.75   Left -1.50 +3.00 167 +2.75   Type: Bifocal          IMAGING AND PROCEDURES  Imaging and Procedures for @TODAY @  OCT, Retina - OU - Both Eyes       Right Eye Quality was borderline. Central Foveal Thickness: 297. Progression has improved. Findings include abnormal foveal contour, vitreous traction, epiretinal membrane, intraretinal fluid, intraretinal hyper-reflective material, subretinal fluid, subretinal hyper-reflective material, outer retinal atrophy, pigment epithelial detachment (Mild interval improvement in SRF and IRHM; persistent VMT with central cystic changes).   Left Eye Quality was borderline. Central Foveal Thickness: 297. Progression has been stable. Findings include vitreous traction, abnormal foveal contour, no IRF, no SRF, subretinal hyper-reflective material, retinal drusen  (Stable VMT with central SRHM).   Notes *Images captured and stored on drive  Diagnosis / Impression:  OD: peripapillary CNVM with +SRF/IRF; VMT -- Mild interval improvement in SRF and IRHM; persistent VMT with central cystic changes  OS: non-exu ARMD; +VMT -- Stable VMT with central SRHM  Clinical management:  See below  Abbreviations: NFP - Normal foveal profile. CME - cystoid macular edema. PED - pigment epithelial detachment. IRF - intraretinal fluid. SRF - subretinal fluid. EZ - ellipsoid zone. ERM - epiretinal membrane. ORA - outer retinal atrophy. ORT - outer retinal tubulation. SRHM - subretinal hyper-reflective material        Intravitreal Injection, Pharmacologic Agent - OD -  Right Eye       Time Out 06/10/2019. 8:46 AM. Confirmed correct patient, procedure, site, and patient consented.   Anesthesia Topical anesthesia was used. Anesthetic medications included Lidocaine 2%, Proparacaine 0.5%.   Procedure Preparation included 5% betadine to ocular surface, eyelid speculum. A supplied needle was used.   Injection:  1.25 mg Bevacizumab (AVASTIN) SOLN   NDC: 06/12/2019, Lot: [id number] , Expiration date: 07/25/2019   Route: Intravitreal, Site: Right Eye, Waste: 0 mL  Post-op Post injection exam found visual acuity of at least counting fingers. The patient tolerated the procedure well. There were no complications. The patient received written and verbal post procedure care education.                 ASSESSMENT/PLAN:    ICD-10-CM   1. Exudative age-related macular degeneration of right eye with active choroidal neovascularization (HCC)  H35.3211 Intravitreal Injection, Pharmacologic Agent - OD - Right Eye    Bevacizumab (AVASTIN) SOLN 1.25 mg  2. Retinal edema  H35.81 OCT, Retina - OU - Both Eyes  3. Hypertensive retinopathy of both eyes  H35.033   4. Intermediate stage nonexudative age-related macular degeneration of left eye  H35.3122   5. Essential hypertension  I10   6. Diplopia  H53.2   7. Pseudophakia of both eyes  Z96.1   8. Vitreomacular adhesion of both eyes  H43.823     1,2. Exudative age related macular degeneration, right eye   - incidental discovery of peripapillary CNV w/ heme and SRF OD during emergent visit for corneal abrasion OS             - S/P IVA OD #1 (03.22.21)  - OCT shows mild interval improvement in SRF and IRHM; persistent VMT with central cystic changes   - BCVA decreased to  20/70 from 20/60  - recommend IVA OD #2 today, 04.19.21  - pt wishes to be treated with IVA  - RBA of procedure discussed, questions answered  - informed consent obtained, signed and scanned, 03.22.21  - see procedure note  - f/u in 5 wks --  DFE/OCT, possible injection  3. Age related macular degeneration, non-exudative, left eye  - The incidence, anatomy, and pathology of dry AMD, risk of progression, and the AREDS and AREDS 2 study including smoking risks discussed with patient.  - Recommend amsler grid monitoring  4. VMT OU  - mild  - monitor  5,6. Hypertensive retinopathy OU  - discussed importance of tight BP control  - monitor  7. Pseudophakia OU  - s/p CE/IOL OU  - IOLs in good position, doing well  - monitor  8. Diplopia  - pt with long-standing history of diplopia  - has prism in glasses  - pt reports recent improvement in diplopia over the last several weeks -- ?affected by peripapillary CNV   Ophthalmic Meds Ordered this visit:  Meds ordered this encounter  Medications  . Bevacizumab (AVASTIN) SOLN 1.25 mg       Return in about 5 weeks (around 07/15/2019) for f/u [name], DFE, OCT.  There are no Patient Instructions on file for this visit.   Explained the diagnoses, plan, and follow up with the patient and they expressed understanding.  Patient expressed understanding of the importance of proper follow up care.   This document serves as a record of services personally performed by Karie Chimera, MD, PhD. It was created on their behalf by Herby Abraham, COA, a certified ophthalmic assistant. The creation of this record is the provider's dictation and/or activities during the visit.    Electronically signed by: Herby Abraham, COA @TODAY @ 9:15 PM   This document serves as a record of services personally performed by , MD, PhD. It was created on their behalf by Karie Chimera, OA, an ophthalmic assistant. The creation of this record is the provider's dictation and/or activities during the visit.    Electronically signed by: Laurian Brim, OA 04.19.2021 9:15 PM   04.21.2021, M.D., Ph.D. Diseases & Surgery of the Retina and Vitreous [facility]  I  have reviewed the above documentation for accuracy and completeness, and I agree with the above. [facility], M.D., Ph.D. 06/10/19 9:15 PM    Abbreviations: M myopia (nearsighted); A astigmatism; H hyperopia (farsighted); P presbyopia; Mrx spectacle prescription;  CTL contact lenses; OD right eye; OS left eye; OU both eyes  XT exotropia; ET esotropia; PEK punctate epithelial keratitis; PEE punctate epithelial erosions; DES dry eye syndrome; MGD meibomian gland dysfunction; ATs artificial tears; PFAT's preservative free artificial tears; NSC nuclear sclerotic cataract; PSC posterior subcapsular cataract; ERM epi-retinal membrane; PVD posterior vitreous detachment; RD retinal detachment; DM diabetes mellitus; DR diabetic retinopathy; NPDR non-proliferative diabetic retinopathy; PDR proliferative diabetic retinopathy; CSME clinically significant macular edema; DME diabetic macular edema; dbh dot blot hemorrhages; CWS cotton wool spot; POAG primary open angle glaucoma; C/D cup-to-disc ratio; HVF humphrey visual field; GVF goldmann visual field; OCT optical coherence tomography; IOP intraocular pressure; BRVO Branch retinal vein occlusion; CRVO central retinal vein occlusion; CRAO central retinal artery occlusion; BRAO branch retinal artery occlusion; RT retinal tear; SB scleral buckle; PPV pars plana vitrectomy; VH Vitreous hemorrhage; PRP panretinal laser photocoagulation; IVK intravitreal kenalog; VMT vitreomacular traction; MH Macular hole;  NVD neovascularization of the disc;  NVE neovascularization elsewhere; AREDS age related eye disease study; ARMD age related macular degeneration; POAG primary open angle glaucoma; EBMD epithelial/anterior basement membrane dystrophy; ACIOL anterior chamber intraocular lens; IOL intraocular lens; PCIOL posterior chamber intraocular lens; Phaco/IOL phacoemulsification with intraocular lens placement; [place] photorefractive keratectomy; LASIK laser assisted in situ  keratomileusis; HTN hypertension; DM diabetes mellitus; COPD chronic obstructive pulmonary disease

## 2019-06-10 ENCOUNTER — Ambulatory Visit (INDEPENDENT_AMBULATORY_CARE_PROVIDER_SITE_OTHER): Payer: Medicare Other | Admitting: Ophthalmology

## 2019-06-10 ENCOUNTER — Encounter (INDEPENDENT_AMBULATORY_CARE_PROVIDER_SITE_OTHER): Payer: Self-pay | Admitting: Ophthalmology

## 2019-06-10 ENCOUNTER — Other Ambulatory Visit: Payer: Self-pay

## 2019-06-10 DIAGNOSIS — H353211 Exudative age-related macular degeneration, right eye, with active choroidal neovascularization: Secondary | ICD-10-CM

## 2019-06-10 DIAGNOSIS — H3581 Retinal edema: Secondary | ICD-10-CM

## 2019-06-10 DIAGNOSIS — H353122 Nonexudative age-related macular degeneration, left eye, intermediate dry stage: Secondary | ICD-10-CM | POA: Diagnosis not present

## 2019-06-10 DIAGNOSIS — H43823 Vitreomacular adhesion, bilateral: Secondary | ICD-10-CM

## 2019-06-10 DIAGNOSIS — I10 Essential (primary) hypertension: Secondary | ICD-10-CM | POA: Diagnosis not present

## 2019-06-10 DIAGNOSIS — Z961 Presence of intraocular lens: Secondary | ICD-10-CM

## 2019-06-10 DIAGNOSIS — H35033 Hypertensive retinopathy, bilateral: Secondary | ICD-10-CM | POA: Diagnosis not present

## 2019-06-10 DIAGNOSIS — H532 Diplopia: Secondary | ICD-10-CM

## 2019-06-10 MED ORDER — BEVACIZUMAB CHEMO INJECTION 1.25MG/0.05ML SYRINGE FOR KALEIDOSCOPE
1.2500 mg | INTRAVITREAL | Status: AC | PRN
  Administered 2019-06-10: 21:00:00 1.25 mg via INTRAVITREAL

## 2019-06-11 DIAGNOSIS — I10 Essential (primary) hypertension: Secondary | ICD-10-CM | POA: Diagnosis not present

## 2019-06-11 DIAGNOSIS — Z743 Need for continuous supervision: Secondary | ICD-10-CM | POA: Diagnosis not present

## 2019-06-11 DIAGNOSIS — J9601 Acute respiratory failure with hypoxia: Secondary | ICD-10-CM | POA: Diagnosis not present

## 2019-06-11 DIAGNOSIS — Z79899 Other long term (current) drug therapy: Secondary | ICD-10-CM | POA: Diagnosis not present

## 2019-06-11 DIAGNOSIS — J690 Pneumonitis due to inhalation of food and vomit: Secondary | ICD-10-CM | POA: Diagnosis not present

## 2019-06-11 DIAGNOSIS — I4891 Unspecified atrial fibrillation: Secondary | ICD-10-CM | POA: Diagnosis not present

## 2019-06-11 DIAGNOSIS — Z7982 Long term (current) use of aspirin: Secondary | ICD-10-CM | POA: Diagnosis not present

## 2019-06-11 DIAGNOSIS — E039 Hypothyroidism, unspecified: Secondary | ICD-10-CM | POA: Diagnosis not present

## 2019-06-11 DIAGNOSIS — J449 Chronic obstructive pulmonary disease, unspecified: Secondary | ICD-10-CM | POA: Diagnosis not present

## 2019-06-11 DIAGNOSIS — Z881 Allergy status to other antibiotic agents status: Secondary | ICD-10-CM | POA: Diagnosis not present

## 2019-06-11 DIAGNOSIS — R0689 Other abnormalities of breathing: Secondary | ICD-10-CM | POA: Diagnosis not present

## 2019-06-11 DIAGNOSIS — K920 Hematemesis: Secondary | ICD-10-CM | POA: Diagnosis not present

## 2019-06-11 DIAGNOSIS — T45515A Adverse effect of anticoagulants, initial encounter: Secondary | ICD-10-CM | POA: Diagnosis not present

## 2019-06-11 DIAGNOSIS — R040 Epistaxis: Secondary | ICD-10-CM | POA: Diagnosis not present

## 2019-06-11 DIAGNOSIS — R58 Hemorrhage, not elsewhere classified: Secondary | ICD-10-CM | POA: Diagnosis not present

## 2019-06-11 DIAGNOSIS — R0602 Shortness of breath: Secondary | ICD-10-CM | POA: Diagnosis not present

## 2019-06-11 DIAGNOSIS — Z7902 Long term (current) use of antithrombotics/antiplatelets: Secondary | ICD-10-CM | POA: Diagnosis not present

## 2019-06-11 DIAGNOSIS — M1990 Unspecified osteoarthritis, unspecified site: Secondary | ICD-10-CM | POA: Diagnosis not present

## 2019-06-11 DIAGNOSIS — R000 Tachycardia, unspecified: Secondary | ICD-10-CM | POA: Diagnosis not present

## 2019-06-11 DIAGNOSIS — Z87891 Personal history of nicotine dependence: Secondary | ICD-10-CM | POA: Diagnosis not present

## 2019-06-12 DIAGNOSIS — I4891 Unspecified atrial fibrillation: Secondary | ICD-10-CM | POA: Diagnosis not present

## 2019-06-12 DIAGNOSIS — J690 Pneumonitis due to inhalation of food and vomit: Secondary | ICD-10-CM | POA: Diagnosis not present

## 2019-06-12 DIAGNOSIS — R040 Epistaxis: Secondary | ICD-10-CM | POA: Diagnosis not present

## 2019-06-12 DIAGNOSIS — J9601 Acute respiratory failure with hypoxia: Secondary | ICD-10-CM | POA: Diagnosis not present

## 2019-06-13 DIAGNOSIS — J690 Pneumonitis due to inhalation of food and vomit: Secondary | ICD-10-CM | POA: Diagnosis not present

## 2019-06-13 DIAGNOSIS — I4891 Unspecified atrial fibrillation: Secondary | ICD-10-CM | POA: Diagnosis not present

## 2019-06-13 DIAGNOSIS — J9601 Acute respiratory failure with hypoxia: Secondary | ICD-10-CM | POA: Diagnosis not present

## 2019-06-13 DIAGNOSIS — R040 Epistaxis: Secondary | ICD-10-CM | POA: Diagnosis not present

## 2019-06-14 DIAGNOSIS — R040 Epistaxis: Secondary | ICD-10-CM | POA: Diagnosis not present

## 2019-06-14 DIAGNOSIS — J690 Pneumonitis due to inhalation of food and vomit: Secondary | ICD-10-CM | POA: Diagnosis not present

## 2019-06-14 DIAGNOSIS — J9601 Acute respiratory failure with hypoxia: Secondary | ICD-10-CM | POA: Diagnosis not present

## 2019-06-14 DIAGNOSIS — I4891 Unspecified atrial fibrillation: Secondary | ICD-10-CM | POA: Diagnosis not present

## 2019-06-16 DIAGNOSIS — Z87891 Personal history of nicotine dependence: Secondary | ICD-10-CM | POA: Diagnosis not present

## 2019-06-16 DIAGNOSIS — M1990 Unspecified osteoarthritis, unspecified site: Secondary | ICD-10-CM | POA: Diagnosis not present

## 2019-06-16 DIAGNOSIS — E039 Hypothyroidism, unspecified: Secondary | ICD-10-CM | POA: Diagnosis not present

## 2019-06-16 DIAGNOSIS — R040 Epistaxis: Secondary | ICD-10-CM | POA: Diagnosis not present

## 2019-06-16 DIAGNOSIS — J449 Chronic obstructive pulmonary disease, unspecified: Secondary | ICD-10-CM | POA: Diagnosis not present

## 2019-06-16 DIAGNOSIS — I4519 Other right bundle-branch block: Secondary | ICD-10-CM | POA: Diagnosis not present

## 2019-06-16 DIAGNOSIS — Z7982 Long term (current) use of aspirin: Secondary | ICD-10-CM | POA: Diagnosis not present

## 2019-06-16 DIAGNOSIS — I4891 Unspecified atrial fibrillation: Secondary | ICD-10-CM | POA: Diagnosis not present

## 2019-06-16 DIAGNOSIS — J690 Pneumonitis due to inhalation of food and vomit: Secondary | ICD-10-CM | POA: Diagnosis not present

## 2019-06-16 DIAGNOSIS — T45515D Adverse effect of anticoagulants, subsequent encounter: Secondary | ICD-10-CM | POA: Diagnosis not present

## 2019-06-16 DIAGNOSIS — J9601 Acute respiratory failure with hypoxia: Secondary | ICD-10-CM | POA: Diagnosis not present

## 2019-06-16 DIAGNOSIS — Z7902 Long term (current) use of antithrombotics/antiplatelets: Secondary | ICD-10-CM | POA: Diagnosis not present

## 2019-06-16 DIAGNOSIS — Z9981 Dependence on supplemental oxygen: Secondary | ICD-10-CM | POA: Diagnosis not present

## 2019-06-16 DIAGNOSIS — I10 Essential (primary) hypertension: Secondary | ICD-10-CM | POA: Diagnosis not present

## 2019-06-18 DIAGNOSIS — I10 Essential (primary) hypertension: Secondary | ICD-10-CM | POA: Diagnosis not present

## 2019-06-18 DIAGNOSIS — J690 Pneumonitis due to inhalation of food and vomit: Secondary | ICD-10-CM | POA: Diagnosis not present

## 2019-06-18 DIAGNOSIS — E039 Hypothyroidism, unspecified: Secondary | ICD-10-CM | POA: Diagnosis not present

## 2019-06-18 DIAGNOSIS — Z9981 Dependence on supplemental oxygen: Secondary | ICD-10-CM | POA: Diagnosis not present

## 2019-06-18 DIAGNOSIS — T45515D Adverse effect of anticoagulants, subsequent encounter: Secondary | ICD-10-CM | POA: Diagnosis not present

## 2019-06-18 DIAGNOSIS — Z87891 Personal history of nicotine dependence: Secondary | ICD-10-CM | POA: Diagnosis not present

## 2019-06-18 DIAGNOSIS — J9601 Acute respiratory failure with hypoxia: Secondary | ICD-10-CM | POA: Diagnosis not present

## 2019-06-18 DIAGNOSIS — I4519 Other right bundle-branch block: Secondary | ICD-10-CM | POA: Diagnosis not present

## 2019-06-18 DIAGNOSIS — Z7902 Long term (current) use of antithrombotics/antiplatelets: Secondary | ICD-10-CM | POA: Diagnosis not present

## 2019-06-18 DIAGNOSIS — R040 Epistaxis: Secondary | ICD-10-CM | POA: Diagnosis not present

## 2019-06-18 DIAGNOSIS — I4891 Unspecified atrial fibrillation: Secondary | ICD-10-CM | POA: Diagnosis not present

## 2019-06-18 DIAGNOSIS — Z7982 Long term (current) use of aspirin: Secondary | ICD-10-CM | POA: Diagnosis not present

## 2019-06-18 DIAGNOSIS — J449 Chronic obstructive pulmonary disease, unspecified: Secondary | ICD-10-CM | POA: Diagnosis not present

## 2019-06-18 DIAGNOSIS — M1990 Unspecified osteoarthritis, unspecified site: Secondary | ICD-10-CM | POA: Diagnosis not present

## 2019-06-20 DIAGNOSIS — J449 Chronic obstructive pulmonary disease, unspecified: Secondary | ICD-10-CM | POA: Diagnosis not present

## 2019-06-20 DIAGNOSIS — R040 Epistaxis: Secondary | ICD-10-CM | POA: Diagnosis not present

## 2019-06-20 DIAGNOSIS — E039 Hypothyroidism, unspecified: Secondary | ICD-10-CM | POA: Diagnosis not present

## 2019-06-20 DIAGNOSIS — M1990 Unspecified osteoarthritis, unspecified site: Secondary | ICD-10-CM | POA: Diagnosis not present

## 2019-06-20 DIAGNOSIS — J690 Pneumonitis due to inhalation of food and vomit: Secondary | ICD-10-CM | POA: Diagnosis not present

## 2019-06-20 DIAGNOSIS — I4519 Other right bundle-branch block: Secondary | ICD-10-CM | POA: Diagnosis not present

## 2019-06-20 DIAGNOSIS — J9601 Acute respiratory failure with hypoxia: Secondary | ICD-10-CM | POA: Diagnosis not present

## 2019-06-20 DIAGNOSIS — Z87891 Personal history of nicotine dependence: Secondary | ICD-10-CM | POA: Diagnosis not present

## 2019-06-20 DIAGNOSIS — Z7982 Long term (current) use of aspirin: Secondary | ICD-10-CM | POA: Diagnosis not present

## 2019-06-20 DIAGNOSIS — Z9981 Dependence on supplemental oxygen: Secondary | ICD-10-CM | POA: Diagnosis not present

## 2019-06-20 DIAGNOSIS — T45515D Adverse effect of anticoagulants, subsequent encounter: Secondary | ICD-10-CM | POA: Diagnosis not present

## 2019-06-20 DIAGNOSIS — I4891 Unspecified atrial fibrillation: Secondary | ICD-10-CM | POA: Diagnosis not present

## 2019-06-20 DIAGNOSIS — I10 Essential (primary) hypertension: Secondary | ICD-10-CM | POA: Diagnosis not present

## 2019-06-20 DIAGNOSIS — Z7902 Long term (current) use of antithrombotics/antiplatelets: Secondary | ICD-10-CM | POA: Diagnosis not present

## 2019-06-24 DIAGNOSIS — Z09 Encounter for follow-up examination after completed treatment for conditions other than malignant neoplasm: Secondary | ICD-10-CM | POA: Diagnosis not present

## 2019-06-24 DIAGNOSIS — R600 Localized edema: Secondary | ICD-10-CM | POA: Diagnosis not present

## 2019-06-24 DIAGNOSIS — I4891 Unspecified atrial fibrillation: Secondary | ICD-10-CM | POA: Diagnosis not present

## 2019-06-24 DIAGNOSIS — R040 Epistaxis: Secondary | ICD-10-CM | POA: Diagnosis not present

## 2019-06-24 DIAGNOSIS — Z8709 Personal history of other diseases of the respiratory system: Secondary | ICD-10-CM | POA: Diagnosis not present

## 2019-06-25 DIAGNOSIS — E559 Vitamin D deficiency, unspecified: Secondary | ICD-10-CM | POA: Diagnosis not present

## 2019-06-25 DIAGNOSIS — Z79899 Other long term (current) drug therapy: Secondary | ICD-10-CM | POA: Diagnosis not present

## 2019-06-25 DIAGNOSIS — E785 Hyperlipidemia, unspecified: Secondary | ICD-10-CM | POA: Diagnosis not present

## 2019-06-26 DIAGNOSIS — I4519 Other right bundle-branch block: Secondary | ICD-10-CM | POA: Diagnosis not present

## 2019-06-26 DIAGNOSIS — I4891 Unspecified atrial fibrillation: Secondary | ICD-10-CM | POA: Diagnosis not present

## 2019-06-26 DIAGNOSIS — I10 Essential (primary) hypertension: Secondary | ICD-10-CM | POA: Diagnosis not present

## 2019-06-26 DIAGNOSIS — Z7902 Long term (current) use of antithrombotics/antiplatelets: Secondary | ICD-10-CM | POA: Diagnosis not present

## 2019-06-26 DIAGNOSIS — T45515D Adverse effect of anticoagulants, subsequent encounter: Secondary | ICD-10-CM | POA: Diagnosis not present

## 2019-06-26 DIAGNOSIS — Z9981 Dependence on supplemental oxygen: Secondary | ICD-10-CM | POA: Diagnosis not present

## 2019-06-26 DIAGNOSIS — R040 Epistaxis: Secondary | ICD-10-CM | POA: Diagnosis not present

## 2019-06-26 DIAGNOSIS — J690 Pneumonitis due to inhalation of food and vomit: Secondary | ICD-10-CM | POA: Diagnosis not present

## 2019-06-26 DIAGNOSIS — J9601 Acute respiratory failure with hypoxia: Secondary | ICD-10-CM | POA: Diagnosis not present

## 2019-06-26 DIAGNOSIS — M1990 Unspecified osteoarthritis, unspecified site: Secondary | ICD-10-CM | POA: Diagnosis not present

## 2019-06-26 DIAGNOSIS — Z7982 Long term (current) use of aspirin: Secondary | ICD-10-CM | POA: Diagnosis not present

## 2019-06-26 DIAGNOSIS — E039 Hypothyroidism, unspecified: Secondary | ICD-10-CM | POA: Diagnosis not present

## 2019-06-26 DIAGNOSIS — J449 Chronic obstructive pulmonary disease, unspecified: Secondary | ICD-10-CM | POA: Diagnosis not present

## 2019-06-26 DIAGNOSIS — Z87891 Personal history of nicotine dependence: Secondary | ICD-10-CM | POA: Diagnosis not present

## 2019-06-27 ENCOUNTER — Ambulatory Visit: Payer: Medicare Other | Admitting: Cardiology

## 2019-06-27 ENCOUNTER — Other Ambulatory Visit: Payer: Self-pay

## 2019-06-27 ENCOUNTER — Encounter: Payer: Self-pay | Admitting: Cardiology

## 2019-06-27 VITALS — BP 140/78 | HR 78 | Ht 72.0 in | Wt 197.0 lb

## 2019-06-27 DIAGNOSIS — I10 Essential (primary) hypertension: Secondary | ICD-10-CM | POA: Diagnosis not present

## 2019-06-27 DIAGNOSIS — I4821 Permanent atrial fibrillation: Secondary | ICD-10-CM

## 2019-06-27 DIAGNOSIS — I480 Paroxysmal atrial fibrillation: Secondary | ICD-10-CM

## 2019-06-27 DIAGNOSIS — J690 Pneumonitis due to inhalation of food and vomit: Secondary | ICD-10-CM

## 2019-06-27 DIAGNOSIS — R040 Epistaxis: Secondary | ICD-10-CM

## 2019-06-27 HISTORY — DX: Epistaxis: R04.0

## 2019-06-27 HISTORY — DX: Pneumonitis due to inhalation of food and vomit: J69.0

## 2019-06-27 MED ORDER — CLOPIDOGREL BISULFATE 75 MG PO TABS: 75.0000 mg | ORAL_TABLET | Freq: Every day | ORAL | 0 refills | Status: DC

## 2019-06-27 NOTE — Patient Instructions (Signed)
Medication Instructions:  No medication changes *If you need a refill on your cardiac medications before your next appointment, please call your pharmacy*   Lab Work: None ordered If you have labs (blood work) drawn today and your tests are completely normal, you will receive your results only by: Marland Kitchen MyChart Message (if you have MyChart) OR . A paper copy in the mail If you have any lab test that is abnormal or we need to change your treatment, we will call you to review the results.   Testing/Procedures: No ordered   Follow-Up: At [facility], you and your health needs are our priority.  As part of our continuing mission to provide you with exceptional heart care, we have created designated Provider Care Teams.  These Care Teams include your primary Cardiologist (physician) and Advanced Practice Providers (APPs -  Physician Assistants and Nurse Practitioners) who all work together to provide you with the care you need, when you need it.  We recommend signing up for the patient portal called "MyChart".  Sign up information is provided on this After Visit Summary.  MyChart is used to connect with patients for Virtual Visits (Telemedicine).  Patients are able to view lab/test results, encounter notes, upcoming appointments, etc.  Non-urgent messages can be sent to your provider as well.   To learn more about what you can do with MyChart, go to [email or web address].    Your next appointment:   1 month(s)  The format for your next appointment:   In Person  Provider:   Belva Crome, MD   Other Instructions NA

## 2019-06-27 NOTE — Addendum Note (Signed)
Addended by: Eleonore Chiquito on: 06/27/2019 04:14 PM   Modules accepted: Orders

## 2019-06-27 NOTE — Progress Notes (Signed)
Cardiology Office Note:    Date:  06/27/2019   ID:  [name], DOB 11-16-1925, MRN [id number]  PCP:  Nicholos Johns, MD  Cardiologist:  Jenean Lindau, MD   Referring MD: Nicholos Johns, MD    ASSESSMENT:    1. Paroxysmal atrial fibrillation (HCC)   2. Essential hypertension   3. Permanent atrial fibrillation (HCC)   4. Epistaxis    PLAN:    In order of problems listed above:  1. Primary prevention stressed to the patient.  Importance of compliance with diet and medication stressed and he vocalized understanding. 2. Permanent atrial fibrillation: I discussed with the patient atrial fibrillation, disease process. Management and therapy including rate and rhythm control, anticoagulation benefits and potential risks were discussed extensively with the patient. Patient had multiple questions which were answered to patient's satisfaction. 3. I reviewed hospital records extensively.  Patient had aspiration from epistaxis.  I spoke to the primary care physician and talked about ENT evaluation to see if there is any curable treatment for his epistaxis.  If there is then we can resume anticoagulation.  Benefits and potential risks explained to him at extensive length.  If we can get a clearance from ENT after treatment and I would prefer that he goes on anticoagulation to reduce his stroke risk. 4. Essential hypertension: Blood pressure stable 5. Follow-up appointment in a month or earlier if he has any concerns.  I told him that aspirin and Plavix combination is not any significantly effective for atrial fibrillation but patient is keen on continuing it and going off any antiplatelet medication daily he gets an opinion from ENT doctor and we respect his wishes.   Medication Adjustments/Labs and Tests Ordered: Current medicines are reviewed at length with the patient today.  Concerns regarding medicines are outlined above.  No orders of the defined types were placed in this encounter.  No  orders of the defined types were placed in this encounter.    Chief Complaint  Patient presents with  . Follow-up    6 Months  . Hospitalization Follow-up     History of Present Illness:    Eddie Kelly is a 86 y.o. male patient.  He has past medical history of atrial fibrillation, essential hypertension and was on anticoagulation.  He developed significant epistaxis in his sleep and developed aspiration pneumonia.  He was treated and released.  At that point he was put on aspirin and Plavix and tolerating it well.  His daughter in law is here for his visit.  He denies any problems at this time and takes care of activities of daily living.  He ambulates age appropriately.  No chest pain orthopnea or PND.  At the time of my evaluation, the patient is alert awake oriented and in no distress.  Past Medical History:  Diagnosis Date  . A-fib ([place])   . Anxiety and depression   . CAD (coronary artery disease)   . Hypothyroidism   . PVC (premature ventricular contraction)   . Sinus bradycardia   . Vitamin D deficiency     Past Surgical History:  Procedure Laterality Date  . CATARACT EXTRACTION Bilateral   . EYE SURGERY Bilateral    Cat Sx  . HERNIA REPAIR      Current Medications: Current Meds  Medication Sig  . aspirin EC 81 MG tablet Take 81 mg by mouth daily.  . Cholecalciferol (VITAMIN D PO) Take 5,000 Units by mouth continuous dialysis.   Marland Kitchen clopidogrel (PLAVIX)  75 MG tablet Take 75 mg by mouth daily.  . cyanocobalamin (,VITAMIN B-12,) 1000 MCG/ML injection Inject 1,000 mcg into the muscle every 30 (thirty) days.  . diclofenac Sodium (VOLTAREN) 1 % GEL SMARTSIG:4 Gram(s) Topical 3 Times Daily PRN  . fluticasone (KLS ALLER-FLO) 50 MCG/ACT nasal spray Place 1 spray into both nostrils daily.  . furosemide (LASIX) 20 MG tablet Take 20 mg by mouth daily.  Marland Kitchen levothyroxine (SYNTHROID, LEVOTHROID) 88 MCG tablet Take 88 mcg by mouth daily.  Marland Kitchen loratadine (CLARITIN) 10 MG tablet  Take 10 mg by mouth daily.  Marland Kitchen losartan (COZAAR) 100 MG tablet Take 100 mg by mouth daily.  . metoprolol tartrate (LOPRESSOR) 25 MG tablet Take 25 mg by mouth 2 (two) times daily.  . Misc Natural Products (PROSTATE HEALTH) CAPS Take 1 capsule by mouth daily.  . montelukast (SINGULAIR) 10 MG tablet Take 10 mg by mouth at bedtime.  . Multiple Vitamin (MULTIVITAMIN WITH MINERALS) TABS tablet Take 1 tablet by mouth daily.  . nitroGLYCERIN (NITROSTAT) 0.4 MG SL tablet Place 0.4 mg under the tongue every 5 (five) minutes as needed.  . SPS 15 GM/60ML suspension Take 15 g by mouth once.  . tamsulosin (FLOMAX) 0.4 MG CAPS capsule Take 1 capsule by mouth daily.     Allergies:   Patient has no known allergies.   Social History   Socioeconomic History  . Marital status: Married    Spouse name: Not on file  . Number of children: Not on file  . Years of education: Not on file  . Highest education level: Not on file  Occupational History  . Not on file  Tobacco Use  . Smoking status: Former Smoker    Quit date: 1983    Years since quitting: 38.3  . Smokeless tobacco: Never Used  Substance and Sexual Activity  . Alcohol use: Yes    Alcohol/week: 1.0 standard drinks    Types: 1 Cans of beer per week    Comment: "every once in a blue moon"  . Drug use: No  . Sexual activity: Not on file  Other Topics Concern  . Not on file  Social History Narrative  . Not on file   Social Determinants of Health   Financial Resource Strain:   . Difficulty of Paying Living Expenses:   Food Insecurity:   . Worried About Programme researcher, broadcasting/film/video in the Last Year:   . Barista in the Last Year:   Transportation Needs:   . Freight forwarder (Medical):   Marland Kitchen Lack of Transportation (Non-Medical):   Physical Activity:   . Days of Exercise per Week:   . Minutes of Exercise per Session:   Stress:   . Feeling of Stress :   Social Connections:   . Frequency of Communication with Friends and Family:   .  Frequency of Social Gatherings with Friends and Family:   . Attends Religious Services:   . Active Member of Clubs or Organizations:   . Attends Banker Meetings:   Marland Kitchen Marital Status:      Family History: The patient's family history includes Cancer in his father; Diabetes in his brother; Heart attack in his father; Heart disease in his brother.  ROS:   Please see the history of present illness.    All other systems reviewed and are negative.  EKGs/Labs/Other Studies Reviewed:    The following studies were reviewed today: I reviewed [facility] records extensively.   Recent  Labs: 01/14/2019: ALT 15; Hemoglobin 13.1; Platelets 170; TSH 0.902 01/29/2019: BUN 23; Creatinine, Ser 1.12; Potassium 5.2; Sodium 145  Recent Lipid Panel    Component Value Date/Time   CHOL 149 01/14/2019 1055   TRIG 54 01/14/2019 1055   HDL 45 01/14/2019 1055   CHOLHDL 3.3 01/14/2019 1055   LDLCALC 93 01/14/2019 1055    Physical Exam:    VS:  BP 140/78   Pulse 78   Ht 6' (1.829 m)   Wt 197 lb (89.4 kg)   SpO2 96%   BMI 26.72 kg/m     Wt Readings from Last 3 Encounters:  06/27/19 197 lb (89.4 kg)  01/14/19 203 lb (92.1 kg)  04/12/18 204 lb (92.5 kg)     GEN: Patient is in no acute distress HEENT: Normal NECK: No JVD; No carotid bruits LYMPHATICS: No lymphadenopathy CARDIAC: Hear sounds irregular, 2/6 systolic murmur at the apex. RESPIRATORY:  Clear to auscultation without rales, wheezing or rhonchi  ABDOMEN: Soft, non-tender, non-distended MUSCULOSKELETAL:  No edema; No deformity  SKIN: Warm and dry NEUROLOGIC:  Alert and oriented x 3 PSYCHIATRIC:  Normal affect   Signed, Garwin Brothers, MD  06/27/2019 4:02 PM    [facility]

## 2019-06-28 DIAGNOSIS — Z7982 Long term (current) use of aspirin: Secondary | ICD-10-CM | POA: Diagnosis not present

## 2019-06-28 DIAGNOSIS — Z9981 Dependence on supplemental oxygen: Secondary | ICD-10-CM | POA: Diagnosis not present

## 2019-06-28 DIAGNOSIS — Z7902 Long term (current) use of antithrombotics/antiplatelets: Secondary | ICD-10-CM | POA: Diagnosis not present

## 2019-06-28 DIAGNOSIS — T45515D Adverse effect of anticoagulants, subsequent encounter: Secondary | ICD-10-CM | POA: Diagnosis not present

## 2019-06-28 DIAGNOSIS — J9601 Acute respiratory failure with hypoxia: Secondary | ICD-10-CM | POA: Diagnosis not present

## 2019-06-28 DIAGNOSIS — I4519 Other right bundle-branch block: Secondary | ICD-10-CM | POA: Diagnosis not present

## 2019-06-28 DIAGNOSIS — I10 Essential (primary) hypertension: Secondary | ICD-10-CM | POA: Diagnosis not present

## 2019-06-28 DIAGNOSIS — J449 Chronic obstructive pulmonary disease, unspecified: Secondary | ICD-10-CM | POA: Diagnosis not present

## 2019-06-28 DIAGNOSIS — M1990 Unspecified osteoarthritis, unspecified site: Secondary | ICD-10-CM | POA: Diagnosis not present

## 2019-06-28 DIAGNOSIS — Z87891 Personal history of nicotine dependence: Secondary | ICD-10-CM | POA: Diagnosis not present

## 2019-06-28 DIAGNOSIS — J690 Pneumonitis due to inhalation of food and vomit: Secondary | ICD-10-CM | POA: Diagnosis not present

## 2019-06-28 DIAGNOSIS — R040 Epistaxis: Secondary | ICD-10-CM | POA: Diagnosis not present

## 2019-06-28 DIAGNOSIS — E039 Hypothyroidism, unspecified: Secondary | ICD-10-CM | POA: Diagnosis not present

## 2019-06-28 DIAGNOSIS — I4891 Unspecified atrial fibrillation: Secondary | ICD-10-CM | POA: Diagnosis not present

## 2019-07-02 DIAGNOSIS — E039 Hypothyroidism, unspecified: Secondary | ICD-10-CM | POA: Diagnosis not present

## 2019-07-02 DIAGNOSIS — T45515D Adverse effect of anticoagulants, subsequent encounter: Secondary | ICD-10-CM | POA: Diagnosis not present

## 2019-07-02 DIAGNOSIS — J449 Chronic obstructive pulmonary disease, unspecified: Secondary | ICD-10-CM | POA: Diagnosis not present

## 2019-07-02 DIAGNOSIS — I4519 Other right bundle-branch block: Secondary | ICD-10-CM | POA: Diagnosis not present

## 2019-07-02 DIAGNOSIS — Z7902 Long term (current) use of antithrombotics/antiplatelets: Secondary | ICD-10-CM | POA: Diagnosis not present

## 2019-07-02 DIAGNOSIS — I10 Essential (primary) hypertension: Secondary | ICD-10-CM | POA: Diagnosis not present

## 2019-07-02 DIAGNOSIS — M1990 Unspecified osteoarthritis, unspecified site: Secondary | ICD-10-CM | POA: Diagnosis not present

## 2019-07-02 DIAGNOSIS — I4891 Unspecified atrial fibrillation: Secondary | ICD-10-CM | POA: Diagnosis not present

## 2019-07-02 DIAGNOSIS — Z9981 Dependence on supplemental oxygen: Secondary | ICD-10-CM | POA: Diagnosis not present

## 2019-07-02 DIAGNOSIS — J9601 Acute respiratory failure with hypoxia: Secondary | ICD-10-CM | POA: Diagnosis not present

## 2019-07-02 DIAGNOSIS — Z87891 Personal history of nicotine dependence: Secondary | ICD-10-CM | POA: Diagnosis not present

## 2019-07-02 DIAGNOSIS — R040 Epistaxis: Secondary | ICD-10-CM | POA: Diagnosis not present

## 2019-07-02 DIAGNOSIS — J690 Pneumonitis due to inhalation of food and vomit: Secondary | ICD-10-CM | POA: Diagnosis not present

## 2019-07-02 DIAGNOSIS — Z7982 Long term (current) use of aspirin: Secondary | ICD-10-CM | POA: Diagnosis not present

## 2019-07-03 DIAGNOSIS — Z9981 Dependence on supplemental oxygen: Secondary | ICD-10-CM | POA: Diagnosis not present

## 2019-07-03 DIAGNOSIS — J449 Chronic obstructive pulmonary disease, unspecified: Secondary | ICD-10-CM | POA: Diagnosis not present

## 2019-07-03 DIAGNOSIS — R040 Epistaxis: Secondary | ICD-10-CM | POA: Diagnosis not present

## 2019-07-03 DIAGNOSIS — Z7902 Long term (current) use of antithrombotics/antiplatelets: Secondary | ICD-10-CM | POA: Diagnosis not present

## 2019-07-03 DIAGNOSIS — Z87891 Personal history of nicotine dependence: Secondary | ICD-10-CM | POA: Diagnosis not present

## 2019-07-03 DIAGNOSIS — Z7982 Long term (current) use of aspirin: Secondary | ICD-10-CM | POA: Diagnosis not present

## 2019-07-03 DIAGNOSIS — E039 Hypothyroidism, unspecified: Secondary | ICD-10-CM | POA: Diagnosis not present

## 2019-07-03 DIAGNOSIS — J690 Pneumonitis due to inhalation of food and vomit: Secondary | ICD-10-CM | POA: Diagnosis not present

## 2019-07-03 DIAGNOSIS — J9601 Acute respiratory failure with hypoxia: Secondary | ICD-10-CM | POA: Diagnosis not present

## 2019-07-03 DIAGNOSIS — I4891 Unspecified atrial fibrillation: Secondary | ICD-10-CM | POA: Diagnosis not present

## 2019-07-03 DIAGNOSIS — M1990 Unspecified osteoarthritis, unspecified site: Secondary | ICD-10-CM | POA: Diagnosis not present

## 2019-07-03 DIAGNOSIS — I10 Essential (primary) hypertension: Secondary | ICD-10-CM | POA: Diagnosis not present

## 2019-07-03 DIAGNOSIS — I4519 Other right bundle-branch block: Secondary | ICD-10-CM | POA: Diagnosis not present

## 2019-07-03 DIAGNOSIS — T45515D Adverse effect of anticoagulants, subsequent encounter: Secondary | ICD-10-CM | POA: Diagnosis not present

## 2019-07-04 DIAGNOSIS — I4891 Unspecified atrial fibrillation: Secondary | ICD-10-CM | POA: Diagnosis not present

## 2019-07-04 DIAGNOSIS — Z9981 Dependence on supplemental oxygen: Secondary | ICD-10-CM | POA: Diagnosis not present

## 2019-07-04 DIAGNOSIS — J690 Pneumonitis due to inhalation of food and vomit: Secondary | ICD-10-CM | POA: Diagnosis not present

## 2019-07-04 DIAGNOSIS — J9601 Acute respiratory failure with hypoxia: Secondary | ICD-10-CM | POA: Diagnosis not present

## 2019-07-04 DIAGNOSIS — Z7982 Long term (current) use of aspirin: Secondary | ICD-10-CM | POA: Diagnosis not present

## 2019-07-04 DIAGNOSIS — E039 Hypothyroidism, unspecified: Secondary | ICD-10-CM | POA: Diagnosis not present

## 2019-07-04 DIAGNOSIS — I4519 Other right bundle-branch block: Secondary | ICD-10-CM | POA: Diagnosis not present

## 2019-07-04 DIAGNOSIS — I10 Essential (primary) hypertension: Secondary | ICD-10-CM | POA: Diagnosis not present

## 2019-07-04 DIAGNOSIS — T45515D Adverse effect of anticoagulants, subsequent encounter: Secondary | ICD-10-CM | POA: Diagnosis not present

## 2019-07-04 DIAGNOSIS — Z7902 Long term (current) use of antithrombotics/antiplatelets: Secondary | ICD-10-CM | POA: Diagnosis not present

## 2019-07-04 DIAGNOSIS — M1990 Unspecified osteoarthritis, unspecified site: Secondary | ICD-10-CM | POA: Diagnosis not present

## 2019-07-04 DIAGNOSIS — J449 Chronic obstructive pulmonary disease, unspecified: Secondary | ICD-10-CM | POA: Diagnosis not present

## 2019-07-04 DIAGNOSIS — R040 Epistaxis: Secondary | ICD-10-CM | POA: Diagnosis not present

## 2019-07-04 DIAGNOSIS — Z87891 Personal history of nicotine dependence: Secondary | ICD-10-CM | POA: Diagnosis not present

## 2019-07-09 DIAGNOSIS — Z87891 Personal history of nicotine dependence: Secondary | ICD-10-CM | POA: Diagnosis not present

## 2019-07-09 DIAGNOSIS — I4519 Other right bundle-branch block: Secondary | ICD-10-CM | POA: Diagnosis not present

## 2019-07-09 DIAGNOSIS — Z7902 Long term (current) use of antithrombotics/antiplatelets: Secondary | ICD-10-CM | POA: Diagnosis not present

## 2019-07-09 DIAGNOSIS — M1990 Unspecified osteoarthritis, unspecified site: Secondary | ICD-10-CM | POA: Diagnosis not present

## 2019-07-09 DIAGNOSIS — J9601 Acute respiratory failure with hypoxia: Secondary | ICD-10-CM | POA: Diagnosis not present

## 2019-07-09 DIAGNOSIS — Z7982 Long term (current) use of aspirin: Secondary | ICD-10-CM | POA: Diagnosis not present

## 2019-07-09 DIAGNOSIS — I4891 Unspecified atrial fibrillation: Secondary | ICD-10-CM | POA: Diagnosis not present

## 2019-07-09 DIAGNOSIS — J449 Chronic obstructive pulmonary disease, unspecified: Secondary | ICD-10-CM | POA: Diagnosis not present

## 2019-07-09 DIAGNOSIS — T45515D Adverse effect of anticoagulants, subsequent encounter: Secondary | ICD-10-CM | POA: Diagnosis not present

## 2019-07-09 DIAGNOSIS — Z9981 Dependence on supplemental oxygen: Secondary | ICD-10-CM | POA: Diagnosis not present

## 2019-07-09 DIAGNOSIS — E039 Hypothyroidism, unspecified: Secondary | ICD-10-CM | POA: Diagnosis not present

## 2019-07-09 DIAGNOSIS — I10 Essential (primary) hypertension: Secondary | ICD-10-CM | POA: Diagnosis not present

## 2019-07-09 DIAGNOSIS — R040 Epistaxis: Secondary | ICD-10-CM | POA: Diagnosis not present

## 2019-07-09 DIAGNOSIS — J690 Pneumonitis due to inhalation of food and vomit: Secondary | ICD-10-CM | POA: Diagnosis not present

## 2019-07-11 DIAGNOSIS — R040 Epistaxis: Secondary | ICD-10-CM | POA: Diagnosis not present

## 2019-07-11 DIAGNOSIS — Z87891 Personal history of nicotine dependence: Secondary | ICD-10-CM | POA: Diagnosis not present

## 2019-07-11 DIAGNOSIS — T45515D Adverse effect of anticoagulants, subsequent encounter: Secondary | ICD-10-CM | POA: Diagnosis not present

## 2019-07-11 DIAGNOSIS — J449 Chronic obstructive pulmonary disease, unspecified: Secondary | ICD-10-CM | POA: Diagnosis not present

## 2019-07-11 DIAGNOSIS — I4891 Unspecified atrial fibrillation: Secondary | ICD-10-CM | POA: Diagnosis not present

## 2019-07-11 DIAGNOSIS — I10 Essential (primary) hypertension: Secondary | ICD-10-CM | POA: Diagnosis not present

## 2019-07-11 DIAGNOSIS — M1990 Unspecified osteoarthritis, unspecified site: Secondary | ICD-10-CM | POA: Diagnosis not present

## 2019-07-11 DIAGNOSIS — Z7982 Long term (current) use of aspirin: Secondary | ICD-10-CM | POA: Diagnosis not present

## 2019-07-11 DIAGNOSIS — J690 Pneumonitis due to inhalation of food and vomit: Secondary | ICD-10-CM | POA: Diagnosis not present

## 2019-07-11 DIAGNOSIS — Z7902 Long term (current) use of antithrombotics/antiplatelets: Secondary | ICD-10-CM | POA: Diagnosis not present

## 2019-07-11 DIAGNOSIS — Z9981 Dependence on supplemental oxygen: Secondary | ICD-10-CM | POA: Diagnosis not present

## 2019-07-11 DIAGNOSIS — E039 Hypothyroidism, unspecified: Secondary | ICD-10-CM | POA: Diagnosis not present

## 2019-07-11 DIAGNOSIS — J9601 Acute respiratory failure with hypoxia: Secondary | ICD-10-CM | POA: Diagnosis not present

## 2019-07-11 DIAGNOSIS — I4519 Other right bundle-branch block: Secondary | ICD-10-CM | POA: Diagnosis not present

## 2019-07-12 DIAGNOSIS — Z7982 Long term (current) use of aspirin: Secondary | ICD-10-CM | POA: Diagnosis not present

## 2019-07-12 DIAGNOSIS — J342 Deviated nasal septum: Secondary | ICD-10-CM | POA: Diagnosis not present

## 2019-07-12 DIAGNOSIS — R040 Epistaxis: Secondary | ICD-10-CM | POA: Diagnosis not present

## 2019-07-12 DIAGNOSIS — Z7901 Long term (current) use of anticoagulants: Secondary | ICD-10-CM | POA: Diagnosis not present

## 2019-07-12 DIAGNOSIS — I4891 Unspecified atrial fibrillation: Secondary | ICD-10-CM | POA: Diagnosis not present

## 2019-07-12 NOTE — Progress Notes (Addendum)
[facility] - Clinic Note  07/17/2019     CHIEF COMPLAINT Patient presents for Retina Follow Up   HISTORY OF PRESENT ILLNESS: Eddie Kelly is a 86 y.o. male who presents to the clinic today for:   HPI    Retina Follow Up    Patient presents with  Wet AMD.  In right eye.  Duration of 5 weeks.  Since onset it is stable.  I, the attending physician,  performed the HPI with the patient and updated documentation appropriately.          Comments    5 week follow up- NVAMD OD, AMD OS- Vision appears stable since last visit.  He has been in the hospital since last visit.  His lungs filled with blood.  PCP changed his blood thinner.         Last edited by [name], MD on 07/17/2019  4:29 PM. (History)    pt states his vision is doing well, he states the day after he left here last time, he had a nose bleed in his sleep that drained into his lungs, daughter-in-law states he almost died, pt states he was taken off Xarelto, but needs to be on it to prevent a stroke  Referring physician: [name], MD [address] [place],  [place] [address]  HISTORICAL INFORMATION:   Selected notes from the MEDICAL RECORD NUMBER Referred by Dr. Zetta Bills for concern of CNVM OD LEE: 05/04/2019 BCVA: 20/70-1            20/50+1   CURRENT MEDICATIONS: No current outpatient medications on file. (Ophthalmic Drugs)   No current facility-administered medications for this visit. (Ophthalmic Drugs)   Current Outpatient Medications (Other)  Medication Sig  . aspirin EC 81 MG tablet Take 81 mg by mouth daily.  . Cholecalciferol (VITAMIN D PO) Take 5,000 Units by mouth continuous dialysis.   Marland Kitchen clopidogrel (PLAVIX) 75 MG tablet Take 1 tablet (75 mg total) by mouth daily.  . cyanocobalamin (,VITAMIN B-12,) 1000 MCG/ML injection Inject 1,000 mcg into the muscle every 30 (thirty) days.  . diclofenac Sodium (VOLTAREN) 1 % GEL SMARTSIG:4 Gram(s) Topical 3 Times Daily PRN  .  fluticasone (KLS ALLER-FLO) 50 MCG/ACT nasal spray Place 1 spray into both nostrils daily.  . furosemide (LASIX) 20 MG tablet Take 20 mg by mouth daily.  Marland Kitchen levothyroxine (SYNTHROID, LEVOTHROID) 88 MCG tablet Take 88 mcg by mouth daily.  Marland Kitchen loratadine (CLARITIN) 10 MG tablet Take 10 mg by mouth daily.  Marland Kitchen losartan (COZAAR) 100 MG tablet Take 100 mg by mouth daily.  . metoprolol tartrate (LOPRESSOR) 25 MG tablet Take 25 mg by mouth 2 (two) times daily.  . Misc Natural Products (PROSTATE HEALTH) CAPS Take 1 capsule by mouth daily.  . montelukast (SINGULAIR) 10 MG tablet Take 10 mg by mouth at bedtime.  . Multiple Vitamin (MULTIVITAMIN WITH MINERALS) TABS tablet Take 1 tablet by mouth daily.  . nitroGLYCERIN (NITROSTAT) 0.4 MG SL tablet Place 0.4 mg under the tongue every 5 (five) minutes as needed.  . SPS 15 GM/60ML suspension Take 15 g by mouth once.  . tamsulosin (FLOMAX) 0.4 MG CAPS capsule Take 1 capsule by mouth daily.   No current facility-administered medications for this visit. (Other)      REVIEW OF SYSTEMS: ROS    Positive for: Gastrointestinal, Neurological, Cardiovascular, Eyes   Negative for: Constitutional, Skin, Genitourinary, Musculoskeletal, HENT, Endocrine, Respiratory, Psychiatric, Allergic/Imm, Heme/Lymph   Last  edited by Joni Reining, COA on 07/17/2019  2:31 PM. (History)       ALLERGIES No Known Allergies  PAST MEDICAL HISTORY Past Medical History:  Diagnosis Date  . A-fib (HCC)   . Anxiety and depression   . CAD (coronary artery disease)   . Hypothyroidism   . PVC (premature ventricular contraction)   . Sinus bradycardia   . Vitamin D deficiency    Past Surgical History:  Procedure Laterality Date  . CATARACT EXTRACTION Bilateral   . EYE SURGERY Bilateral    Cat Sx  . HERNIA REPAIR      FAMILY HISTORY Family History  Problem Relation Age of Onset  . Cancer Father   . Heart attack Father   . Heart disease Brother   . Diabetes Brother      SOCIAL HISTORY Social History   Tobacco Use  . Smoking status: Former Smoker    Quit date: 1983    Years since quitting: 38.4  . Smokeless tobacco: Never Used  Substance Use Topics  . Alcohol use: Yes    Alcohol/week: 1.0 standard drinks    Types: 1 Cans of beer per week    Comment: "every once in a blue moon"  . Drug use: No         OPHTHALMIC EXAM:  Base Eye Exam    Visual Acuity (Snellen - Linear)      Right Left   Dist cc 20/80 20/40 -2   Dist ph cc 20/70 NI   Correction: Glasses       Tonometry (Tonopen, 2:37 PM)      Right Left   Pressure 10 9       Pupils      Dark Light Shape React APD   Right 4 4 Round Minimal None   Left 2 2 Round Minimal None       Visual Fields (Counting fingers)      Left Right    Full Full       Extraocular Movement      Right Left    Full Full       Neuro/Psych    Oriented x3: Yes   Mood/Affect: Normal       Dilation    Both eyes: 1.0% Mydriacyl, 2.5% Phenylephrine @ 2:37 PM        Slit Lamp and Fundus Exam    Slit Lamp Exam      Right Left   Lids/Lashes Dermatochalasis - upper lid Dermatochalasis - upper lid   Conjunctiva/Sclera White and quiet White and quiet   Cornea 2+ Punctate epithelial erosions, crocodile shegreen, well healed temporal cataract wounds, dry tear film 2+ Punctate epithelial erosions, crocodile shegreen, well healed temporal cataract wounds, dry tear film   Anterior Chamber Deep and quiet Deep and quiet   Iris Round and moderately dilated Round and poorly dilated to 4.62mm   Lens PC IOL in good position with open PC PC IOL in good position with open PC   Vitreous Vitreous syneresis Vitreous syneresis       Fundus Exam      Right Left   Disc mild Pallor, Sharp rim, peripapillary CNV with +heme/exudate ST disc -- improving mild Pallor, Sharp rim   C/D Ratio 0.4 0.4   Macula Flat, Blunted foveal reflex, +peripapillary heme, edema and exudate -- all improving Flat, Blunted foveal reflex,  fine Drusen, mild Retinal pigment epithelial mottling   Vessels Vascular attenuation, mild Tortuousity, peripapillary exudation along superior venule Vascular  attenuation, mild Tortuousity   Periphery Attached, no heme, mild RPE changes, focal exudate ST disc Attached, no heme, mild RPE changes          Refraction    Wearing Rx      Sphere Cylinder Axis Add   Right -3.00 +3.00 005 +2.75   Left -1.50 +3.00 167 +2.75          IMAGING AND PROCEDURES  Imaging and Procedures for @TODAY @  OCT, Retina - OU - Both Eyes       Right Eye Quality was borderline. Central Foveal Thickness: 268. Progression has improved. Findings include abnormal foveal contour, vitreous traction, epiretinal membrane, intraretinal fluid, intraretinal hyper-reflective material, subretinal fluid, subretinal hyper-reflective material, outer retinal atrophy, pigment epithelial detachment, retinal drusen  (Mild interval improvement in SRF; persistent VMT with central cystic changes).   Left Eye Quality was borderline. Central Foveal Thickness: 307. Progression has been stable. Findings include vitreous traction, abnormal foveal contour, no IRF, no SRF, subretinal hyper-reflective material, retinal drusen  (Stable VMT with central SRHM).   Notes *Images captured and stored on drive  Diagnosis / Impression:  OD: peripapillary CNVM with +SRF/IRF; VMT -- Mild interval improvement in SRF; persistent VMT with central cystic changes  OS: non-exu ARMD; +VMT -- Stable VMT with central SRHM  Clinical management:  See below  Abbreviations: NFP - Normal foveal profile. CME - cystoid macular edema. PED - pigment epithelial detachment. IRF - intraretinal fluid. SRF - subretinal fluid. EZ - ellipsoid zone. ERM - epiretinal membrane. ORA - outer retinal atrophy. ORT - outer retinal tubulation. SRHM - subretinal hyper-reflective material        Intravitreal Injection, Pharmacologic Agent - OD - Right Eye       Time  Out 07/17/2019. 2:27 PM. Confirmed correct patient, procedure, site, and patient consented.   Anesthesia Topical anesthesia was used. Anesthetic medications included Lidocaine 2%, Tetracaine 0.5%.   Procedure Preparation included 5% betadine to ocular surface, eyelid speculum. A supplied needle was used.   Injection:  1.25 mg Bevacizumab (AVASTIN) SOLN   NDC: 07/19/2019, Lot: [id number] , Expiration date: 07/25/2019   Route: Intravitreal, Site: Right Eye, Waste: 0 mL  Post-op Post injection exam found visual acuity of at least counting fingers. The patient tolerated the procedure well. There were no complications. The patient received written and verbal post procedure care education.                 ASSESSMENT/PLAN:    ICD-10-CM   1. Exudative age-related macular degeneration of right eye with active choroidal neovascularization (HCC)  H35.3211 Intravitreal Injection, Pharmacologic Agent - OD - Right Eye    Bevacizumab (AVASTIN) SOLN 1.25 mg  2. Retinal edema  H35.81 OCT, Retina - OU - Both Eyes  3. Intermediate stage nonexudative age-related macular degeneration of left eye  H35.3122   4. Vitreomacular adhesion of both eyes  H43.823   5. Essential hypertension  I10   6. Hypertensive retinopathy of both eyes  H35.033   7. Pseudophakia of both eyes  Z96.1   8. Diplopia  H53.2     1,2. Exudative age related macular degeneration, right eye   - incidental discovery of peripapillary CNV w/ heme and SRF OD during emergent visit for corneal abrasion OS             - S/P IVA OD #1 (03.22.21), #2 (04.19.21)  - OCT shows interval improvement in SRF and IRHM; persistent VMT with central cystic changes   - BCVA  stable at 20/70   - recommend IVA OD #3 today, 05.26.21  - pt wishes to be treated with IVA  - RBA of procedure discussed, questions answered  - informed consent obtained, signed and scanned, 03.22.21  - see procedure note  - f/u in 4-5 wks -- DFE/OCT, possible  injection  3. Age related macular degeneration, non-exudative, left eye  - The incidence, anatomy, and pathology of dry AMD, risk of progression, and the AREDS and AREDS 2 study including smoking risks discussed with patient.  - Recommend amsler grid monitoring  4. VMT OU  - mild  - monitor  5,6. Hypertensive retinopathy OU  - discussed importance of tight BP control  - monitor  7. Pseudophakia OU  - s/p CE/IOL OU  - IOLs in good position, doing well  - monitor  8. Diplopia  - pt with long-standing history of diplopia  - has prism in glasses  - pt reports recent improvement in diplopia over the last several weeks -- ?affected by peripapillary CNV   Ophthalmic Meds Ordered this visit:  Meds ordered this encounter  Medications  . Bevacizumab (AVASTIN) SOLN 1.25 mg       Return in about 5 weeks (around 08/21/2019) for f/u [name], DFE, OCT.  There are no Patient Instructions on file for this visit.   Explained the diagnoses, plan, and follow up with the patient and they expressed understanding.  Patient expressed understanding of the importance of proper follow up care.   This document serves as a record of services personally performed by Gardiner Sleeper, MD, PhD. It was created on their behalf by Ernest Mallick, OA, an ophthalmic assistant. The creation of this record is the provider's dictation and/or activities during the visit.    Electronically signed by: Ernest Mallick, OA 05.26.2021 4:34 PM  Gardiner Sleeper, M.D., Ph.D. Diseases & Surgery of the Retina and [place] 07/17/2019   I have reviewed the above documentation for accuracy and completeness, and I agree with the above. Gardiner Sleeper, M.D., Ph.D. 07/17/19 4:34 PM   Abbreviations: M myopia (nearsighted); A astigmatism; H hyperopia (farsighted); P presbyopia; Mrx spectacle prescription;  CTL contact lenses; OD right eye; OS left eye; OU both eyes  XT exotropia; ET  esotropia; PEK punctate epithelial keratitis; PEE punctate epithelial erosions; DES dry eye syndrome; MGD meibomian gland dysfunction; ATs artificial tears; PFAT's preservative free artificial tears; [place] nuclear sclerotic cataract; PSC posterior subcapsular cataract; ERM epi-retinal membrane; PVD posterior vitreous detachment; RD retinal detachment; DM diabetes mellitus; DR diabetic retinopathy; NPDR non-proliferative diabetic retinopathy; PDR proliferative diabetic retinopathy; CSME clinically significant macular edema; DME diabetic macular edema; dbh dot blot hemorrhages; CWS cotton wool spot; POAG primary open angle glaucoma; C/D cup-to-disc ratio; HVF humphrey visual field; GVF goldmann visual field; OCT optical coherence tomography; IOP intraocular pressure; BRVO Branch retinal vein occlusion; CRVO central retinal vein occlusion; CRAO central retinal artery occlusion; BRAO branch retinal artery occlusion; RT retinal tear; SB scleral buckle; PPV pars plana vitrectomy; VH Vitreous hemorrhage; PRP panretinal laser photocoagulation; IVK intravitreal kenalog; VMT vitreomacular traction; MH Macular hole;  NVD neovascularization of the disc; NVE neovascularization elsewhere; AREDS age related eye disease study; ARMD age related macular degeneration; POAG primary open angle glaucoma; EBMD epithelial/anterior basement membrane dystrophy; ACIOL anterior chamber intraocular lens; IOL intraocular lens; PCIOL posterior chamber intraocular lens; Phaco/IOL phacoemulsification with intraocular lens placement; [place] photorefractive keratectomy; LASIK laser assisted in situ keratomileusis; HTN hypertension; DM diabetes mellitus;  COPD chronic obstructive pulmonary disease

## 2019-07-16 DIAGNOSIS — J449 Chronic obstructive pulmonary disease, unspecified: Secondary | ICD-10-CM | POA: Diagnosis not present

## 2019-07-16 DIAGNOSIS — I10 Essential (primary) hypertension: Secondary | ICD-10-CM | POA: Diagnosis not present

## 2019-07-16 DIAGNOSIS — J690 Pneumonitis due to inhalation of food and vomit: Secondary | ICD-10-CM | POA: Diagnosis not present

## 2019-07-16 DIAGNOSIS — I4891 Unspecified atrial fibrillation: Secondary | ICD-10-CM | POA: Diagnosis not present

## 2019-07-16 DIAGNOSIS — J9601 Acute respiratory failure with hypoxia: Secondary | ICD-10-CM | POA: Diagnosis not present

## 2019-07-16 DIAGNOSIS — Z9981 Dependence on supplemental oxygen: Secondary | ICD-10-CM | POA: Diagnosis not present

## 2019-07-16 DIAGNOSIS — E039 Hypothyroidism, unspecified: Secondary | ICD-10-CM | POA: Diagnosis not present

## 2019-07-16 DIAGNOSIS — R040 Epistaxis: Secondary | ICD-10-CM | POA: Diagnosis not present

## 2019-07-16 DIAGNOSIS — I4519 Other right bundle-branch block: Secondary | ICD-10-CM | POA: Diagnosis not present

## 2019-07-16 DIAGNOSIS — T45515D Adverse effect of anticoagulants, subsequent encounter: Secondary | ICD-10-CM | POA: Diagnosis not present

## 2019-07-16 DIAGNOSIS — M1990 Unspecified osteoarthritis, unspecified site: Secondary | ICD-10-CM | POA: Diagnosis not present

## 2019-07-16 DIAGNOSIS — Z7982 Long term (current) use of aspirin: Secondary | ICD-10-CM | POA: Diagnosis not present

## 2019-07-16 DIAGNOSIS — Z7902 Long term (current) use of antithrombotics/antiplatelets: Secondary | ICD-10-CM | POA: Diagnosis not present

## 2019-07-16 DIAGNOSIS — Z87891 Personal history of nicotine dependence: Secondary | ICD-10-CM | POA: Diagnosis not present

## 2019-07-17 ENCOUNTER — Ambulatory Visit (INDEPENDENT_AMBULATORY_CARE_PROVIDER_SITE_OTHER): Payer: Medicare Other | Admitting: Ophthalmology

## 2019-07-17 ENCOUNTER — Other Ambulatory Visit: Payer: Self-pay

## 2019-07-17 ENCOUNTER — Encounter (INDEPENDENT_AMBULATORY_CARE_PROVIDER_SITE_OTHER): Payer: Self-pay | Admitting: Ophthalmology

## 2019-07-17 DIAGNOSIS — H3581 Retinal edema: Secondary | ICD-10-CM | POA: Diagnosis not present

## 2019-07-17 DIAGNOSIS — H353211 Exudative age-related macular degeneration, right eye, with active choroidal neovascularization: Secondary | ICD-10-CM

## 2019-07-17 DIAGNOSIS — H353122 Nonexudative age-related macular degeneration, left eye, intermediate dry stage: Secondary | ICD-10-CM

## 2019-07-17 DIAGNOSIS — H35033 Hypertensive retinopathy, bilateral: Secondary | ICD-10-CM

## 2019-07-17 DIAGNOSIS — H43823 Vitreomacular adhesion, bilateral: Secondary | ICD-10-CM

## 2019-07-17 DIAGNOSIS — I10 Essential (primary) hypertension: Secondary | ICD-10-CM | POA: Diagnosis not present

## 2019-07-17 DIAGNOSIS — Z961 Presence of intraocular lens: Secondary | ICD-10-CM

## 2019-07-17 DIAGNOSIS — H532 Diplopia: Secondary | ICD-10-CM

## 2019-07-17 MED ORDER — BEVACIZUMAB CHEMO INJECTION 1.25MG/0.05ML SYRINGE FOR KALEIDOSCOPE
1.2500 mg | INTRAVITREAL | Status: AC | PRN
  Administered 2019-07-17: 1.25 mg via INTRAVITREAL

## 2019-07-24 DIAGNOSIS — M1990 Unspecified osteoarthritis, unspecified site: Secondary | ICD-10-CM | POA: Diagnosis not present

## 2019-07-24 DIAGNOSIS — J9601 Acute respiratory failure with hypoxia: Secondary | ICD-10-CM | POA: Diagnosis not present

## 2019-07-24 DIAGNOSIS — J449 Chronic obstructive pulmonary disease, unspecified: Secondary | ICD-10-CM | POA: Diagnosis not present

## 2019-07-24 DIAGNOSIS — Z9981 Dependence on supplemental oxygen: Secondary | ICD-10-CM | POA: Diagnosis not present

## 2019-07-24 DIAGNOSIS — E039 Hypothyroidism, unspecified: Secondary | ICD-10-CM | POA: Diagnosis not present

## 2019-07-24 DIAGNOSIS — J690 Pneumonitis due to inhalation of food and vomit: Secondary | ICD-10-CM | POA: Diagnosis not present

## 2019-07-24 DIAGNOSIS — Z87891 Personal history of nicotine dependence: Secondary | ICD-10-CM | POA: Diagnosis not present

## 2019-07-24 DIAGNOSIS — Z7982 Long term (current) use of aspirin: Secondary | ICD-10-CM | POA: Diagnosis not present

## 2019-07-24 DIAGNOSIS — T45515D Adverse effect of anticoagulants, subsequent encounter: Secondary | ICD-10-CM | POA: Diagnosis not present

## 2019-07-24 DIAGNOSIS — I10 Essential (primary) hypertension: Secondary | ICD-10-CM | POA: Diagnosis not present

## 2019-07-24 DIAGNOSIS — Z7902 Long term (current) use of antithrombotics/antiplatelets: Secondary | ICD-10-CM | POA: Diagnosis not present

## 2019-07-24 DIAGNOSIS — I4891 Unspecified atrial fibrillation: Secondary | ICD-10-CM | POA: Diagnosis not present

## 2019-07-24 DIAGNOSIS — R040 Epistaxis: Secondary | ICD-10-CM | POA: Diagnosis not present

## 2019-07-24 DIAGNOSIS — I4519 Other right bundle-branch block: Secondary | ICD-10-CM | POA: Diagnosis not present

## 2019-07-31 ENCOUNTER — Ambulatory Visit: Payer: Medicare Other | Admitting: Cardiology

## 2019-07-31 ENCOUNTER — Encounter: Payer: Self-pay | Admitting: Cardiology

## 2019-07-31 ENCOUNTER — Other Ambulatory Visit: Payer: Self-pay

## 2019-07-31 VITALS — BP 140/70 | HR 69 | Ht 72.0 in | Wt 194.0 lb

## 2019-07-31 DIAGNOSIS — I10 Essential (primary) hypertension: Secondary | ICD-10-CM | POA: Diagnosis not present

## 2019-07-31 DIAGNOSIS — I4821 Permanent atrial fibrillation: Secondary | ICD-10-CM | POA: Diagnosis not present

## 2019-07-31 NOTE — Patient Instructions (Signed)
Medication Instructions:  No medication changes. *If you need a refill on your cardiac medications before your next appointment, please call your pharmacy*   Lab Work: None ordered If you have labs (blood work) drawn today and your tests are completely normal, you will receive your results only by: Marland Kitchen MyChart Message (if you have MyChart) OR . A paper copy in the mail If you have any lab test that is abnormal or we need to change your treatment, we will call you to review the results.   Testing/Procedures: None ordered   Follow-Up: At [facility], you and your health needs are our priority.  As part of our continuing mission to provide you with exceptional heart care, we have created designated Provider Care Teams.  These Care Teams include your primary Cardiologist (physician) and Advanced Practice Providers (APPs -  Physician Assistants and Nurse Practitioners) who all work together to provide you with the care you need, when you need it.  We recommend signing up for the patient portal called "MyChart".  Sign up information is provided on this After Visit Summary.  MyChart is used to connect with patients for Virtual Visits (Telemedicine).  Patients are able to view lab/test results, encounter notes, upcoming appointments, etc.  Non-urgent messages can be sent to your provider as well.   To learn more about what you can do with MyChart, go to [email or web address].    Your next appointment:   6 month(s)  The format for your next appointment:   In Person  Provider:   Belva Crome, MD   Other Instructions A referral has been placed Dr. Eppie Gibson [facility] at [facility] [address] [place], [place]  [address] [phone]

## 2019-07-31 NOTE — Progress Notes (Signed)
Cardiology Office Note:    Date:  07/31/2019   ID:  Eddie Kelly, DOB September 05, 1925, MRN [id number]  PCP:  [name], MD  Cardiologist:  Garwin Brothers, MD   Referring MD: [name], MD    ASSESSMENT:    1. Essential hypertension   2. Permanent atrial fibrillation (HCC)    PLAN:    In order of problems listed above:  1. Primary prevention stressed with the patient.  Importance of compliance with diet medication stressed and vocalized understanding. 2. Permanent atrial fibrillation:I discussed with the patient atrial fibrillation, disease process. Management and therapy including rate and rhythm control, anticoagulation benefits and potential risks were discussed extensively with the patient. Patient had multiple questions which were answered to patient's satisfaction.  Because of his significant scare with aspiration pneumonia and he feels that this was a near death experience he does not want to go on anticoagulation again.  He is at risk of stroke.  He is 6 but functionally he is probably more like a decade younger.  He appears younger than stated age.  In view of this I offered him the watchman device and he is willing to see Dr. Andrey Farmer at [facility] to discuss these issues.  We will set him is appointment with the same.  For this procedure he wanted to go to a tertiary referral center and we respect his wishes. 3. Essential hypertension: Blood pressure stable 4. Patient will be seen in follow-up appointment in 6 months or earlier if the patient has any concerns    Medication Adjustments/Labs and Tests Ordered: Current medicines are reviewed at length with the patient today.  Concerns regarding medicines are outlined above.  No orders of the defined types were placed in this encounter.  No orders of the defined types were placed in this encounter.    No chief complaint on file.    History of Present Illness:    Eddie Kelly is a 86 y.o. male.  Patient has  past medical history of permanent atrial fibrillation and essential hypertension.  He denies any problems at this time.  He has had serious nosebleed with aspiration so he does not want to go on anticoagulation.  He is taking aspirin and clopidogrel.  No chest pain orthopnea or PND.  His family member accompanies for this visit today.  Past Medical History:  Diagnosis Date  . A-fib (HCC)   . Anxiety and depression   . CAD (coronary artery disease)   . Hypothyroidism   . PVC (premature ventricular contraction)   . Sinus bradycardia   . Vitamin D deficiency     Past Surgical History:  Procedure Laterality Date  . CATARACT EXTRACTION Bilateral   . EYE SURGERY Bilateral    Cat Sx  . HERNIA REPAIR      Current Medications: Current Meds  Medication Sig  . aspirin EC 81 MG tablet Take 81 mg by mouth daily.  . Cholecalciferol (VITAMIN D PO) Take 5,000 Units by mouth continuous dialysis.   Marland Kitchen clopidogrel (PLAVIX) 75 MG tablet Take 1 tablet (75 mg total) by mouth daily.  . cyanocobalamin (,VITAMIN B-12,) 1000 MCG/ML injection Inject 1,000 mcg into the muscle every 30 (thirty) days.  . diclofenac Sodium (VOLTAREN) 1 % GEL SMARTSIG:4 Gram(s) Topical 3 Times Daily PRN  . fluticasone (KLS ALLER-FLO) 50 MCG/ACT nasal spray Place 1 spray into both nostrils daily.  . furosemide (LASIX) 20 MG tablet Take 20 mg by mouth daily.  Marland Kitchen levothyroxine (  SYNTHROID, LEVOTHROID) 88 MCG tablet Take 88 mcg by mouth daily.  Marland Kitchen loratadine (CLARITIN) 10 MG tablet Take 10 mg by mouth daily.  Marland Kitchen losartan (COZAAR) 100 MG tablet Take 100 mg by mouth daily.  . metoprolol tartrate (LOPRESSOR) 25 MG tablet Take 25 mg by mouth 2 (two) times daily.  . Misc Natural Products (PROSTATE HEALTH) CAPS Take 1 capsule by mouth daily.  . montelukast (SINGULAIR) 10 MG tablet Take 10 mg by mouth at bedtime.  . Multiple Vitamin (MULTIVITAMIN WITH MINERALS) TABS tablet Take 1 tablet by mouth daily.  . nitroGLYCERIN (NITROSTAT) 0.4 MG SL  tablet Place 0.4 mg under the tongue every 5 (five) minutes as needed.  . SPS 15 GM/60ML suspension Take 15 g by mouth once.  . tamsulosin (FLOMAX) 0.4 MG CAPS capsule Take 1 capsule by mouth daily.     Allergies:   Patient has no known allergies.   Social History   Socioeconomic History  . Marital status: Married    Spouse name: Not on file  . Number of children: Not on file  . Years of education: Not on file  . Highest education level: Not on file  Occupational History  . Not on file  Tobacco Use  . Smoking status: Former Smoker    Quit date: 1983    Years since quitting: 38.4  . Smokeless tobacco: Never Used  Substance and Sexual Activity  . Alcohol use: Yes    Alcohol/week: 1.0 standard drinks    Types: 1 Cans of beer per week    Comment: "every once in a blue moon"  . Drug use: No  . Sexual activity: Not on file  Other Topics Concern  . Not on file  Social History Narrative  . Not on file   Social Determinants of Health   Financial Resource Strain:   . Difficulty of Paying Living Expenses:   Food Insecurity:   . Worried About Charity fundraiser in the Last Year:   . Arboriculturist in the Last Year:   Transportation Needs:   . Film/video editor (Medical):   Marland Kitchen Lack of Transportation (Non-Medical):   Physical Activity:   . Days of Exercise per Week:   . Minutes of Exercise per Session:   Stress:   . Feeling of Stress :   Social Connections:   . Frequency of Communication with Friends and Family:   . Frequency of Social Gatherings with Friends and Family:   . Attends Religious Services:   . Active Member of Clubs or Organizations:   . Attends Archivist Meetings:   Marland Kitchen Marital Status:      Family History: The patient's family history includes Cancer in his father; Diabetes in his brother; Heart attack in his father; Heart disease in his brother.  ROS:   Please see the history of present illness.    All other systems reviewed and are  negative.  EKGs/Labs/Other Studies Reviewed:    The following studies were reviewed today: EKG reveals atrial fibrillation with well-controlled ventricular rate.   Recent Labs: 01/14/2019: ALT 15; Hemoglobin 13.1; Platelets 170; TSH 0.902 01/29/2019: BUN 23; Creatinine, Ser 1.12; Potassium 5.2; Sodium 145  Recent Lipid Panel    Component Value Date/Time   CHOL 149 01/14/2019 1055   TRIG 54 01/14/2019 1055   HDL 45 01/14/2019 1055   CHOLHDL 3.3 01/14/2019 1055   LDLCALC 93 01/14/2019 1055    Physical Exam:    VS:  BP 140/70  Pulse 69   Ht 6' (1.829 m)   Wt 194 lb (88 kg)   SpO2 97%   BMI 26.31 kg/m     Wt Readings from Last 3 Encounters:  07/31/19 194 lb (88 kg)  06/27/19 197 lb (89.4 kg)  01/14/19 203 lb (92.1 kg)     GEN: Patient is in no acute distress HEENT: Normal NECK: No JVD; No carotid bruits LYMPHATICS: No lymphadenopathy CARDIAC: Hear sounds regular, 2/6 systolic murmur at the apex. RESPIRATORY:  Clear to auscultation without rales, wheezing or rhonchi  ABDOMEN: Soft, non-tender, non-distended MUSCULOSKELETAL:  No edema; No deformity  SKIN: Warm and dry NEUROLOGIC:  Alert and oriented x 3 PSYCHIATRIC:  Normal affect   Signed, Garwin Brothers, MD  07/31/2019 3:49 PM    [facility]

## 2019-08-05 ENCOUNTER — Other Ambulatory Visit: Payer: Self-pay | Admitting: Cardiology

## 2019-08-08 NOTE — Progress Notes (Signed)
[facility] - Clinic Note  08/14/2019     CHIEF COMPLAINT Patient presents for Retina Follow Up   HISTORY OF PRESENT ILLNESS: Eddie Kelly is a 86 y.o. male who presents to the clinic today for:   HPI    Retina Follow Up    Patient presents with  Wet AMD.  In right eye.  Severity is moderate.  Duration of 5 weeks.  Since onset it is stable.  I, the attending physician,  performed the HPI with the patient and updated documentation appropriately.          Comments    Patient states vision the same OU.       Last edited by [name], MD on 08/14/2019  8:58 AM. (History)    pt states he cannot tell any difference in his vision, he is no longer taking Xarelto  Referring physician: [name], MD [address] [place],  [place] [address]  HISTORICAL INFORMATION:   Selected notes from the MEDICAL RECORD NUMBER Referred by Dr. Zetta Bills for concern of CNVM OD LEE: 05/04/2019 BCVA: 20/70-1            20/50+1   CURRENT MEDICATIONS: No current outpatient medications on file. (Ophthalmic Drugs)   No current facility-administered medications for this visit. (Ophthalmic Drugs)   Current Outpatient Medications (Other)  Medication Sig  . aspirin EC 81 MG tablet Take 81 mg by mouth daily.  . Cholecalciferol (VITAMIN D PO) Take 5,000 Units by mouth continuous dialysis.   Marland Kitchen clopidogrel (PLAVIX) 75 MG tablet TAKE 1 TABLET(75 MG) BY MOUTH DAILY  . cyanocobalamin (,VITAMIN B-12,) 1000 MCG/ML injection Inject 1,000 mcg into the muscle every 30 (thirty) days.  . diclofenac Sodium (VOLTAREN) 1 % GEL SMARTSIG:4 Gram(s) Topical 3 Times Daily PRN  . fluticasone (KLS ALLER-FLO) 50 MCG/ACT nasal spray Place 1 spray into both nostrils daily.  . furosemide (LASIX) 20 MG tablet Take 20 mg by mouth daily.  Marland Kitchen levothyroxine (SYNTHROID, LEVOTHROID) 88 MCG tablet Take 88 mcg by mouth daily.  Marland Kitchen loratadine (CLARITIN) 10 MG tablet Take 10 mg by mouth daily.  Marland Kitchen losartan  (COZAAR) 100 MG tablet Take 100 mg by mouth daily.  . metoprolol tartrate (LOPRESSOR) 25 MG tablet Take 25 mg by mouth 2 (two) times daily.  . Misc Natural Products (PROSTATE HEALTH) CAPS Take 1 capsule by mouth daily.  . montelukast (SINGULAIR) 10 MG tablet Take 10 mg by mouth at bedtime.  . Multiple Vitamin (MULTIVITAMIN WITH MINERALS) TABS tablet Take 1 tablet by mouth daily.  . nitroGLYCERIN (NITROSTAT) 0.4 MG SL tablet Place 0.4 mg under the tongue every 5 (five) minutes as needed.  . SPS 15 GM/60ML suspension Take 15 g by mouth once.  . tamsulosin (FLOMAX) 0.4 MG CAPS capsule Take 1 capsule by mouth daily.   No current facility-administered medications for this visit. (Other)      REVIEW OF SYSTEMS: ROS    Positive for: Gastrointestinal, Neurological, Cardiovascular, Eyes   Negative for: Constitutional, Skin, Genitourinary, Musculoskeletal, HENT, Endocrine, Respiratory, Psychiatric, Allergic/Imm, Heme/Lymph   Last edited by Annalee Genta D, COT on 08/14/2019  8:21 AM. (History)       ALLERGIES No Known Allergies  PAST MEDICAL HISTORY Past Medical History:  Diagnosis Date  . A-fib (HCC)   . Anxiety and depression   . CAD (coronary artery disease)   . Hypothyroidism   . PVC (premature ventricular contraction)   . Sinus bradycardia   .  Vitamin D deficiency    Past Surgical History:  Procedure Laterality Date  . CATARACT EXTRACTION Bilateral   . EYE SURGERY Bilateral    Cat Sx  . HERNIA REPAIR      FAMILY HISTORY Family History  Problem Relation Age of Onset  . Cancer Father   . Heart attack Father   . Heart disease Brother   . Diabetes Brother     SOCIAL HISTORY Social History   Tobacco Use  . Smoking status: Former Smoker    Quit date: 1983    Years since quitting: 38.5  . Smokeless tobacco: Never Used  Vaping Use  . Vaping Use: Never used  Substance Use Topics  . Alcohol use: Yes    Alcohol/week: 1.0 standard drink    Types: 1 Cans of beer per  week    Comment: "every once in a blue moon"  . Drug use: No         OPHTHALMIC EXAM:  Base Eye Exam    Visual Acuity (Snellen - Linear)      Right Left   Dist cc 20/80 -2 20/30 -1   Dist ph cc 20/70 -2 20/25 -1       Tonometry (Tonopen, 8:27 AM)      Right Left   Pressure 10 08       Pupils      Dark Light Shape React APD   Right 4 4 Round Minimal None   Left 2 2 Round Minimal None       Visual Fields (Counting fingers)      Left Right    Full Full       Extraocular Movement      Right Left    Full, Ortho Full, Ortho       Neuro/Psych    Oriented x3: Yes   Mood/Affect: Normal       Dilation    Both eyes: 1.0% Mydriacyl, 2.5% Phenylephrine @ 8:27 AM        Slit Lamp and Fundus Exam    Slit Lamp Exam      Right Left   Lids/Lashes Dermatochalasis - upper lid Dermatochalasis - upper lid   Conjunctiva/Sclera White and quiet White and quiet   Cornea 2+ Punctate epithelial erosions, crocodile shegreen, well healed temporal cataract wounds, dry tear film 2+ Punctate epithelial erosions, crocodile shegreen, well healed temporal cataract wounds, dry tear film   Anterior Chamber Deep and quiet Deep and quiet   Iris Round and moderately dilated Round and poorly dilated to 4.40mm   Lens PC IOL in good position with open PC PC IOL in good position with open PC   Vitreous Vitreous syneresis Vitreous syneresis       Fundus Exam      Right Left   Disc mild Pallor, Sharp rim, peripapillary CNV with +heme/exudate ST disc -- improving mild Pallor, Sharp rim   C/D Ratio 0.4 0.4   Macula Flat, Blunted foveal reflex, +peripapillary heme, edema and exudate -- all improving Flat, Blunted foveal reflex, fine Drusen, mild Retinal pigment epithelial mottling   Vessels Vascular attenuation, mild Tortuousity, perivascular exudation along superior venule Vascular attenuation, mild Tortuousity   Periphery Attached, mild RPE changes, No heme  Attached, no heme, mild RPE changes           Refraction    Wearing Rx      Sphere Cylinder Axis Add   Right -3.00 +3.00 005 +2.75   Left -1.50 +3.00 167 +2.75  IMAGING AND PROCEDURES  Imaging and Procedures for @TODAY @  OCT, Retina - OU - Both Eyes       Right Eye Quality was good. Central Foveal Thickness: 272. Progression has improved. Findings include abnormal foveal contour, vitreous traction, epiretinal membrane, intraretinal fluid, intraretinal hyper-reflective material, subretinal fluid, subretinal hyper-reflective material, outer retinal atrophy, pigment epithelial detachment, retinal drusen  (Mild interval improvement in SRF; persistent VMT with central cystic changes).   Left Eye Quality was good. Central Foveal Thickness: 308. Progression has been stable. Findings include vitreous traction, abnormal foveal contour, no SRF, subretinal hyper-reflective material, retinal drusen , intraretinal fluid (Stable VMT with central SRHM and cystic changes).   Notes *Images captured and stored on drive  Diagnosis / Impression:  OD: peripapillary CNVM with +SRF/IRF; VMT -- Mild interval improvement in SRF; persistent VMT with central cystic changes  OS: non-exu ARMD; +VMT -- Stable VMT with central SRHM and cystic changes  Clinical management:  See below  Abbreviations: NFP - Normal foveal profile. CME - cystoid macular edema. PED - pigment epithelial detachment. IRF - intraretinal fluid. SRF - subretinal fluid. EZ - ellipsoid zone. ERM - epiretinal membrane. ORA - outer retinal atrophy. ORT - outer retinal tubulation. SRHM - subretinal hyper-reflective material        Intravitreal Injection, Pharmacologic Agent - OD - Right Eye       Time Out 08/14/2019. 8:46 AM. Confirmed correct patient, procedure, site, and patient consented.   Anesthesia Topical anesthesia was used. Anesthetic medications included Lidocaine 2%, Proparacaine 0.5%.   Procedure Preparation included 5% betadine to ocular surface,  eyelid speculum. A supplied needle was used.   Injection:  1.25 mg Bevacizumab (AVASTIN) SOLN   NDC: 08/16/2019, Lot: [id number] , Expiration date: 10/16/2019   Route: Intravitreal, Site: Right Eye, Waste: 0 mL  Post-op Post injection exam found visual acuity of at least counting fingers. The patient tolerated the procedure well. There were no complications. The patient received written and verbal post procedure care education.                 ASSESSMENT/PLAN:    ICD-10-CM   1. Exudative age-related macular degeneration of right eye with active choroidal neovascularization (HCC)  H35.3211 Intravitreal Injection, Pharmacologic Agent - OD - Right Eye    Bevacizumab (AVASTIN) SOLN 1.25 mg  2. Retinal edema  H35.81 OCT, Retina - OU - Both Eyes  3. Intermediate stage nonexudative age-related macular degeneration of left eye  H35.3122   4. Vitreomacular adhesion of both eyes  H43.823   5. Essential hypertension  I10   6. Hypertensive retinopathy of both eyes  H35.033   7. Pseudophakia of both eyes  Z96.1   8. Diplopia  H53.2     1,2. Exudative age related macular degeneration, right eye   - incidental discovery of peripapillary CNV w/ heme and SRF OD during emergent visit for corneal abrasion OS             - S/P IVA OD #1 (03.22.21), #2 (04.19.21), #3 (05.26.21)  - OCT shows interval improvement in SRF and IRHM; persistent VMT with central cystic changes   - BCVA stable at 20/70   - recommend IVA OD #4 today, 06.23.21  - pt wishes to be treated with IVA  - RBA of procedure discussed, questions answered  - informed consent obtained, signed and scanned, 03.22.21  - see procedure note  - f/u in 4-5 wks -- DFE/OCT, possible injection  3. Age related macular degeneration, non-exudative, left  eye  - The incidence, anatomy, and pathology of dry AMD, risk of progression, and the AREDS and AREDS 2 study including smoking risks discussed with patient.  - Recommend amsler grid  monitoring  4. VMT OU  - mild  - monitor  5,6. Hypertensive retinopathy OU  - discussed importance of tight BP control  - monitor  7. Pseudophakia OU  - s/p CE/IOL OU  - IOLs in good position, doing well  - monitor  8. Diplopia  - pt with long-standing history of diplopia  - has prism in glasses  - pt reports recent improvement in diplopia over the last several weeks -- ?affected by peripapillary CNV   Ophthalmic Meds Ordered this visit:  Meds ordered this encounter  Medications  . Bevacizumab (AVASTIN) SOLN 1.25 mg       Return for [name], DFE, OCT.  There are no Patient Instructions on file for this visit.   Explained the diagnoses, plan, and follow up with the patient and they expressed understanding.  Patient expressed understanding of the importance of proper follow up care.   This document serves as a record of services personally performed by Karie Chimera, MD, PhD. It was created on their behalf by Glee Arvin. Manson Passey, COT, an ophthalmic technician. The creation of this record is the provider's dictation and/or activities during the visit.    Electronically signed by: Glee Arvin. Manson Passey, COT 06.23.2021 9:00 AM  Karie Chimera, M.D., Ph.D. Diseases & Surgery of the Retina and Vitreous [facility] 08/14/2019   I have reviewed the above documentation for accuracy and completeness, and I agree with the above. Karie Chimera, M.D., Ph.D. 08/14/19 9:00 AM   Abbreviations: M myopia (nearsighted); A astigmatism; H hyperopia (farsighted); P presbyopia; Mrx spectacle prescription;  CTL contact lenses; OD right eye; OS left eye; OU both eyes  XT exotropia; ET esotropia; PEK punctate epithelial keratitis; PEE punctate epithelial erosions; DES dry eye syndrome; MGD meibomian gland dysfunction; ATs artificial tears; PFAT's preservative free artificial tears; NSC nuclear sclerotic cataract; PSC posterior subcapsular cataract; ERM epi-retinal  membrane; PVD posterior vitreous detachment; RD retinal detachment; DM diabetes mellitus; DR diabetic retinopathy; NPDR non-proliferative diabetic retinopathy; PDR proliferative diabetic retinopathy; CSME clinically significant macular edema; DME diabetic macular edema; dbh dot blot hemorrhages; CWS cotton wool spot; POAG primary open angle glaucoma; C/D cup-to-disc ratio; HVF humphrey visual field; GVF goldmann visual field; OCT optical coherence tomography; IOP intraocular pressure; BRVO Branch retinal vein occlusion; CRVO central retinal vein occlusion; CRAO central retinal artery occlusion; BRAO branch retinal artery occlusion; RT retinal tear; SB scleral buckle; PPV pars plana vitrectomy; VH Vitreous hemorrhage; PRP panretinal laser photocoagulation; IVK intravitreal kenalog; VMT vitreomacular traction; MH Macular hole;  NVD neovascularization of the disc; NVE neovascularization elsewhere; AREDS age related eye disease study; ARMD age related macular degeneration; POAG primary open angle glaucoma; EBMD epithelial/anterior basement membrane dystrophy; ACIOL anterior chamber intraocular lens; IOL intraocular lens; PCIOL posterior chamber intraocular lens; Phaco/IOL phacoemulsification with intraocular lens placement; PRK photorefractive keratectomy; LASIK laser assisted in situ keratomileusis; HTN hypertension; DM diabetes mellitus; COPD chronic obstructive pulmonary disease

## 2019-08-14 ENCOUNTER — Other Ambulatory Visit: Payer: Self-pay

## 2019-08-14 ENCOUNTER — Ambulatory Visit (INDEPENDENT_AMBULATORY_CARE_PROVIDER_SITE_OTHER): Payer: Medicare Other | Admitting: Ophthalmology

## 2019-08-14 ENCOUNTER — Encounter (INDEPENDENT_AMBULATORY_CARE_PROVIDER_SITE_OTHER): Payer: Self-pay | Admitting: Ophthalmology

## 2019-08-14 DIAGNOSIS — H3581 Retinal edema: Secondary | ICD-10-CM

## 2019-08-14 DIAGNOSIS — H353211 Exudative age-related macular degeneration, right eye, with active choroidal neovascularization: Secondary | ICD-10-CM

## 2019-08-14 DIAGNOSIS — H43823 Vitreomacular adhesion, bilateral: Secondary | ICD-10-CM

## 2019-08-14 DIAGNOSIS — J449 Chronic obstructive pulmonary disease, unspecified: Secondary | ICD-10-CM | POA: Diagnosis not present

## 2019-08-14 DIAGNOSIS — H353122 Nonexudative age-related macular degeneration, left eye, intermediate dry stage: Secondary | ICD-10-CM | POA: Diagnosis not present

## 2019-08-14 DIAGNOSIS — H35033 Hypertensive retinopathy, bilateral: Secondary | ICD-10-CM

## 2019-08-14 DIAGNOSIS — Z961 Presence of intraocular lens: Secondary | ICD-10-CM

## 2019-08-14 DIAGNOSIS — I10 Essential (primary) hypertension: Secondary | ICD-10-CM | POA: Diagnosis not present

## 2019-08-14 DIAGNOSIS — H532 Diplopia: Secondary | ICD-10-CM

## 2019-08-14 MED ORDER — BEVACIZUMAB CHEMO INJECTION 1.25MG/0.05ML SYRINGE FOR KALEIDOSCOPE
1.2500 mg | INTRAVITREAL | Status: AC | PRN
  Administered 2019-08-14: 1.25 mg via INTRAVITREAL

## 2019-09-05 DIAGNOSIS — I482 Chronic atrial fibrillation, unspecified: Secondary | ICD-10-CM | POA: Diagnosis not present

## 2019-09-05 DIAGNOSIS — I480 Paroxysmal atrial fibrillation: Secondary | ICD-10-CM | POA: Diagnosis not present

## 2019-09-05 DIAGNOSIS — I10 Essential (primary) hypertension: Secondary | ICD-10-CM | POA: Diagnosis not present

## 2019-09-06 DIAGNOSIS — I482 Chronic atrial fibrillation, unspecified: Secondary | ICD-10-CM | POA: Insufficient documentation

## 2019-09-06 HISTORY — DX: Chronic atrial fibrillation, unspecified: I48.20

## 2019-09-10 NOTE — Progress Notes (Signed)
[facility] - Clinic Note  09/12/2019     CHIEF COMPLAINT Patient presents for Retina Follow Up   HISTORY OF PRESENT ILLNESS: Eddie Kelly is a 86 y.o. male who presents to the clinic today for:   HPI    Retina Follow Up    Patient presents with  Wet AMD.  In right eye.  This started months ago.  Severity is moderate.  Duration of 4 weeks.  Since onset it is stable.  I, the attending physician,  performed the HPI with the patient and updated documentation appropriately.          Comments    86 y/o male pt here for 4 wk f/u for [name].  No change in [place] OU.  Denies pain, FOL, floaters.  Systane prn OU.       Last edited by Rennis ChrisZamora, Tylor Courtwright, MD on 09/12/2019  4:43 PM. (History)    pt states his vision seems the same, his son states he is undergoing a procedure in September that will hopefully get him off blood thinners  Referring physician: Lucianne LeiUppin, Nina, MD [address] [place] [id number]  HISTORICAL INFORMATION:   Selected notes from the MEDICAL RECORD NUMBER Referred by Dr. Zetta Bills. Groat for concern of CNVM OD LEE: 05/04/2019 BCVA: 20/70-1            20/50+1   CURRENT MEDICATIONS: No current outpatient medications on file. (Ophthalmic Drugs)   No current facility-administered medications for this visit. (Ophthalmic Drugs)   Current Outpatient Medications (Other)  Medication Sig  . aspirin EC 81 MG tablet Take 81 mg by mouth daily.  . Cholecalciferol (VITAMIN D PO) Take 5,000 Units by mouth continuous dialysis.   Marland Kitchen. clopidogrel (PLAVIX) 75 MG tablet TAKE 1 TABLET(75 MG) BY MOUTH DAILY  . cyanocobalamin (,VITAMIN B-12,) 1000 MCG/ML injection Inject 1,000 mcg into the muscle every 30 (thirty) days.  . diclofenac Sodium (VOLTAREN) 1 % GEL SMARTSIG:4 Gram(s) Topical 3 Times Daily PRN  . fluticasone (KLS ALLER-FLO) 50 MCG/ACT nasal spray Place 1 spray into both nostrils daily.  . furosemide (LASIX) 20 MG tablet Take 20 mg by mouth daily.   . Lactobacillus (LACTINEX) PACK Take by mouth.  . levothyroxine (SYNTHROID, LEVOTHROID) 88 MCG tablet Take 88 mcg by mouth daily.  Marland Kitchen. loratadine (CLARITIN) 10 MG tablet Take 10 mg by mouth daily.  Marland Kitchen. losartan (COZAAR) 100 MG tablet Take 100 mg by mouth daily.  . metoprolol tartrate (LOPRESSOR) 25 MG tablet Take 25 mg by mouth 2 (two) times daily.  . Misc Natural Products (PROSTATE HEALTH) CAPS Take 1 capsule by mouth daily.  . montelukast (SINGULAIR) 10 MG tablet Take 10 mg by mouth at bedtime.  . Multiple Vitamin (MULTIVITAMIN WITH MINERALS) TABS tablet Take 1 tablet by mouth daily.  . nitroGLYCERIN (NITROSTAT) 0.4 MG SL tablet Place 0.4 mg under the tongue every 5 (five) minutes as needed.  . silodosin (RAPAFLO) 4 MG CAPS capsule Take by mouth.  . SPS 15 GM/60ML suspension Take 15 g by mouth once.  . tamsulosin (FLOMAX) 0.4 MG CAPS capsule Take 1 capsule by mouth daily.   No current facility-administered medications for this visit. (Other)      REVIEW OF SYSTEMS: ROS    Positive for: Cardiovascular, Eyes   Negative for: Constitutional, Gastrointestinal, Neurological, Skin, Genitourinary, Musculoskeletal, HENT, Endocrine, Respiratory, Psychiatric, Allergic/Imm, Heme/Lymph   Last edited by Celine MansBaxley, Andrew G, COA on 09/12/2019  8:45 AM. (  History)       ALLERGIES No Known Allergies  PAST MEDICAL HISTORY Past Medical History:  Diagnosis Date  . A-fib (HCC)   . Anxiety and depression   . CAD (coronary artery disease)   . Hypertensive retinopathy    OU  . Hypothyroidism   . Macular degeneration    Wet OD; Dry OS  . PVC (premature ventricular contraction)   . Sinus bradycardia   . Vitamin D deficiency    Past Surgical History:  Procedure Laterality Date  . CATARACT EXTRACTION Bilateral   . EYE SURGERY Bilateral    Cat Sx  . HERNIA REPAIR      FAMILY HISTORY Family History  Problem Relation Age of Onset  . Cancer Father   . Heart attack Father   . Heart disease  Brother   . Diabetes Brother     SOCIAL HISTORY Social History   Tobacco Use  . Smoking status: Former Smoker    Quit date: 1983    Years since quitting: 38.5  . Smokeless tobacco: Never Used  Vaping Use  . Vaping Use: Never used  Substance Use Topics  . Alcohol use: Yes    Alcohol/week: 1.0 standard drink    Types: 1 Cans of beer per week    Comment: "every once in a blue moon"  . Drug use: No         OPHTHALMIC EXAM:  Base Eye Exam    Visual Acuity (Snellen - Linear)      Right Left   Dist cc 20/80 -2 20/40   Dist ph cc 20/70 -2 20/30 -2   Correction: Glasses       Tonometry (Tonopen, 8:48 AM)      Right Left   Pressure 9 8       Pupils      Dark Light Shape React APD   Right 4 4 Round Minimal None   Left 2 2 Round Minimal None       Visual Fields (Counting fingers)      Left Right    Full Full       Extraocular Movement      Right Left    Full, Ortho Full, Ortho       Neuro/Psych    Oriented x3: Yes   Mood/Affect: Normal       Dilation    Both eyes: 1.0% Mydriacyl, 2.5% Phenylephrine @ 8:48 AM        Slit Lamp and Fundus Exam    Slit Lamp Exam      Right Left   Lids/Lashes Dermatochalasis - upper lid Dermatochalasis - upper lid   Conjunctiva/Sclera White and quiet White and quiet   Cornea 2+ Punctate epithelial erosions, crocodile shegreen, well healed temporal cataract wounds, dry tear film 2+ Punctate epithelial erosions, crocodile shegreen, well healed temporal cataract wounds, dry tear film   Anterior Chamber Deep and quiet Deep and quiet   Iris Round and moderately dilated Round and poorly dilated to 4.18mm   Lens PC IOL in good position with open PC PC IOL in good position with open PC   Vitreous Vitreous syneresis Vitreous syneresis       Fundus Exam      Right Left   Disc mild Pallor, Sharp rim, peripapillary CNV with +heme/exudate ST disc -- improving mild Pallor, Sharp rim   C/D Ratio 0.4 0.4   Macula Flat, Blunted foveal  reflex, +peripapillary heme and  exudate -- improving, edema - persistent  Flat, Blunted foveal reflex, fine Drusen, mild Retinal pigment epithelial mottling   Vessels Vascular attenuation, mild Tortuousity, perivascular exudation along superior venule Vascular attenuation, mild Tortuousity   Periphery Attached, mild RPE changes, No heme  Attached, no heme, mild RPE changes            IMAGING AND PROCEDURES  Imaging and Procedures for @TODAY @  OCT, Retina - OU - Both Eyes       Right Eye Quality was good. Central Foveal Thickness: 267. Progression has been stable. Findings include abnormal foveal contour, vitreous traction, epiretinal membrane, intraretinal fluid, intraretinal hyper-reflective material, subretinal fluid, subretinal hyper-reflective material, outer retinal atrophy, pigment epithelial detachment, retinal drusen  (Persistent peripapillary SRF/CNV; persistent VMT with central cystic changes).   Left Eye Quality was good. Central Foveal Thickness: 37. Progression has been stable. Findings include vitreous traction, abnormal foveal contour, no SRF, subretinal hyper-reflective material, retinal drusen , intraretinal fluid (Stable VMT with central SRHM and cystic changes).   Notes *Images captured and stored on drive  Diagnosis / Impression:  OD: peripapillary CNVM with +SRF/IRF; VMT -- Persistent peripapillary SRF/CNV; persistent VMT with central cystic changes OS: non-exu ARMD; +VMT -- Stable VMT with central SRHM and cystic changes  Clinical management:  See below  Abbreviations: NFP - Normal foveal profile. CME - cystoid macular edema. PED - pigment epithelial detachment. IRF - intraretinal fluid. SRF - subretinal fluid. EZ - ellipsoid zone. ERM - epiretinal membrane. ORA - outer retinal atrophy. ORT - outer retinal tubulation. SRHM - subretinal hyper-reflective material        Intravitreal Injection, Pharmacologic Agent - OD - Right Eye       Time Out 09/12/2019.  9:35 AM. Confirmed correct patient, procedure, site, and patient consented.   Anesthesia Topical anesthesia was used. Anesthetic medications included Lidocaine 2%, Proparacaine 0.5%.   Procedure Preparation included 5% betadine to ocular surface, eyelid speculum. A (32g) needle was used.   Injection:  2 mg aflibercept 09/14/2019) SOLN   NDC: Gretta Cool, Lot: [id number], Expiration date: 11/22/2019   Route: Intravitreal, Site: Right Eye, Waste: 0.05 mL  Post-op Post injection exam found visual acuity of at least counting fingers. The patient tolerated the procedure well. There were no complications. The patient received written and verbal post procedure care education.   Notes **SAMPLE MEDICATION ADMINISTERED**                ASSESSMENT/PLAN:    ICD-10-CM   1. Exudative age-related macular degeneration of right eye with active choroidal neovascularization (HCC)  H35.3211 Intravitreal Injection, Pharmacologic Agent - OD - Right Eye    aflibercept (EYLEA) SOLN 2 mg  2. Retinal edema  H35.81 OCT, Retina - OU - Both Eyes  3. Intermediate stage nonexudative age-related macular degeneration of left eye  H35.3122   4. Vitreomacular adhesion of both eyes  H43.823   5. Essential hypertension  I10   6. Hypertensive retinopathy of both eyes  H35.033   7. Pseudophakia of both eyes  Z96.1     1,2. Exudative age related macular degeneration, right eye   - incidental discovery of peripapillary CNV w/ heme and SRF OD during emergent visit for corneal abrasion OS             - S/P IVA OD #1 (03.22.21), #2 (04.19.21), #3 (05.26.21), #4 (06.23.21)  - OCT shows persistent SRF and IRHM -- no significant improvement; persistent VMT with central cystic changes   - discussed possible resistance to IVA and switching medication  -  BCVA stable at 20/70   - recommend IVE OD #1 today, 07.22.21 -- sample  - pt wishes to be treated with IVE sample  - RBA of procedure discussed, questions answered  -  IVE informed consent obtained, signed and scanned, 7.22.21  - Avastin informed consent obtained, signed and scanned, 03.22.21  - see procedure note  - Eylea4U benefits investigation started 7.22.21  - f/u in 4-5 wks -- DFE/OCT, possible injection  3. Age related macular degeneration, non-exudative, left eye  - The incidence, anatomy, and pathology of dry AMD, risk of progression, and the AREDS and AREDS 2 study including smoking risks discussed with patient.  - Recommend amsler grid monitoring  4. VMT OU  - mild  - monitor  5,6. Hypertensive retinopathy OU  - discussed importance of tight BP control  - monitor  7. Pseudophakia OU  - s/p CE/IOL OU  - IOLs in good position, doing well  - monitor  Ophthalmic Meds Ordered this visit:  Meds ordered this encounter  Medications  . aflibercept (EYLEA) SOLN 2 mg       Return in about 4 weeks (around 10/10/2019) for f/u [name], DFE, OCT.  There are no Patient Instructions on file for this visit.   Explained the diagnoses, plan, and follow up with the patient and they expressed understanding.  Patient expressed understanding of the importance of proper follow up care.   This document serves as a record of services personally performed by Karie Chimera, MD, PhD. It was created on their behalf by Joni Reining, an ophthalmic technician. The creation of this record is the provider's dictation and/or activities during the visit.    Electronically signed by: Joni Reining COA, 09/12/19  5:00 PM   This document serves as a record of services personally performed by Karie Chimera, MD, PhD. It was created on their behalf by Glee Arvin. Manson Passey, OA an ophthalmic technician. The creation of this record is the provider's dictation and/or activities during the visit.    Electronically signed by: Glee Arvin. Manson Passey, [place] 07.22.2021 5:00 PM   Karie Chimera, M.D., Ph.D. Diseases & Surgery of the Retina and Vitreous [facility]  I have reviewed the above documentation for accuracy and completeness, and I agree with the above. Karie Chimera, M.D., Ph.D. 09/12/19 5:02 PM    Abbreviations: M myopia (nearsighted); A astigmatism; H hyperopia (farsighted); P presbyopia; Mrx spectacle prescription;  CTL contact lenses; OD right eye; OS left eye; OU both eyes  XT exotropia; ET esotropia; PEK punctate epithelial keratitis; PEE punctate epithelial erosions; DES dry eye syndrome; MGD meibomian gland dysfunction; ATs artificial tears; PFAT's preservative free artificial tears; NSC nuclear sclerotic cataract; PSC posterior subcapsular cataract; ERM epi-retinal membrane; PVD posterior vitreous detachment; RD retinal detachment; DM diabetes mellitus; DR diabetic retinopathy; NPDR non-proliferative diabetic retinopathy; PDR proliferative diabetic retinopathy; CSME clinically significant macular edema; DME diabetic macular edema; dbh dot blot hemorrhages; CWS cotton wool spot; POAG primary open angle glaucoma; C/D cup-to-disc ratio; HVF humphrey visual field; GVF goldmann visual field; OCT optical coherence tomography; IOP intraocular pressure; BRVO Branch retinal vein occlusion; CRVO central retinal vein occlusion; CRAO central retinal artery occlusion; BRAO branch retinal artery occlusion; RT retinal tear; SB scleral buckle; PPV pars plana vitrectomy; VH Vitreous hemorrhage; PRP panretinal laser photocoagulation; IVK intravitreal kenalog; VMT vitreomacular traction; MH Macular hole;  NVD neovascularization of the disc; NVE neovascularization elsewhere; AREDS age related eye disease study; ARMD age related  macular degeneration; POAG primary open angle glaucoma; EBMD epithelial/anterior basement membrane dystrophy; ACIOL anterior chamber intraocular lens; IOL intraocular lens; PCIOL posterior chamber intraocular lens; Phaco/IOL phacoemulsification with intraocular lens placement; PRK photorefractive keratectomy; LASIK laser assisted in situ  keratomileusis; HTN hypertension; DM diabetes mellitus; COPD chronic obstructive pulmonary disease

## 2019-09-11 DIAGNOSIS — I10 Essential (primary) hypertension: Secondary | ICD-10-CM

## 2019-09-11 HISTORY — DX: Essential (primary) hypertension: I10

## 2019-09-12 ENCOUNTER — Encounter (INDEPENDENT_AMBULATORY_CARE_PROVIDER_SITE_OTHER): Payer: Self-pay | Admitting: Ophthalmology

## 2019-09-12 ENCOUNTER — Ambulatory Visit (INDEPENDENT_AMBULATORY_CARE_PROVIDER_SITE_OTHER): Payer: Medicare Other | Admitting: Ophthalmology

## 2019-09-12 ENCOUNTER — Other Ambulatory Visit: Payer: Self-pay

## 2019-09-12 DIAGNOSIS — H3581 Retinal edema: Secondary | ICD-10-CM | POA: Diagnosis not present

## 2019-09-12 DIAGNOSIS — H43823 Vitreomacular adhesion, bilateral: Secondary | ICD-10-CM | POA: Diagnosis not present

## 2019-09-12 DIAGNOSIS — I10 Essential (primary) hypertension: Secondary | ICD-10-CM | POA: Diagnosis not present

## 2019-09-12 DIAGNOSIS — H35033 Hypertensive retinopathy, bilateral: Secondary | ICD-10-CM

## 2019-09-12 DIAGNOSIS — Z961 Presence of intraocular lens: Secondary | ICD-10-CM

## 2019-09-12 DIAGNOSIS — H353122 Nonexudative age-related macular degeneration, left eye, intermediate dry stage: Secondary | ICD-10-CM | POA: Diagnosis not present

## 2019-09-12 DIAGNOSIS — H353211 Exudative age-related macular degeneration, right eye, with active choroidal neovascularization: Secondary | ICD-10-CM

## 2019-09-12 MED ORDER — AFLIBERCEPT 2MG/0.05ML IZ SOLN FOR KALEIDOSCOPE
2.0000 mg | INTRAVITREAL | Status: AC | PRN
  Administered 2019-09-12: 2 mg via INTRAVITREAL

## 2019-09-13 DIAGNOSIS — J449 Chronic obstructive pulmonary disease, unspecified: Secondary | ICD-10-CM | POA: Diagnosis not present

## 2019-09-16 DIAGNOSIS — R339 Retention of urine, unspecified: Secondary | ICD-10-CM | POA: Diagnosis not present

## 2019-09-18 DIAGNOSIS — I482 Chronic atrial fibrillation, unspecified: Secondary | ICD-10-CM | POA: Diagnosis not present

## 2019-09-18 DIAGNOSIS — J9811 Atelectasis: Secondary | ICD-10-CM | POA: Diagnosis not present

## 2019-09-18 DIAGNOSIS — J90 Pleural effusion, not elsewhere classified: Secondary | ICD-10-CM | POA: Diagnosis not present

## 2019-09-30 DIAGNOSIS — Z8679 Personal history of other diseases of the circulatory system: Secondary | ICD-10-CM | POA: Diagnosis not present

## 2019-09-30 DIAGNOSIS — J309 Allergic rhinitis, unspecified: Secondary | ICD-10-CM | POA: Diagnosis not present

## 2019-09-30 DIAGNOSIS — N289 Disorder of kidney and ureter, unspecified: Secondary | ICD-10-CM | POA: Diagnosis not present

## 2019-09-30 DIAGNOSIS — G629 Polyneuropathy, unspecified: Secondary | ICD-10-CM | POA: Diagnosis not present

## 2019-09-30 DIAGNOSIS — I10 Essential (primary) hypertension: Secondary | ICD-10-CM | POA: Diagnosis not present

## 2019-10-14 DIAGNOSIS — J449 Chronic obstructive pulmonary disease, unspecified: Secondary | ICD-10-CM | POA: Diagnosis not present

## 2019-10-15 NOTE — Progress Notes (Signed)
[facility] - Clinic Note  10/16/2019     CHIEF COMPLAINT Patient presents for Retina Follow Up   HISTORY OF PRESENT ILLNESS: Eddie Kelly is a 86 y.o. male who presents to the clinic today for:   HPI    Retina Follow Up    Patient presents with  Wet AMD.  In right eye.  This started 4 weeks ago.  Severity is moderate.  I, the attending physician,  performed the HPI with the patient and updated documentation appropriately.          Comments    Patient here for 4 weeks retina follow up for [name]. Patient states seeing good OD but gradually losing vision. OS good. Has double vision. No eye pain.        Last edited by Rennis ChrisZamora, Dewain Platz, MD on 10/16/2019 12:02 PM. (History)    pt states no problems after first Eylea injection, he thinks his vision may have dropped "just a little bit"   Referring physician: Sallye LatGroat, Christopher, MD [address] [place] [id number]  HISTORICAL INFORMATION:   Selected notes from the MEDICAL RECORD NUMBER Referred by Dr. Zetta Bills. Groat for concern of CNVM OD LEE: 05/04/2019 BCVA: 20/70-1            20/50+1   CURRENT MEDICATIONS: No current outpatient medications on file. (Ophthalmic Drugs)   No current facility-administered medications for this visit. (Ophthalmic Drugs)   Current Outpatient Medications (Other)  Medication Sig  . aspirin EC 81 MG tablet Take 81 mg by mouth daily.  . Cholecalciferol (VITAMIN D PO) Take 5,000 Units by mouth continuous dialysis.   Marland Kitchen. clopidogrel (PLAVIX) 75 MG tablet TAKE 1 TABLET(75 MG) BY MOUTH DAILY  . cyanocobalamin (,VITAMIN B-12,) 1000 MCG/ML injection Inject 1,000 mcg into the muscle every 30 (thirty) days.  . diclofenac Sodium (VOLTAREN) 1 % GEL SMARTSIG:4 Gram(s) Topical 3 Times Daily PRN  . fluticasone (KLS ALLER-FLO) 50 MCG/ACT nasal spray Place 1 spray into both nostrils daily.  . furosemide (LASIX) 20 MG tablet Take 20 mg by mouth daily.  . Lactobacillus (LACTINEX)  PACK Take by mouth.  . levothyroxine (SYNTHROID, LEVOTHROID) 88 MCG tablet Take 88 mcg by mouth daily.  Marland Kitchen. loratadine (CLARITIN) 10 MG tablet Take 10 mg by mouth daily.  Marland Kitchen. losartan (COZAAR) 100 MG tablet Take 100 mg by mouth daily.  . metoprolol tartrate (LOPRESSOR) 25 MG tablet Take 25 mg by mouth 2 (two) times daily.  . Misc Natural Products (PROSTATE HEALTH) CAPS Take 1 capsule by mouth daily.  . montelukast (SINGULAIR) 10 MG tablet Take 10 mg by mouth at bedtime.  . Multiple Vitamin (MULTIVITAMIN WITH MINERALS) TABS tablet Take 1 tablet by mouth daily.  . nitroGLYCERIN (NITROSTAT) 0.4 MG SL tablet Place 0.4 mg under the tongue every 5 (five) minutes as needed.  . silodosin (RAPAFLO) 4 MG CAPS capsule Take by mouth.  . SPS 15 GM/60ML suspension Take 15 g by mouth once.  . tamsulosin (FLOMAX) 0.4 MG CAPS capsule Take 1 capsule by mouth daily.   No current facility-administered medications for this visit. (Other)      REVIEW OF SYSTEMS: ROS    Positive for: Gastrointestinal, Neurological, Cardiovascular, Eyes   Negative for: Constitutional, Skin, Genitourinary, Musculoskeletal, HENT, Endocrine, Respiratory, Psychiatric, Allergic/Imm, Heme/Lymph   Last edited by Laddie Aquaslarke, Rebecca S, COA on 10/16/2019  9:43 AM. (History)       ALLERGIES No Known Allergies  PAST MEDICAL  HISTORY Past Medical History:  Diagnosis Date  . A-fib (HCC)   . Anxiety and depression   . CAD (coronary artery disease)   . Hypertensive retinopathy    OU  . Hypothyroidism   . Macular degeneration    Wet OD; Dry OS  . PVC (premature ventricular contraction)   . Sinus bradycardia   . Vitamin D deficiency    Past Surgical History:  Procedure Laterality Date  . CATARACT EXTRACTION Bilateral   . EYE SURGERY Bilateral    Cat Sx  . HERNIA REPAIR      FAMILY HISTORY Family History  Problem Relation Age of Onset  . Cancer Father   . Heart attack Father   . Heart disease Brother   . Diabetes Brother      SOCIAL HISTORY Social History   Tobacco Use  . Smoking status: Former Smoker    Quit date: 1983    Years since quitting: 38.6  . Smokeless tobacco: Never Used  Vaping Use  . Vaping Use: Never used  Substance Use Topics  . Alcohol use: Yes    Alcohol/week: 1.0 standard drink    Types: 1 Cans of beer per week    Comment: "every once in a blue moon"  . Drug use: No         OPHTHALMIC EXAM:  Base Eye Exam    Visual Acuity (Snellen - Linear)      Right Left   Dist cc 20/80 -2 20/25 -2   Dist ph cc 20/70 -2 NI   Correction: Glasses       Tonometry (Tonopen, 9:40 AM)      Right Left   Pressure 11 10       Pupils      Dark Light Shape React APD   Right 4 4 Round Minimal None   Left 2 2 Round Minimal None       Visual Fields (Counting fingers)      Left Right    Full Full       Extraocular Movement      Right Left    Full, Ortho Full, Ortho       Neuro/Psych    Oriented x3: Yes   Mood/Affect: Normal       Dilation    Both eyes: 1.0% Mydriacyl, 2.5% Phenylephrine @ 9:40 AM        Slit Lamp and Fundus Exam    Slit Lamp Exam      Right Left   Lids/Lashes Dermatochalasis - upper lid Dermatochalasis - upper lid   Conjunctiva/Sclera White and quiet White and quiet   Cornea 2+ Punctate epithelial erosions, crocodile shegreen, well healed temporal cataract wounds, dry tear film 2+ Punctate epithelial erosions, crocodile shegreen, well healed temporal cataract wounds, dry tear film   Anterior Chamber Deep and quiet Deep and quiet   Iris Round and moderately dilated Round and poorly dilated to 4.77mm   Lens PC IOL in good position with open PC PC IOL in good position with open PC   Vitreous Vitreous syneresis Vitreous syneresis       Fundus Exam      Right Left   Disc mild Pallor, Sharp rim, peripapillary CNV with +heme/exudate ST disc -- improving mild Pallor, Sharp rim   C/D Ratio 0.4 0.4   Macula Flat, Blunted foveal reflex, +peripapillary heme and   exudate -- improving, edema - improved Flat, Blunted foveal reflex, fine Drusen, mild Retinal pigment epithelial mottling   Vessels  Vascular attenuation, mild Tortuousity, perivascular exudation along superior venule Vascular attenuation, mild Tortuousity   Periphery Attached, mild RPE changes, No heme  Attached, no heme, mild RPE changes          Refraction    Wearing Rx      Sphere Cylinder Axis Add   Right -3.00 +3.00 005 +2.75   Left -1.50 +3.00 167 +2.75          IMAGING AND PROCEDURES  Imaging and Procedures for @TODAY @  OCT, Retina - OU - Both Eyes       Right Eye Quality was good. Central Foveal Thickness: 262. Progression has improved. Findings include abnormal foveal contour, vitreous traction, epiretinal membrane, intraretinal fluid, intraretinal hyper-reflective material, subretinal fluid, subretinal hyper-reflective material, outer retinal atrophy, pigment epithelial detachment, retinal drusen  (Interval improvement in peripapillary IRF/SRF/IRHM; persistent VMT with central cystic changes).   Left Eye Quality was good. Central Foveal Thickness: 301. Progression has been stable. Findings include vitreous traction, abnormal foveal contour, no SRF, subretinal hyper-reflective material, retinal drusen , intraretinal fluid (Stable VMT with central SRHM (vitelliform lesion) and cystic changes).   Notes *Images captured and stored on drive  Diagnosis / Impression:  OD: peripapillary CNVM -- Interval improvement in peripapillary IRF/SRF/IRHM; persistent VMT with central cystic changes OS: non-exu ARMD; +VMT -- Stable VMT with central SRHM (vitelliform lesion) and cystic changes  Clinical management:  See below  Abbreviations: NFP - Normal foveal profile. CME - cystoid macular edema. PED - pigment epithelial detachment. IRF - intraretinal fluid. SRF - subretinal fluid. EZ - ellipsoid zone. ERM - epiretinal membrane. ORA - outer retinal atrophy. ORT - outer retinal  tubulation. SRHM - subretinal hyper-reflective material        Intravitreal Injection, Pharmacologic Agent - OD - Right Eye       Time Out 10/16/2019. 10:49 AM. Confirmed correct patient, procedure, site, and patient consented.   Anesthesia Topical anesthesia was used. Anesthetic medications included Lidocaine 2%, Proparacaine 0.5%.   Procedure Preparation included 5% betadine to ocular surface, eyelid speculum. A (32g) needle was used.   Injection:  2 mg aflibercept 10/18/2019) SOLN   NDC: Gretta Cool, Lot: [id number], Expiration date: 11/22/2019   Route: Intravitreal, Site: Right Eye, Waste: 0.05 mL  Post-op Post injection exam found visual acuity of at least counting fingers. The patient tolerated the procedure well. There were no complications. The patient received written and verbal post procedure care education.                 ASSESSMENT/PLAN:    ICD-10-CM   1. Exudative age-related macular degeneration of right eye with active choroidal neovascularization (HCC)  H35.3211 Intravitreal Injection, Pharmacologic Agent - OD - Right Eye    aflibercept (EYLEA) SOLN 2 mg  2. Retinal edema  H35.81 OCT, Retina - OU - Both Eyes  3. Intermediate stage nonexudative age-related macular degeneration of left eye  H35.3122   4. Vitreomacular adhesion of both eyes  H43.823   5. Essential hypertension  I10   6. Hypertensive retinopathy of both eyes  H35.033   7. Pseudophakia of both eyes  Z96.1     1,2. Exudative age related macular degeneration, right eye   - incidental discovery of peripapillary CNV w/ heme and SRF OD during emergent visit for corneal abrasion OS             - S/P IVA OD #1 (03.22.21), #2 (04.19.21), #3 (05.26.21), #4 (06.23.21) -- IVA resistance  - S/P IVE OD #1  sample (07.22.21)  - OCT shows good initial response to IVE -- interval improvement in peripapillary IRF/SRF/IRHM  - BCVA stable at 20/70   - recommend IVE OD #2 today, 08.24.21   - pt wishes to be  treated with IVE  - RBA of procedure discussed, questions answered  - IVE informed consent obtained, signed and scanned, 07.22.21  - Avastin informed consent obtained, signed and scanned, 03.22.21  - see procedure note  - Eylea4U benefits investigation started 07.22.21 -- approved for 2021  - f/u in 4 wks -- DFE/OCT, possible injection  3. Age related macular degeneration, non-exudative, left eye  - The incidence, anatomy, and pathology of dry AMD, risk of progression, and the AREDS and AREDS 2 study including smoking risks discussed with patient.  - recommend amsler grid montoring  4. VMT OU  - mild  - monitor  5,6. Hypertensive retinopathy OU  - discussed importance of tight BP control  - monitor  7. Pseudophakia OU  - s/p CE/IOL OU  - IOLs in good position, doing well  - monitor  Ophthalmic Meds Ordered this visit:  Meds ordered this encounter  Medications  . aflibercept (EYLEA) SOLN 2 mg       Return in about 4 weeks (around 11/13/2019) for f/u [name], DFE, OCT.  There are no Patient Instructions on file for this visit.   Explained the diagnoses, plan, and follow up with the patient and they expressed understanding.  Patient expressed understanding of the importance of proper follow up care.   This document serves as a record of services personally performed by Karie Chimera, MD, PhD. It was created on their behalf by Annalee Genta, COMT. The creation of this record is the provider's dictation and/or activities during the visit.  Electronically signed by: Annalee Genta, COMT 10/16/19 12:07 PM   This document serves as a record of services personally performed by Karie Chimera, MD, PhD. It was created on their behalf by Glee Arvin. Manson Passey, OA an ophthalmic technician. The creation of this record is the provider's dictation and/or activities during the visit.    Electronically signed by: Glee Arvin. Manson Passey, [place] 08.25.2021 12:07 PM   Karie Chimera, M.D.,  Ph.D. Diseases & Surgery of the Retina and Vitreous [facility]  I have reviewed the above documentation for accuracy and completeness, and I agree with the above. Karie Chimera, M.D., Ph.D. 10/16/19 12:09 PM   Abbreviations: M myopia (nearsighted); A astigmatism; H hyperopia (farsighted); P presbyopia; Mrx spectacle prescription;  CTL contact lenses; OD right eye; OS left eye; OU both eyes  XT exotropia; ET esotropia; PEK punctate epithelial keratitis; PEE punctate epithelial erosions; DES dry eye syndrome; MGD meibomian gland dysfunction; ATs artificial tears; PFAT's preservative free artificial tears; NSC nuclear sclerotic cataract; PSC posterior subcapsular cataract; ERM epi-retinal membrane; PVD posterior vitreous detachment; RD retinal detachment; DM diabetes mellitus; DR diabetic retinopathy; NPDR non-proliferative diabetic retinopathy; PDR proliferative diabetic retinopathy; CSME clinically significant macular edema; DME diabetic macular edema; dbh dot blot hemorrhages; CWS cotton wool spot; POAG primary open angle glaucoma; C/D cup-to-disc ratio; HVF humphrey visual field; GVF goldmann visual field; OCT optical coherence tomography; IOP intraocular pressure; BRVO Branch retinal vein occlusion; CRVO central retinal vein occlusion; CRAO central retinal artery occlusion; BRAO branch retinal artery occlusion; RT retinal tear; SB scleral buckle; PPV pars plana vitrectomy; VH Vitreous hemorrhage; PRP panretinal laser photocoagulation; IVK intravitreal kenalog; VMT vitreomacular traction; MH Macular hole;  NVD neovascularization  of the disc; NVE neovascularization elsewhere; AREDS age related eye disease study; ARMD age related macular degeneration; POAG primary open angle glaucoma; EBMD epithelial/anterior basement membrane dystrophy; ACIOL anterior chamber intraocular lens; IOL intraocular lens; PCIOL posterior chamber intraocular lens; Phaco/IOL phacoemulsification with  intraocular lens placement; PRK photorefractive keratectomy; LASIK laser assisted in situ keratomileusis; HTN hypertension; DM diabetes mellitus; COPD chronic obstructive pulmonary disease

## 2019-10-16 ENCOUNTER — Encounter (INDEPENDENT_AMBULATORY_CARE_PROVIDER_SITE_OTHER): Payer: Self-pay | Admitting: Ophthalmology

## 2019-10-16 ENCOUNTER — Other Ambulatory Visit: Payer: Self-pay

## 2019-10-16 ENCOUNTER — Ambulatory Visit (INDEPENDENT_AMBULATORY_CARE_PROVIDER_SITE_OTHER): Payer: Medicare Other | Admitting: Ophthalmology

## 2019-10-16 DIAGNOSIS — H353211 Exudative age-related macular degeneration, right eye, with active choroidal neovascularization: Secondary | ICD-10-CM

## 2019-10-16 DIAGNOSIS — I10 Essential (primary) hypertension: Secondary | ICD-10-CM | POA: Diagnosis not present

## 2019-10-16 DIAGNOSIS — H43823 Vitreomacular adhesion, bilateral: Secondary | ICD-10-CM | POA: Diagnosis not present

## 2019-10-16 DIAGNOSIS — H3581 Retinal edema: Secondary | ICD-10-CM

## 2019-10-16 DIAGNOSIS — H353122 Nonexudative age-related macular degeneration, left eye, intermediate dry stage: Secondary | ICD-10-CM

## 2019-10-16 DIAGNOSIS — H35033 Hypertensive retinopathy, bilateral: Secondary | ICD-10-CM

## 2019-10-16 DIAGNOSIS — Z961 Presence of intraocular lens: Secondary | ICD-10-CM

## 2019-10-16 MED ORDER — AFLIBERCEPT 2MG/0.05ML IZ SOLN FOR KALEIDOSCOPE
2.0000 mg | INTRAVITREAL | Status: AC | PRN
  Administered 2019-10-16: 2 mg via INTRAVITREAL

## 2019-11-06 DIAGNOSIS — I2510 Atherosclerotic heart disease of native coronary artery without angina pectoris: Secondary | ICD-10-CM | POA: Diagnosis not present

## 2019-11-06 DIAGNOSIS — I482 Chronic atrial fibrillation, unspecified: Secondary | ICD-10-CM | POA: Diagnosis not present

## 2019-11-06 DIAGNOSIS — Z86010 Personal history of colonic polyps: Secondary | ICD-10-CM | POA: Diagnosis not present

## 2019-11-06 DIAGNOSIS — Z7982 Long term (current) use of aspirin: Secondary | ICD-10-CM | POA: Diagnosis not present

## 2019-11-06 DIAGNOSIS — J449 Chronic obstructive pulmonary disease, unspecified: Secondary | ICD-10-CM | POA: Diagnosis not present

## 2019-11-06 DIAGNOSIS — I10 Essential (primary) hypertension: Secondary | ICD-10-CM | POA: Diagnosis not present

## 2019-11-06 DIAGNOSIS — Z006 Encounter for examination for normal comparison and control in clinical research program: Secondary | ICD-10-CM | POA: Diagnosis not present

## 2019-11-06 DIAGNOSIS — S3739XA Other injury of urethra, initial encounter: Secondary | ICD-10-CM | POA: Diagnosis not present

## 2019-11-06 DIAGNOSIS — Z95818 Presence of other cardiac implants and grafts: Secondary | ICD-10-CM | POA: Diagnosis not present

## 2019-11-06 DIAGNOSIS — I4821 Permanent atrial fibrillation: Secondary | ICD-10-CM | POA: Diagnosis not present

## 2019-11-06 DIAGNOSIS — Z7901 Long term (current) use of anticoagulants: Secondary | ICD-10-CM | POA: Diagnosis not present

## 2019-11-06 DIAGNOSIS — E039 Hypothyroidism, unspecified: Secondary | ICD-10-CM | POA: Diagnosis not present

## 2019-11-06 DIAGNOSIS — Z87891 Personal history of nicotine dependence: Secondary | ICD-10-CM | POA: Diagnosis not present

## 2019-11-06 DIAGNOSIS — I4891 Unspecified atrial fibrillation: Secondary | ICD-10-CM | POA: Diagnosis not present

## 2019-11-07 DIAGNOSIS — Z95818 Presence of other cardiac implants and grafts: Secondary | ICD-10-CM | POA: Diagnosis not present

## 2019-11-08 DIAGNOSIS — R310 Gross hematuria: Secondary | ICD-10-CM | POA: Diagnosis not present

## 2019-11-08 DIAGNOSIS — R339 Retention of urine, unspecified: Secondary | ICD-10-CM | POA: Diagnosis not present

## 2019-11-13 ENCOUNTER — Encounter (INDEPENDENT_AMBULATORY_CARE_PROVIDER_SITE_OTHER): Payer: Medicare Other | Admitting: Ophthalmology

## 2019-11-13 DIAGNOSIS — R310 Gross hematuria: Secondary | ICD-10-CM | POA: Diagnosis not present

## 2019-11-14 DIAGNOSIS — J449 Chronic obstructive pulmonary disease, unspecified: Secondary | ICD-10-CM | POA: Diagnosis not present

## 2019-11-14 DIAGNOSIS — R339 Retention of urine, unspecified: Secondary | ICD-10-CM | POA: Diagnosis not present

## 2019-11-15 ENCOUNTER — Encounter (INDEPENDENT_AMBULATORY_CARE_PROVIDER_SITE_OTHER): Payer: Medicare Other | Admitting: Ophthalmology

## 2019-11-15 DIAGNOSIS — N390 Urinary tract infection, site not specified: Secondary | ICD-10-CM | POA: Diagnosis not present

## 2019-12-03 ENCOUNTER — Encounter (INDEPENDENT_AMBULATORY_CARE_PROVIDER_SITE_OTHER): Payer: Medicare Other | Admitting: Ophthalmology

## 2019-12-03 NOTE — Progress Notes (Signed)
[facility] - Clinic Note  12/04/2019     CHIEF COMPLAINT Patient presents for Retina Follow Up   HISTORY OF PRESENT ILLNESS: Eddie Kelly is a 86 y.o. male who presents to the clinic today for:   HPI    Retina Follow Up    Patient presents with  Wet AMD.  In right eye.  This started weeks ago.  Severity is moderate.  Duration of weeks.  Since onset it is stable.  I, the attending physician,  performed the HPI with the patient and updated documentation appropriately.          Comments    Pt states vision is the same OU.  Patient denies eye pain or discomfort and denies any new or worsening floaters or fol OU.       Last edited by [name], MD on 12/04/2019 12:17 PM. (History)    delayed f/u from 4 to 7 wks   Referring physician: [name], MD [address] [place],  [place] [address]  HISTORICAL INFORMATION:   Selected notes from the MEDICAL RECORD NUMBER Referred by Dr. Zetta Bills for concern of CNVM OD LEE: 05/04/2019 BCVA: 20/70-1            20/50+1   CURRENT MEDICATIONS: No current outpatient medications on file. (Ophthalmic Drugs)   No current facility-administered medications for this visit. (Ophthalmic Drugs)   Current Outpatient Medications (Other)  Medication Sig  . aspirin EC 81 MG tablet Take 81 mg by mouth daily.  . Cholecalciferol (VITAMIN D PO) Take 5,000 Units by mouth continuous dialysis.   Marland Kitchen clopidogrel (PLAVIX) 75 MG tablet TAKE 1 TABLET(75 MG) BY MOUTH DAILY  . cyanocobalamin (,VITAMIN B-12,) 1000 MCG/ML injection Inject 1,000 mcg into the muscle every 30 (thirty) days.  . diclofenac Sodium (VOLTAREN) 1 % GEL SMARTSIG:4 Gram(s) Topical 3 Times Daily PRN  . fluticasone (KLS ALLER-FLO) 50 MCG/ACT nasal spray Place 1 spray into both nostrils daily.  . furosemide (LASIX) 20 MG tablet Take 20 mg by mouth daily.  . Lactobacillus (LACTINEX) PACK Take by mouth.  . levothyroxine (SYNTHROID, LEVOTHROID) 88 MCG tablet  Take 88 mcg by mouth daily.  Marland Kitchen loratadine (CLARITIN) 10 MG tablet Take 10 mg by mouth daily.  Marland Kitchen losartan (COZAAR) 100 MG tablet Take 100 mg by mouth daily.  . metoprolol tartrate (LOPRESSOR) 25 MG tablet Take 25 mg by mouth 2 (two) times daily.  . Misc Natural Products (PROSTATE HEALTH) CAPS Take 1 capsule by mouth daily.  . montelukast (SINGULAIR) 10 MG tablet Take 10 mg by mouth at bedtime.  . Multiple Vitamin (MULTIVITAMIN WITH MINERALS) TABS tablet Take 1 tablet by mouth daily.  . nitroGLYCERIN (NITROSTAT) 0.4 MG SL tablet Place 0.4 mg under the tongue every 5 (five) minutes as needed.  . silodosin (RAPAFLO) 4 MG CAPS capsule Take by mouth.  . SPS 15 GM/60ML suspension Take 15 g by mouth once.  . tamsulosin (FLOMAX) 0.4 MG CAPS capsule Take 1 capsule by mouth daily.   No current facility-administered medications for this visit. (Other)      REVIEW OF SYSTEMS: ROS    Positive for: Gastrointestinal, Neurological, Cardiovascular, Eyes   Negative for: Constitutional, Skin, Genitourinary, Musculoskeletal, HENT, Endocrine, Respiratory, Psychiatric, Allergic/Imm, Heme/Lymph   Last edited by Corrinne Eagle on 12/04/2019 10:33 AM. (History)       ALLERGIES No Known Allergies  PAST MEDICAL HISTORY Past Medical History:  Diagnosis Date  . A-fib (HCC)   .  Anxiety and depression   . CAD (coronary artery disease)   . Hypertensive retinopathy    OU  . Hypothyroidism   . Macular degeneration    Wet OD; Dry OS  . PVC (premature ventricular contraction)   . Sinus bradycardia   . Vitamin D deficiency    Past Surgical History:  Procedure Laterality Date  . CATARACT EXTRACTION Bilateral   . EYE SURGERY Bilateral    Cat Sx  . HERNIA REPAIR      FAMILY HISTORY Family History  Problem Relation Age of Onset  . Cancer Father   . Heart attack Father   . Heart disease Brother   . Diabetes Brother     SOCIAL HISTORY Social History   Tobacco Use  . Smoking status: Former  Smoker    Quit date: 1983    Years since quitting: 38.8  . Smokeless tobacco: Never Used  Vaping Use  . Vaping Use: Never used  Substance Use Topics  . Alcohol use: Yes    Alcohol/week: 1.0 standard drink    Types: 1 Cans of beer per week    Comment: "every once in a blue moon"  . Drug use: No         OPHTHALMIC EXAM:  Base Eye Exam    Visual Acuity (Snellen - Linear)      Right Left   Dist cc 20/100 +1 20/50 -2   Dist ph cc NI NI   Correction: Glasses       Tonometry (Tonopen, 10:44 AM)      Right Left   Pressure 12 10       Pupils      Dark Light Shape React APD   Right 5 4 Round Minimal 0   Left 2 1 Round Minimal 0       Visual Fields      Left Right    Full Full       Extraocular Movement      Right Left    Full Full       Neuro/Psych    Oriented x3: Yes   Mood/Affect: Normal       Dilation    Both eyes: 1.0% Mydriacyl, 2.5% Phenylephrine @ 10:44 AM        Slit Lamp and Fundus Exam    Slit Lamp Exam      Right Left   Lids/Lashes Dermatochalasis - upper lid Dermatochalasis - upper lid   Conjunctiva/Sclera White and quiet White and quiet   Cornea 2+ Punctate epithelial erosions, crocodile shegreen, well healed temporal cataract wounds, dry tear film 2+ Punctate epithelial erosions, crocodile shegreen, well healed temporal cataract wounds, dry tear film   Anterior Chamber Deep and quiet Deep and quiet   Iris Round and moderately dilated Round and poorly dilated to 4.24mm   Lens PC IOL in good position with open PC PC IOL in good position with open PC   Vitreous Vitreous syneresis Vitreous syneresis       Fundus Exam      Right Left   Disc mild Pallor, Sharp rim, peripapillary CNV with +exudate ST disc -- improving mild Pallor, Sharp rim   C/D Ratio 0.4 0.4   Macula Flat, Blunted foveal reflex, +peripapillary heme and  exudate -- improving, edema - improved Flat, Blunted foveal reflex, fine Drusen, mild Retinal pigment epithelial mottling    Vessels Vascular attenuation, mild Tortuousity, perivascular exudation along superior venule -- persistent Vascular attenuation, mild Tortuousity   Periphery Attached,  mild RPE changes, No heme  Attached, no heme, mild RPE changes          Refraction    Wearing Rx      Sphere Cylinder Axis Add   Right -3.00 +3.00 005 +2.75   Left -1.50 +3.00 167 +2.75       Manifest Refraction      Sphere Cylinder Axis Dist VA   Right -2.75 +3.00 005 20/100+1   Left -0.75 +3.00 165 20/30-2          IMAGING AND PROCEDURES  Imaging and Procedures for @TODAY @  OCT, Retina - OU - Both Eyes       Right Eye Quality was good. Central Foveal Thickness: 261. Progression has improved. Findings include abnormal foveal contour, vitreous traction, epiretinal membrane, intraretinal fluid, intraretinal hyper-reflective material, subretinal fluid, subretinal hyper-reflective material, outer retinal atrophy, pigment epithelial detachment, retinal drusen  (Interval improvement in peripapillary IRF/SRF/IRHM; persistent peripapillary PED, persistent VMT with central cystic changes).   Left Eye Quality was good. Central Foveal Thickness: 308. Progression has been stable. Findings include vitreous traction, abnormal foveal contour, no SRF, subretinal hyper-reflective material, retinal drusen , intraretinal fluid (Stable VMT with central SRHM (vitelliform lesion) and cystic changes).   Notes *Images captured and stored on drive  Diagnosis / Impression:  OD: peripapillary CNVM -- Interval improvement in peripapillary IRF/SRF/IRHM; persistent peripapillary PED, persistent VMT with central cystic changes OS: non-exu ARMD; +VMT -- Stable VMT with central SRHM (vitelliform lesion) and cystic changes  Clinical management:  See below  Abbreviations: NFP - Normal foveal profile. CME - cystoid macular edema. PED - pigment epithelial detachment. IRF - intraretinal fluid. SRF - subretinal fluid. EZ - ellipsoid zone. ERM -  epiretinal membrane. ORA - outer retinal atrophy. ORT - outer retinal tubulation. SRHM - subretinal hyper-reflective material        Intravitreal Injection, Pharmacologic Agent - OD - Right Eye       Time Out 12/04/2019. 11:27 AM. Confirmed correct patient, procedure, site, and patient consented.   Anesthesia Topical anesthesia was used. Anesthetic medications included Lidocaine 2%, Proparacaine 0.5%.   Procedure Preparation included 5% betadine to ocular surface, eyelid speculum. A (32g) needle was used.   Injection:  2 mg aflibercept 12/06/2019) SOLN   NDC: Gretta Cool, Lot: [id number], Expiration date: 02/22/2020   Route: Intravitreal, Site: Right Eye, Waste: 0.05 mL  Post-op Post injection exam found visual acuity of at least counting fingers. The patient tolerated the procedure well. There were no complications. The patient received written and verbal post procedure care education. Post injection medications were not given.                 ASSESSMENT/PLAN:    ICD-10-CM   1. Exudative age-related macular degeneration of right eye with active choroidal neovascularization (HCC)  H35.3211 Intravitreal Injection, Pharmacologic Agent - OD - Right Eye    aflibercept (EYLEA) SOLN 2 mg  2. Retinal edema  H35.81 OCT, Retina - OU - Both Eyes  3. Intermediate stage nonexudative age-related macular degeneration of left eye  H35.3122   4. Vitreomacular adhesion of both eyes  H43.823   5. Essential hypertension  I10   6. Hypertensive retinopathy of both eyes  H35.033   7. Pseudophakia of both eyes  Z96.1     1,2. Exudative age related macular degeneration, right eye   - delayed f/u from 4 to 7 wks  - incidental discovery of peripapillary CNV w/ heme and SRF OD during emergent visit for  corneal abrasion OS             - S/P IVA OD #1 (03.22.21), #2 (04.19.21), #3 (05.26.21), #4 (06.23.21) -- IVA resistance  - S/P IVE OD #1 sample (07.22.21), #2 (08.24.21)  - OCT shows good initial  response to IVE -- interval improvement in peripapillary IRF/SRF/IRHM  - BCVA down to 20/100 from 20/70  - recommend IVE OD #3 today, 10.13.21  - pt wishes to be treated with IVE  - RBA of procedure discussed, questions answered  - IVE informed consent obtained, signed and scanned, 07.22.21  - Avastin informed consent obtained, signed and scanned, 03.22.21  - see procedure note  - Eylea4U benefits investigation started 07.22.21 -- approved for 2021  - f/u in 6 wks -- DFE/OCT, possible injection  3. Age related macular degeneration, non-exudative, left eye  - The incidence, anatomy, and pathology of dry AMD, risk of progression, and the AREDS and AREDS 2 study including smoking risks discussed with patient.  - recommend amsler grid monitoring  4. VMT OU  - mild  - monitor  5,6. Hypertensive retinopathy OU  - discussed importance of tight BP control  - monitor  7. Pseudophakia OU  - s/p CE/IOL OU  - IOLs in good position, doing well  - monitor  Ophthalmic Meds Ordered this visit:  Meds ordered this encounter  Medications  . aflibercept (EYLEA) SOLN 2 mg       Return in about 6 weeks (around 01/15/2020) for f/u [name], DFE, OCT.  There are no Patient Instructions on file for this visit.   Explained the diagnoses, plan, and follow up with the patient and they expressed understanding.  Patient expressed understanding of the importance of proper follow up care.   This document serves as a record of services personally performed by Karie Chimera, MD, PhD. It was created on their behalf by Annalee Genta, COMT. The creation of this record is the provider's dictation and/or activities during the visit.  Electronically signed by: Annalee Genta, COMT 12/04/19 12:21 PM  This document serves as a record of services personally performed by Karie Chimera, MD, PhD. It was created on their behalf by Cristopher Estimable, COT an ophthalmic technician. The creation of this record is the  provider's dictation and/or activities during the visit.    Electronically signed by: Cristopher Estimable, [place] 10.13.21 @ 12:21 PM  Karie Chimera, M.D., Ph.D. Diseases & Surgery of the Retina and Vitreous [facility] 10.13.21  I have reviewed the above documentation for accuracy and completeness, and I agree with the above. Karie Chimera, M.D., Ph.D. 12/04/19 12:21 PM   Abbreviations: M myopia (nearsighted); A astigmatism; H hyperopia (farsighted); P presbyopia; Mrx spectacle prescription;  CTL contact lenses; OD right eye; OS left eye; OU both eyes  XT exotropia; ET esotropia; PEK punctate epithelial keratitis; PEE punctate epithelial erosions; DES dry eye syndrome; MGD meibomian gland dysfunction; ATs artificial tears; PFAT's preservative free artificial tears; NSC nuclear sclerotic cataract; PSC posterior subcapsular cataract; ERM epi-retinal membrane; PVD posterior vitreous detachment; RD retinal detachment; DM diabetes mellitus; DR diabetic retinopathy; NPDR non-proliferative diabetic retinopathy; PDR proliferative diabetic retinopathy; CSME clinically significant macular edema; DME diabetic macular edema; dbh dot blot hemorrhages; CWS cotton wool spot; POAG primary open angle glaucoma; C/D cup-to-disc ratio; HVF humphrey visual field; GVF goldmann visual field; OCT optical coherence tomography; IOP intraocular pressure; BRVO Branch retinal vein occlusion; CRVO central retinal vein occlusion; CRAO central retinal  artery occlusion; BRAO branch retinal artery occlusion; RT retinal tear; SB scleral buckle; PPV pars plana vitrectomy; VH Vitreous hemorrhage; PRP panretinal laser photocoagulation; IVK intravitreal kenalog; VMT vitreomacular traction; MH Macular hole;  NVD neovascularization of the disc; NVE neovascularization elsewhere; AREDS age related eye disease study; ARMD age related macular degeneration; POAG primary open angle glaucoma; EBMD epithelial/anterior basement  membrane dystrophy; ACIOL anterior chamber intraocular lens; IOL intraocular lens; PCIOL posterior chamber intraocular lens; Phaco/IOL phacoemulsification with intraocular lens placement; PRK photorefractive keratectomy; LASIK laser assisted in situ keratomileusis; HTN hypertension; DM diabetes mellitus; COPD chronic obstructive pulmonary disease

## 2019-12-04 ENCOUNTER — Encounter (INDEPENDENT_AMBULATORY_CARE_PROVIDER_SITE_OTHER): Payer: Self-pay | Admitting: Ophthalmology

## 2019-12-04 ENCOUNTER — Ambulatory Visit (INDEPENDENT_AMBULATORY_CARE_PROVIDER_SITE_OTHER): Payer: Medicare Other | Admitting: Ophthalmology

## 2019-12-04 ENCOUNTER — Other Ambulatory Visit: Payer: Self-pay

## 2019-12-04 VITALS — BP 178/64 | Temp 96.8°F

## 2019-12-04 DIAGNOSIS — H43823 Vitreomacular adhesion, bilateral: Secondary | ICD-10-CM | POA: Diagnosis not present

## 2019-12-04 DIAGNOSIS — H353122 Nonexudative age-related macular degeneration, left eye, intermediate dry stage: Secondary | ICD-10-CM

## 2019-12-04 DIAGNOSIS — H3581 Retinal edema: Secondary | ICD-10-CM

## 2019-12-04 DIAGNOSIS — N390 Urinary tract infection, site not specified: Secondary | ICD-10-CM | POA: Diagnosis not present

## 2019-12-04 DIAGNOSIS — H353211 Exudative age-related macular degeneration, right eye, with active choroidal neovascularization: Secondary | ICD-10-CM | POA: Diagnosis not present

## 2019-12-04 DIAGNOSIS — Z961 Presence of intraocular lens: Secondary | ICD-10-CM

## 2019-12-04 DIAGNOSIS — I10 Essential (primary) hypertension: Secondary | ICD-10-CM | POA: Diagnosis not present

## 2019-12-04 DIAGNOSIS — H35033 Hypertensive retinopathy, bilateral: Secondary | ICD-10-CM

## 2019-12-04 MED ORDER — AFLIBERCEPT 2MG/0.05ML IZ SOLN FOR KALEIDOSCOPE
2.0000 mg | INTRAVITREAL | Status: AC | PRN
  Administered 2019-12-04: 2 mg via INTRAVITREAL

## 2019-12-09 DIAGNOSIS — R040 Epistaxis: Secondary | ICD-10-CM | POA: Diagnosis not present

## 2019-12-09 DIAGNOSIS — I700 Atherosclerosis of aorta: Secondary | ICD-10-CM | POA: Diagnosis not present

## 2019-12-12 DIAGNOSIS — Z7982 Long term (current) use of aspirin: Secondary | ICD-10-CM | POA: Diagnosis not present

## 2019-12-12 DIAGNOSIS — I10 Essential (primary) hypertension: Secondary | ICD-10-CM | POA: Diagnosis not present

## 2019-12-12 DIAGNOSIS — E039 Hypothyroidism, unspecified: Secondary | ICD-10-CM | POA: Diagnosis not present

## 2019-12-12 DIAGNOSIS — I493 Ventricular premature depolarization: Secondary | ICD-10-CM | POA: Diagnosis not present

## 2019-12-12 DIAGNOSIS — R040 Epistaxis: Secondary | ICD-10-CM | POA: Diagnosis not present

## 2019-12-12 DIAGNOSIS — I491 Atrial premature depolarization: Secondary | ICD-10-CM | POA: Diagnosis not present

## 2019-12-12 DIAGNOSIS — J449 Chronic obstructive pulmonary disease, unspecified: Secondary | ICD-10-CM | POA: Diagnosis not present

## 2019-12-12 DIAGNOSIS — Z86010 Personal history of colonic polyps: Secondary | ICD-10-CM | POA: Diagnosis not present

## 2019-12-12 DIAGNOSIS — I2510 Atherosclerotic heart disease of native coronary artery without angina pectoris: Secondary | ICD-10-CM | POA: Diagnosis not present

## 2019-12-12 DIAGNOSIS — I482 Chronic atrial fibrillation, unspecified: Secondary | ICD-10-CM | POA: Diagnosis not present

## 2019-12-12 DIAGNOSIS — Z87891 Personal history of nicotine dependence: Secondary | ICD-10-CM | POA: Diagnosis not present

## 2019-12-14 DIAGNOSIS — J449 Chronic obstructive pulmonary disease, unspecified: Secondary | ICD-10-CM | POA: Diagnosis not present

## 2020-01-03 ENCOUNTER — Encounter: Payer: Self-pay | Admitting: Internal Medicine

## 2020-01-08 NOTE — Progress Notes (Signed)
[facility] - Clinic Note  01/09/2020     CHIEF COMPLAINT Patient presents for Retina Follow Up   HISTORY OF PRESENT ILLNESS: Eddie Kelly is a 86 y.o. male who presents to the clinic today for:   HPI    Retina Follow Up    Patient presents with  Wet AMD.  In right eye.  Severity is moderate.  Duration of 5 weeks.  Since onset it is stable.  I, the attending physician,  performed the HPI with the patient and updated documentation appropriately.          Comments    Pt states he cannot tell any difference in his vision, he states he is unable to read the newspaper first thing in the morning, but later in the day his vision is better and he can read it, he feels like he doesn't have good eyesight "anytime"       Last edited by [name], MD on 01/09/2020  9:39 AM. (History)       Referring physician: [name], MD [address] [place],  [place] [address]  HISTORICAL INFORMATION:   Selected notes from the MEDICAL RECORD NUMBER Referred by Dr. Zetta Bills for concern of CNVM OD LEE: 05/04/2019 BCVA: 20/70-1            20/50+1   CURRENT MEDICATIONS: No current outpatient medications on file. (Ophthalmic Drugs)   No current facility-administered medications for this visit. (Ophthalmic Drugs)   Current Outpatient Medications (Other)  Medication Sig  . aspirin EC 81 MG tablet Take 81 mg by mouth daily.  . Cholecalciferol (VITAMIN D PO) Take 5,000 Units by mouth continuous dialysis.   Marland Kitchen clopidogrel (PLAVIX) 75 MG tablet TAKE 1 TABLET(75 MG) BY MOUTH DAILY  . cyanocobalamin (,VITAMIN B-12,) 1000 MCG/ML injection Inject 1,000 mcg into the muscle every 30 (thirty) days.  . diclofenac Sodium (VOLTAREN) 1 % GEL SMARTSIG:4 Gram(s) Topical 3 Times Daily PRN  . fluticasone (KLS ALLER-FLO) 50 MCG/ACT nasal spray Place 1 spray into both nostrils daily.  . furosemide (LASIX) 20 MG tablet Take 20 mg by mouth daily.  . Lactobacillus (LACTINEX) PACK  Take by mouth.  . levothyroxine (SYNTHROID) 100 MCG tablet Take 100 mcg by mouth daily.  Marland Kitchen levothyroxine (SYNTHROID, LEVOTHROID) 88 MCG tablet Take 88 mcg by mouth daily.  Marland Kitchen loratadine (CLARITIN) 10 MG tablet Take 10 mg by mouth daily.  Marland Kitchen losartan (COZAAR) 100 MG tablet Take 100 mg by mouth daily.  . metoprolol tartrate (LOPRESSOR) 25 MG tablet Take 25 mg by mouth 2 (two) times daily.  . Misc Natural Products (PROSTATE HEALTH) CAPS Take 1 capsule by mouth daily.  . montelukast (SINGULAIR) 10 MG tablet Take 10 mg by mouth at bedtime.  . Multiple Vitamin (MULTIVITAMIN WITH MINERALS) TABS tablet Take 1 tablet by mouth daily.  . nitroGLYCERIN (NITROSTAT) 0.4 MG SL tablet Place 0.4 mg under the tongue every 5 (five) minutes as needed.  . silodosin (RAPAFLO) 4 MG CAPS capsule Take by mouth.  . SPS 15 GM/60ML suspension Take 15 g by mouth once.  . sulfamethoxazole-trimethoprim (BACTRIM DS) 800-160 MG tablet Take 1 tablet by mouth 3 (three) times daily.  . tamsulosin (FLOMAX) 0.4 MG CAPS capsule Take 1 capsule by mouth daily.   No current facility-administered medications for this visit. (Other)      REVIEW OF SYSTEMS: ROS    Positive for: Eyes   Negative for: Constitutional, Gastrointestinal, Neurological, Skin, Genitourinary, Musculoskeletal,  HENT, Endocrine, Cardiovascular, Respiratory, Psychiatric, Allergic/Imm, Heme/Lymph   Last edited by Posey BoyerBrown, Amanda J, COT on 01/09/2020  9:22 AM. (History)       ALLERGIES No Known Allergies  PAST MEDICAL HISTORY Past Medical History:  Diagnosis Date  . A-fib (HCC)   . Anxiety and depression   . CAD (coronary artery disease)   . Hypertensive retinopathy    OU  . Hypothyroidism   . Macular degeneration    Wet OD; Dry OS  . PVC (premature ventricular contraction)   . Sinus bradycardia   . Vitamin D deficiency    Past Surgical History:  Procedure Laterality Date  . CATARACT EXTRACTION Bilateral   . EYE SURGERY Bilateral    Cat Sx  .  HERNIA REPAIR      FAMILY HISTORY Family History  Problem Relation Age of Onset  . Cancer Father   . Heart attack Father   . Heart disease Brother   . Diabetes Brother     SOCIAL HISTORY Social History   Tobacco Use  . Smoking status: Former Smoker    Quit date: 1983    Years since quitting: 38.9  . Smokeless tobacco: Never Used  Vaping Use  . Vaping Use: Never used  Substance Use Topics  . Alcohol use: Yes    Alcohol/week: 1.0 standard drink    Types: 1 Cans of beer per week    Comment: "every once in a blue moon"  . Drug use: No         OPHTHALMIC EXAM:  Base Eye Exam    Visual Acuity (Snellen - Linear)      Right Left   Dist cc 20/100 20/25 -1   Dist ph cc 20/80 -2 NI   Correction: Glasses       Tonometry (Tonopen, 9:30 AM)      Right Left   Pressure 09 10       Pupils      Dark Light Shape React APD   Right 4 4 Round None None   Left 2 1.5 Round Minimal None       Visual Fields (Counting fingers)      Left Right    Full Full       Extraocular Movement      Right Left    Full, Ortho Full, Ortho       Neuro/Psych    Oriented x3: Yes   Mood/Affect: Normal       Dilation    Both eyes: 1.0% Mydriacyl, 2.5% Phenylephrine @ 9:30 AM        Slit Lamp and Fundus Exam    Slit Lamp Exam      Right Left   Lids/Lashes Dermatochalasis - upper lid Dermatochalasis - upper lid   Conjunctiva/Sclera White and quiet White and quiet   Cornea 2+ Punctate epithelial erosions, crocodile shegreen, well healed temporal cataract wounds, dry tear film 2+ Punctate epithelial erosions, crocodile shegreen, well healed temporal cataract wounds, dry tear film   Anterior Chamber Deep and quiet Deep and quiet   Iris Round and moderately dilated Round and poorly dilated to 4.395mm   Lens PC IOL in good position with open PC PC IOL in good position with open PC   Vitreous Vitreous syneresis Vitreous syneresis       Fundus Exam      Right Left   Disc mild Pallor,  Sharp rim, peripapillary CNV with +exudate ST disc -- improving, no heme mild Pallor, Sharp rim  C/D Ratio 0.4 0.4   Macula Flat, Blunted foveal reflex, +peripapillary heme (resolved) and  exudate -- peristent, edema - improved Flat, Blunted foveal reflex, fine Drusen, mild Retinal pigment epithelial mottling   Vessels Vascular attenuation, mild Tortuousity, perivascular exudation along superior venule -- persistent Vascular attenuation, mild Tortuousity   Periphery Attached, mild RPE changes, No heme  Attached, no heme, mild RPE changes            IMAGING AND PROCEDURES  Imaging and Procedures for @TODAY @  OCT, Retina - OU - Both Eyes       Right Eye Quality was good. Central Foveal Thickness: 363. Progression has improved. Findings include abnormal foveal contour, vitreous traction, epiretinal membrane, intraretinal fluid, intraretinal hyper-reflective material, subretinal hyper-reflective material, outer retinal atrophy, pigment epithelial detachment, retinal drusen , no SRF (Persistent central VMT; interval improvement in peripapillary CNV and IRF).   Left Eye Quality was good. Central Foveal Thickness: 314. Progression has been stable. Findings include vitreous traction, abnormal foveal contour, no SRF, subretinal hyper-reflective material, retinal drusen , intraretinal fluid (Stable VMT with central SRHM (vitelliform lesion) and cystic changes).   Notes *Images captured and stored on drive  Diagnosis / Impression:  OD: exudative ARMD w/ peripapillary CNVM -- interval improvement in peripapillary CNV and IRF; Persistent central VMT OS: non-exu ARMD; +VMT -- Stable VMT with central SRHM (vitelliform lesion) and cystic changes  Clinical management:  See below  Abbreviations: NFP - Normal foveal profile. CME - cystoid macular edema. PED - pigment epithelial detachment. IRF - intraretinal fluid. SRF - subretinal fluid. EZ - ellipsoid zone. ERM - epiretinal membrane. ORA - outer  retinal atrophy. ORT - outer retinal tubulation. SRHM - subretinal hyper-reflective material        Intravitreal Injection, Pharmacologic Agent - OD - Right Eye       Time Out 01/09/2020. 9:46 AM. Confirmed correct patient, procedure, site, and patient consented.   Anesthesia Topical anesthesia was used. Anesthetic medications included Lidocaine 2%, Proparacaine 0.5%.   Procedure Preparation included 5% betadine to ocular surface, eyelid speculum. A (32g) needle was used.   Injection:  2 mg aflibercept 01/11/2020) SOLN   NDC: Gretta Cool, Lot: [id number], Expiration date: 04/20/2020   Route: Intravitreal, Site: Right Eye, Waste: 0.05 mL  Post-op Post injection exam found visual acuity of at least counting fingers. The patient tolerated the procedure well. There were no complications. The patient received written and verbal post procedure care education. Post injection medications were not given.                 ASSESSMENT/PLAN:    ICD-10-CM   1. Exudative age-related macular degeneration of right eye with active choroidal neovascularization (HCC)  H35.3211 Intravitreal Injection, Pharmacologic Agent - OD - Right Eye    aflibercept (EYLEA) SOLN 2 mg  2. Retinal edema  H35.81 OCT, Retina - OU - Both Eyes  3. Intermediate stage nonexudative age-related macular degeneration of left eye  H35.3122   4. Vitreomacular adhesion of both eyes  H43.823   5. Essential hypertension  I10   6. Hypertensive retinopathy of both eyes  H35.033   7. Pseudophakia of both eyes  Z96.1     1,2. Exudative age related macular degeneration, right eye   - incidental discovery of peripapillary CNV w/ heme and SRF OD during emergent visit for corneal abrasion OS             - S/P IVA OD #1 (03.22.21), #2 (04.19.21), #3 (05.26.21), #4 (06.23.21) --  IVA resistance  - S/P IVE OD #1 sample (07.22.21), #2 (08.24.21), #3 (10.13.21)  - OCT shows good initial response to IVE -- interval improvement in  peripapillary IRF/SRF/IRHM  - BCVA improved to 20/80 from 20/100  - recommend IVE OD #4 today, 11.18.21  - pt wishes to be treated with IVE  - RBA of procedure discussed, questions answered  - IVE informed consent obtained, signed and scanned, 07.22.21  - Avastin informed consent obtained, signed and scanned, 03.22.21  - see procedure note  - Eylea4U benefits investigation started 07.22.21 -- approved for 2021  - f/u in 6 wks -- DFE/OCT, possible injection  3. Age related macular degeneration, non-exudative, left eye  - The incidence, anatomy, and pathology of dry AMD, risk of progression, and the AREDS and AREDS 2 study including smoking risks discussed with patient.  - recommend amsler grid monitoring  4. VMT OU  - mild -- OCT stable from prior  - review of OCTs since initial visit shows progressive traction -- hopefully will release uneventfully soon  - monitor  5,6. Hypertensive retinopathy OU  - discussed importance of tight BP control  - monitor  7. Pseudophakia OU  - s/p CE/IOL OU  - IOLs in good position, doing well  - monitor  Ophthalmic Meds Ordered this visit:  Meds ordered this encounter  Medications  . aflibercept (EYLEA) SOLN 2 mg       Return in about 6 weeks (around 02/20/2020) for f/u [name], DFE, OCT.  There are no Patient Instructions on file for this visit.   Explained the diagnoses, plan, and follow up with the patient and they expressed understanding.  Patient expressed understanding of the importance of proper follow up care.   This document serves as a record of services personally performed by Karie Chimera, MD, PhD. It was created on their behalf by Joni Reining, an ophthalmic technician. The creation of this record is the provider's dictation and/or activities during the visit.    Electronically signed by: Joni Reining COA, 01/09/20  10:23 AM   This document serves as a record of services personally performed by Karie Chimera, MD,  PhD. It was created on their behalf by Glee Arvin. Manson Passey, OA an ophthalmic technician. The creation of this record is the provider's dictation and/or activities during the visit.    Electronically signed by: Glee Arvin. Manson Passey, [place] 11.18.2021 10:23 AM   Karie Chimera, M.D., Ph.D. Diseases & Surgery of the Retina and Vitreous [facility]  I have reviewed the above documentation for accuracy and completeness, and I agree with the above. Karie Chimera, M.D., Ph.D. 01/09/20 10:23 AM   Abbreviations: M myopia (nearsighted); A astigmatism; H hyperopia (farsighted); P presbyopia; Mrx spectacle prescription;  CTL contact lenses; OD right eye; OS left eye; OU both eyes  XT exotropia; ET esotropia; PEK punctate epithelial keratitis; PEE punctate epithelial erosions; DES dry eye syndrome; MGD meibomian gland dysfunction; ATs artificial tears; PFAT's preservative free artificial tears; NSC nuclear sclerotic cataract; PSC posterior subcapsular cataract; ERM epi-retinal membrane; PVD posterior vitreous detachment; RD retinal detachment; DM diabetes mellitus; DR diabetic retinopathy; NPDR non-proliferative diabetic retinopathy; PDR proliferative diabetic retinopathy; CSME clinically significant macular edema; DME diabetic macular edema; dbh dot blot hemorrhages; CWS cotton wool spot; POAG primary open angle glaucoma; C/D cup-to-disc ratio; HVF humphrey visual field; GVF goldmann visual field; OCT optical coherence tomography; IOP intraocular pressure; BRVO Branch retinal vein occlusion; CRVO central retinal vein occlusion;  CRAO central retinal artery occlusion; BRAO branch retinal artery occlusion; RT retinal tear; SB scleral buckle; PPV pars plana vitrectomy; VH Vitreous hemorrhage; PRP panretinal laser photocoagulation; IVK intravitreal kenalog; VMT vitreomacular traction; MH Macular hole;  NVD neovascularization of the disc; NVE neovascularization elsewhere; AREDS age related eye disease study;  ARMD age related macular degeneration; POAG primary open angle glaucoma; EBMD epithelial/anterior basement membrane dystrophy; ACIOL anterior chamber intraocular lens; IOL intraocular lens; PCIOL posterior chamber intraocular lens; Phaco/IOL phacoemulsification with intraocular lens placement; PRK photorefractive keratectomy; LASIK laser assisted in situ keratomileusis; HTN hypertension; DM diabetes mellitus; COPD chronic obstructive pulmonary disease

## 2020-01-09 ENCOUNTER — Other Ambulatory Visit: Payer: Self-pay

## 2020-01-09 ENCOUNTER — Ambulatory Visit (INDEPENDENT_AMBULATORY_CARE_PROVIDER_SITE_OTHER): Payer: Medicare Other | Admitting: Ophthalmology

## 2020-01-09 ENCOUNTER — Encounter (INDEPENDENT_AMBULATORY_CARE_PROVIDER_SITE_OTHER): Payer: Self-pay | Admitting: Ophthalmology

## 2020-01-09 DIAGNOSIS — H35033 Hypertensive retinopathy, bilateral: Secondary | ICD-10-CM

## 2020-01-09 DIAGNOSIS — I10 Essential (primary) hypertension: Secondary | ICD-10-CM

## 2020-01-09 DIAGNOSIS — H353211 Exudative age-related macular degeneration, right eye, with active choroidal neovascularization: Secondary | ICD-10-CM | POA: Diagnosis not present

## 2020-01-09 DIAGNOSIS — H353122 Nonexudative age-related macular degeneration, left eye, intermediate dry stage: Secondary | ICD-10-CM | POA: Diagnosis not present

## 2020-01-09 DIAGNOSIS — H43823 Vitreomacular adhesion, bilateral: Secondary | ICD-10-CM | POA: Diagnosis not present

## 2020-01-09 DIAGNOSIS — H3581 Retinal edema: Secondary | ICD-10-CM

## 2020-01-09 DIAGNOSIS — Z961 Presence of intraocular lens: Secondary | ICD-10-CM

## 2020-01-09 MED ORDER — AFLIBERCEPT 2MG/0.05ML IZ SOLN FOR KALEIDOSCOPE
2.0000 mg | INTRAVITREAL | Status: AC | PRN
  Administered 2020-01-09: 2 mg via INTRAVITREAL

## 2020-01-31 DIAGNOSIS — R001 Bradycardia, unspecified: Secondary | ICD-10-CM | POA: Insufficient documentation

## 2020-01-31 DIAGNOSIS — I493 Ventricular premature depolarization: Secondary | ICD-10-CM | POA: Insufficient documentation

## 2020-01-31 DIAGNOSIS — H3530 Unspecified macular degeneration: Secondary | ICD-10-CM | POA: Insufficient documentation

## 2020-01-31 DIAGNOSIS — H35039 Hypertensive retinopathy, unspecified eye: Secondary | ICD-10-CM | POA: Insufficient documentation

## 2020-01-31 DIAGNOSIS — E039 Hypothyroidism, unspecified: Secondary | ICD-10-CM | POA: Insufficient documentation

## 2020-01-31 DIAGNOSIS — F32A Depression, unspecified: Secondary | ICD-10-CM | POA: Insufficient documentation

## 2020-01-31 DIAGNOSIS — F419 Anxiety disorder, unspecified: Secondary | ICD-10-CM | POA: Insufficient documentation

## 2020-01-31 DIAGNOSIS — I4891 Unspecified atrial fibrillation: Secondary | ICD-10-CM | POA: Insufficient documentation

## 2020-01-31 DIAGNOSIS — E559 Vitamin D deficiency, unspecified: Secondary | ICD-10-CM | POA: Insufficient documentation

## 2020-01-31 DIAGNOSIS — I2510 Atherosclerotic heart disease of native coronary artery without angina pectoris: Secondary | ICD-10-CM | POA: Insufficient documentation

## 2020-02-04 ENCOUNTER — Ambulatory Visit: Payer: Medicare Other | Admitting: Cardiology

## 2020-02-04 ENCOUNTER — Encounter: Payer: Self-pay | Admitting: Cardiology

## 2020-02-04 ENCOUNTER — Other Ambulatory Visit: Payer: Self-pay

## 2020-02-04 VITALS — BP 146/76 | HR 90 | Ht 72.0 in | Wt 186.0 lb

## 2020-02-04 DIAGNOSIS — I4821 Permanent atrial fibrillation: Secondary | ICD-10-CM

## 2020-02-04 DIAGNOSIS — I10 Essential (primary) hypertension: Secondary | ICD-10-CM | POA: Diagnosis not present

## 2020-02-04 DIAGNOSIS — Z95818 Presence of other cardiac implants and grafts: Secondary | ICD-10-CM

## 2020-02-04 DIAGNOSIS — I2510 Atherosclerotic heart disease of native coronary artery without angina pectoris: Secondary | ICD-10-CM | POA: Diagnosis not present

## 2020-02-04 HISTORY — DX: Presence of other cardiac implants and grafts: Z95.818

## 2020-02-04 NOTE — Patient Instructions (Signed)

## 2020-02-04 NOTE — Progress Notes (Signed)
Cardiology Office Note:    Date:  02/04/2020   ID:  Eddie Kelly, DOB 02/09/1926, MRN [id number]  PCP:  [name], MD  Cardiologist:  Garwin Brothers, MD   Referring MD: [name], MD    ASSESSMENT:    1. Permanent atrial fibrillation (HCC)   2. Essential hypertension   3. Coronary artery disease involving native coronary artery of native heart without angina pectoris   4. Presence of Watchman left atrial appendage closure device    PLAN:    In order of problems listed above:  1. Permanent atrial fibrillation: Post watchman device: I discussed findings with the patient at extensive length.  Review of records was done from our interventional colleagues notes and discussed with the patient at length and he vocalized understanding and questions were answered to his satisfaction.  Obviously is not on anticoagulation now and with the watchman device it reduces his risk for thromboembolism. 2. Essential hypertension: Blood pressure stable and diet was emphasized. 3. Mixed dyslipidemia: Diet was emphasized and he prefers to do diet.  He is ambulatory and walks on a regular basis.  He appears younger than stated age. 4. Patient will be seen in follow-up appointment in 6 months or earlier if the patient has any concerns    Medication Adjustments/Labs and Tests Ordered: Current medicines are reviewed at length with the patient today.  Concerns regarding medicines are outlined above.  No orders of the defined types were placed in this encounter.  No orders of the defined types were placed in this encounter.    No chief complaint on file.    History of Present Illness:    Eddie Kelly is a 86 y.o. male.  Patient has past medical history of permanent atrial fibrillation, essential hypertension and dyslipidemia.  He has issues with anticoagulation mostly significant epistaxis.  Therefore we sent him to a specialist at [facility] for [place] evaluation.  Patient will  get the watchman device and is happy about it.  He could not tolerate Xarelto and therefore has been initiated on Plavix and has been told to go off the medication after 4 months of the insertion of the device.  Patient is happy with the procedure.  He is accompanied by his daughter-in-law.  No chest pain orthopnea or PND.  He mentions to me that he has developed urinary tract infection which was treated and he is better.  At the time of my evaluation, the patient is alert awake oriented and in no distress.  Past Medical History:  Diagnosis Date  . A-fib (HCC)   . Anxiety and depression   . Aspiration pneumonia (HCC) 06/27/2019  . CAD (coronary artery disease)   . Chronic atrial fibrillation (HCC) 09/06/2019  . Epistaxis 06/27/2019  . Essential hypertension 04/12/2018  . Hypertension 09/11/2019  . Hypertensive retinopathy    OU  . Hypothyroidism   . Left inguinal hernia 08/10/2017  . Macular degeneration    Wet OD; Dry OS  . Permanent atrial fibrillation (HCC) 01/14/2019  . Postoperative examination 11/29/2017  . Preoperative cardiovascular examination 09/26/2017  . PVC (premature ventricular contraction)   . Sinus bradycardia   . Vitamin D deficiency     Past Surgical History:  Procedure Laterality Date  . CATARACT EXTRACTION Bilateral   . EYE SURGERY Bilateral    Cat Sx  . HERNIA REPAIR      Current Medications: Current Meds  Medication Sig  . aspirin EC 81 MG tablet Take 81  mg by mouth daily.  . Cholecalciferol (VITAMIN D PO) Take 5,000 Units by mouth continuous dialysis.   Marland Kitchen clopidogrel (PLAVIX) 75 MG tablet TAKE 1 TABLET(75 MG) BY MOUTH DAILY  . cyanocobalamin (,VITAMIN B-12,) 1000 MCG/ML injection Inject 1,000 mcg into the muscle every 30 (thirty) days.  . diclofenac Sodium (VOLTAREN) 1 % GEL SMARTSIG:4 Gram(s) Topical 3 Times Daily PRN  . furosemide (LASIX) 20 MG tablet Take 20 mg by mouth daily.  Marland Kitchen levothyroxine (SYNTHROID) 100 MCG tablet Take 100 mcg by mouth daily.  Marland Kitchen  loratadine (CLARITIN) 10 MG tablet Take 10 mg by mouth daily.  Marland Kitchen losartan (COZAAR) 100 MG tablet Take 100 mg by mouth daily.  . metoprolol tartrate (LOPRESSOR) 25 MG tablet Take 25 mg by mouth 2 (two) times daily.  . montelukast (SINGULAIR) 10 MG tablet Take 10 mg by mouth at bedtime.  . Multiple Vitamin (MULTIVITAMIN WITH MINERALS) TABS tablet Take 1 tablet by mouth daily.  . nitrofurantoin, macrocrystal-monohydrate, (MACROBID) 100 MG capsule Take 100 mg by mouth 2 (two) times daily.  . nitroGLYCERIN (NITROSTAT) 0.4 MG SL tablet Place 0.4 mg under the tongue every 5 (five) minutes as needed.  . silodosin (RAPAFLO) 4 MG CAPS capsule Take by mouth.  . tamsulosin (FLOMAX) 0.4 MG CAPS capsule Take 1 capsule by mouth daily.     Allergies:   Ciprofloxacin and Levofloxacin   Social History   Socioeconomic History  . Marital status: Married    Spouse name: Not on file  . Number of children: Not on file  . Years of education: Not on file  . Highest education level: Not on file  Occupational History  . Not on file  Tobacco Use  . Smoking status: Former Smoker    Quit date: 1983    Years since quitting: 38.9  . Smokeless tobacco: Never Used  Vaping Use  . Vaping Use: Never used  Substance and Sexual Activity  . Alcohol use: Yes    Alcohol/week: 1.0 standard drink    Types: 1 Cans of beer per week    Comment: "every once in a blue moon"  . Drug use: No  . Sexual activity: Not on file  Other Topics Concern  . Not on file  Social History Narrative  . Not on file   Social Determinants of Health   Financial Resource Strain: Not on file  Food Insecurity: Not on file  Transportation Needs: Not on file  Physical Activity: Not on file  Stress: Not on file  Social Connections: Not on file     Family History: The patient's family history includes Cancer in his father; Diabetes in his brother; Heart attack in his father; Heart disease in his brother.  ROS:   Please see the history  of present illness.    All other systems reviewed and are negative.  EKGs/Labs/Other Studies Reviewed:    The following studies were reviewed today: I discussed my findings with the patient at length   Recent Labs: No results found for requested labs within last 8760 hours.  Recent Lipid Panel    Component Value Date/Time   CHOL 149 01/14/2019 1055   TRIG 54 01/14/2019 1055   HDL 45 01/14/2019 1055   CHOLHDL 3.3 01/14/2019 1055   LDLCALC 93 01/14/2019 1055    Physical Exam:    VS:  BP (!) 146/76   Pulse 90   Ht 6' (1.829 m)   Wt 186 lb (84.4 kg)   SpO2 99%   BMI  25.23 kg/m     Wt Readings from Last 3 Encounters:  02/04/20 186 lb (84.4 kg)  07/31/19 194 lb (88 kg)  06/27/19 197 lb (89.4 kg)     GEN: Patient is in no acute distress HEENT: Normal NECK: No JVD; No carotid bruits LYMPHATICS: No lymphadenopathy CARDIAC: Hear sounds regular, 2/6 systolic murmur at the apex. RESPIRATORY:  Clear to auscultation without rales, wheezing or rhonchi  ABDOMEN: Soft, non-tender, non-distended MUSCULOSKELETAL:  No edema; No deformity  SKIN: Warm and dry NEUROLOGIC:  Alert and oriented x 3 PSYCHIATRIC:  Normal affect   Signed, Garwin Brothers, MD  02/04/2020 2:41 PM    [facility]

## 2020-02-19 NOTE — Progress Notes (Signed)
[facility] - Clinic Note  02/20/2020     CHIEF COMPLAINT Patient presents for Retina Follow Up   HISTORY OF PRESENT ILLNESS: Eddie Kelly is a 86 y.o. male who presents to the clinic today for:   HPI    Retina Follow Up    Patient presents with  Wet AMD.  In right eye.  Severity is moderate.  Duration of 6 weeks.  Since onset it is stable.  I, the attending physician,  performed the HPI with the patient and updated documentation appropriately.          Comments    6 week Retina follow up for [name]. Patient states vision comes and goes.       Last edited by [name], MD on 02/20/2020  4:30 PM. (History)    pt states his vision comes and goes, which is normal for him    Referring physician: [name], MD [address] [place],  [place] [address]  HISTORICAL INFORMATION:   Selected notes from the MEDICAL RECORD NUMBER Referred by Dr. Zetta Bills for concern of CNVM OD LEE: 05/04/2019 BCVA: 20/70-1            20/50+1   CURRENT MEDICATIONS: No current outpatient medications on file. (Ophthalmic Drugs)   No current facility-administered medications for this visit. (Ophthalmic Drugs)   Current Outpatient Medications (Other)  Medication Sig  . aspirin EC 81 MG tablet Take 81 mg by mouth daily.  . Cholecalciferol (VITAMIN D PO) Take 5,000 Units by mouth continuous dialysis.   Marland Kitchen clopidogrel (PLAVIX) 75 MG tablet TAKE 1 TABLET(75 MG) BY MOUTH DAILY  . cyanocobalamin (,VITAMIN B-12,) 1000 MCG/ML injection Inject 1,000 mcg into the muscle every 30 (thirty) days.  . diclofenac Sodium (VOLTAREN) 1 % GEL SMARTSIG:4 Gram(s) Topical 3 Times Daily PRN  . furosemide (LASIX) 20 MG tablet Take 20 mg by mouth daily.  Marland Kitchen levothyroxine (SYNTHROID) 100 MCG tablet Take 100 mcg by mouth daily.  Marland Kitchen loratadine (CLARITIN) 10 MG tablet Take 10 mg by mouth daily.  Marland Kitchen losartan (COZAAR) 100 MG tablet Take 100 mg by mouth daily.  . metoprolol tartrate  (LOPRESSOR) 25 MG tablet Take 25 mg by mouth 2 (two) times daily.  . montelukast (SINGULAIR) 10 MG tablet Take 10 mg by mouth at bedtime.  . Multiple Vitamin (MULTIVITAMIN WITH MINERALS) TABS tablet Take 1 tablet by mouth daily.  . nitrofurantoin, macrocrystal-monohydrate, (MACROBID) 100 MG capsule Take 100 mg by mouth 2 (two) times daily.  . nitroGLYCERIN (NITROSTAT) 0.4 MG SL tablet Place 0.4 mg under the tongue every 5 (five) minutes as needed.  . silodosin (RAPAFLO) 4 MG CAPS capsule Take by mouth.  . tamsulosin (FLOMAX) 0.4 MG CAPS capsule Take 1 capsule by mouth daily.   No current facility-administered medications for this visit. (Other)      REVIEW OF SYSTEMS: ROS    Positive for: Eyes   Negative for: Constitutional, Gastrointestinal, Neurological, Skin, Genitourinary, Musculoskeletal, HENT, Endocrine, Cardiovascular, Respiratory, Psychiatric, Allergic/Imm, Heme/Lymph   Last edited by Lana Fish, COT on 02/20/2020  8:56 AM. (History)       ALLERGIES Allergies  Allergen Reactions  . Ciprofloxacin Other (See Comments)    Edema, generalized  . Levofloxacin     PAST MEDICAL HISTORY Past Medical History:  Diagnosis Date  . A-fib (HCC)   . Anxiety and depression   . Aspiration pneumonia (HCC) 06/27/2019  . CAD (coronary artery disease)   .  Chronic atrial fibrillation (HCC) 09/06/2019  . Epistaxis 06/27/2019  . Essential hypertension 04/12/2018  . Hypertension 09/11/2019  . Hypertensive retinopathy    OU  . Hypothyroidism   . Left inguinal hernia 08/10/2017  . Macular degeneration    Wet OD; Dry OS  . Permanent atrial fibrillation (HCC) 01/14/2019  . Postoperative examination 11/29/2017  . Preoperative cardiovascular examination 09/26/2017  . PVC (premature ventricular contraction)   . Sinus bradycardia   . Vitamin D deficiency    Past Surgical History:  Procedure Laterality Date  . CATARACT EXTRACTION Bilateral   . EYE SURGERY Bilateral    Cat Sx  . HERNIA REPAIR       FAMILY HISTORY Family History  Problem Relation Age of Onset  . Cancer Father   . Heart attack Father   . Heart disease Brother   . Diabetes Brother     SOCIAL HISTORY Social History   Tobacco Use  . Smoking status: Former Smoker    Quit date: 1983    Years since quitting: 39.0  . Smokeless tobacco: Never Used  Vaping Use  . Vaping Use: Never used  Substance Use Topics  . Alcohol use: Yes    Alcohol/week: 1.0 standard drink    Types: 1 Cans of beer per week    Comment: "every once in a blue moon"  . Drug use: No         OPHTHALMIC EXAM:  Base Eye Exam    Visual Acuity (Snellen - Linear)      Right Left   Dist cc 20/70-1 20/30-2   Dist ph cc 20/NI 20/25-3   Correction: Glasses       Tonometry (Tonopen, 9:05 AM)      Right Left   Pressure 6 6       Pupils      Dark Light Shape React APD   Right 3 2 Round Minimal None   Left 2 1 Round Minimal None       Visual Fields (Counting fingers)      Left Right    Full Full       Extraocular Movement      Right Left    Full, Ortho Full, Ortho       Neuro/Psych    Oriented x3: Yes   Mood/Affect: Normal       Dilation    Both eyes: 1.0% Mydriacyl, 2.5% Phenylephrine @ 9:05 AM        Slit Lamp and Fundus Exam    Slit Lamp Exam      Right Left   Lids/Lashes Dermatochalasis - upper lid Dermatochalasis - upper lid   Conjunctiva/Sclera White and quiet White and quiet   Cornea 2+ Punctate epithelial erosions, crocodile shegreen, well healed temporal cataract wounds, dry tear film 2+ Punctate epithelial erosions, crocodile shegreen, well healed temporal cataract wounds, dry tear film   Anterior Chamber Deep and quiet Deep and quiet   Iris Round and moderately dilated Round and poorly dilated to 4.45mm   Lens PC IOL in good position with open PC PC IOL in good position with open PC   Vitreous Vitreous syneresis Vitreous syneresis       Fundus Exam      Right Left   Disc mild Pallor, Sharp rim,  peripapillary CNV with +exudate ST disc -- improving, no heme mild Pallor, Sharp rim   C/D Ratio 0.4 0.4   Macula Flat, Blunted foveal reflex, +peripapillary heme (resolved) and  exudate -- peristent,  edema - stably improved Flat, Blunted foveal reflex, fine Drusen, mild Retinal pigment epithelial mottling   Vessels Vascular attenuation, mild Tortuousity, perivascular exudation along superior venule -- persistent Vascular attenuation, mild Tortuousity   Periphery Attached, mild RPE changes, No heme  Attached, no heme, mild RPE changes          Refraction    Wearing Rx      Sphere Cylinder Axis Add   Right -3.00 +3.00 005 +2.75   Left -1.50 +3.00 167 +2.75          IMAGING AND PROCEDURES  Imaging and Procedures for @TODAY @  OCT, Retina - OU - Both Eyes       Right Eye Quality was good. Central Foveal Thickness: 366. Progression has been stable. Findings include abnormal foveal contour, vitreous traction, epiretinal membrane, intraretinal fluid, intraretinal hyper-reflective material, subretinal hyper-reflective material, outer retinal atrophy, pigment epithelial detachment, retinal drusen , no SRF (Persistent central VMT; stable improvement in peripapillary CNV and IRF; interval improvement in [facility]).   Left Eye Quality was good. Central Foveal Thickness: 318. Progression has been stable. Findings include vitreous traction, abnormal foveal contour, no SRF, subretinal hyper-reflective material, retinal drusen , intraretinal fluid (Stable VMT with central SRHM (vitelliform lesion) and cystic changes).   Notes *Images captured and stored on drive  Diagnosis / Impression:  OD: exudative ARMD w/ peripapillary CNVM -- stable improvement in peripapillary CNV and IRF; stable central VMT; interval improvement in [facility] OS: non-exu ARMD; +VMT -- Stable VMT with central SRHM (vitelliform lesion) and cystic changes  Clinical management:  See below  Abbreviations: NFP - Normal foveal profile.  CME - cystoid macular edema. PED - pigment epithelial detachment. IRF - intraretinal fluid. SRF - subretinal fluid. EZ - ellipsoid zone. ERM - epiretinal membrane. ORA - outer retinal atrophy. ORT - outer retinal tubulation. SRHM - subretinal hyper-reflective material        Intravitreal Injection, Pharmacologic Agent - OD - Right Eye       Time Out 02/20/2020. 9:12 AM. Confirmed correct patient, procedure, site, and patient consented.   Anesthesia Topical anesthesia was used. Anesthetic medications included Lidocaine 2%, Proparacaine 0.5%.   Procedure Preparation included 5% betadine to ocular surface, eyelid speculum. A (32g) needle was used.   Injection:  2 mg aflibercept 02/22/2020) SOLN   NDC: Gretta Cool, Lot: [id number], Expiration date: 06/20/2020   Route: Intravitreal, Site: Right Eye, Waste: 0.05 mL  Post-op Post injection exam found visual acuity of at least counting fingers. The patient tolerated the procedure well. There were no complications. The patient received written and verbal post procedure care education. Post injection medications were not given.                 ASSESSMENT/PLAN:    ICD-10-CM   1. Exudative age-related macular degeneration of right eye with active choroidal neovascularization (HCC)  H35.3211 Intravitreal Injection, Pharmacologic Agent - OD - Right Eye    aflibercept (EYLEA) SOLN 2 mg  2. Retinal edema  H35.81 OCT, Retina - OU - Both Eyes  3. Intermediate stage nonexudative age-related macular degeneration of left eye  H35.3122   4. Vitreomacular adhesion of both eyes  H43.823   5. Essential hypertension  I10   6. Hypertensive retinopathy of both eyes  H35.033   7. Pseudophakia of both eyes  Z96.1     1,2. Exudative age related macular degeneration, right eye   - incidental discovery of peripapillary CNV w/ heme and SRF OD during emergent visit for  corneal abrasion OS             - S/P IVA OD #1 (03.22.21), #2 (04.19.21), #3  (05.26.21), #4 (06.23.21) -- IVA resistance  - S/P IVE OD #1 sample (07.22.21), #2 (08.24.21), #3 (10.13.21), #4 (11.18.21)  - OCT shows good response to IVE -- stable improvement in peripapillary IRF/SRF/IRHM  - BCVA improved to 20/70 from 20/80  - recommend IVE OD #5 today, 12.30.21 -- maintenance w/ ext to 8 wks  - pt wishes to be treated with IVE  - RBA of procedure discussed, questions answered  - IVE informed consent obtained, signed and scanned, 07.22.21  - Avastin informed consent obtained, signed and scanned, 03.22.21  - see procedure note  - Eylea4U benefits investigation started 07.22.21 -- approved for 2021  - f/u in 8 wks -- DFE/OCT, possible injection, tx and ext as able  3. Age related macular degeneration, non-exudative, left eye  - The incidence, anatomy, and pathology of dry AMD, risk of progression, and the AREDS and AREDS 2 study including smoking risks discussed with patient.  - recommend amsler grid monitoring  4. VMT OU  - mild -- OCT stable from prior  - review of OCTs since initial visit shows progressive traction -- hopefully will release uneventfully soon  - monitor  5,6. Hypertensive retinopathy OU  - discussed importance of tight BP control  - monitor  7. Pseudophakia OU  - s/p CE/IOL OU  - IOLs in good position, doing well  - monitor  Ophthalmic Meds Ordered this visit:  Meds ordered this encounter  Medications  . aflibercept (EYLEA) SOLN 2 mg      Return in about 8 weeks (around 04/16/2020) for f/u [name], DFE, OCT.  There are no Patient Instructions on file for this visit.  Explained the diagnoses, plan, and follow up with the patient and they expressed understanding.  Patient expressed understanding of the importance of proper follow up care.   This document serves as a record of services personally performed by Karie Chimera, MD, PhD. It was created on their behalf by Glee Arvin. Manson Passey, OA an ophthalmic technician. The creation of this  record is the provider's dictation and/or activities during the visit.    Electronically signed by: Glee Arvin. Manson Passey, [place] 12.30.2021 4:30 PM  Karie Chimera, M.D., Ph.D. Diseases & Surgery of the Retina and Vitreous [facility] 02/20/2020   I have reviewed the above documentation for accuracy and completeness, and I agree with the above. Karie Chimera, M.D., Ph.D. 02/20/20 4:31 PM  Abbreviations: M myopia (nearsighted); A astigmatism; H hyperopia (farsighted); P presbyopia; Mrx spectacle prescription;  CTL contact lenses; OD right eye; OS left eye; OU both eyes  XT exotropia; ET esotropia; PEK punctate epithelial keratitis; PEE punctate epithelial erosions; DES dry eye syndrome; MGD meibomian gland dysfunction; ATs artificial tears; PFAT's preservative free artificial tears; NSC nuclear sclerotic cataract; PSC posterior subcapsular cataract; ERM epi-retinal membrane; PVD posterior vitreous detachment; RD retinal detachment; DM diabetes mellitus; DR diabetic retinopathy; NPDR non-proliferative diabetic retinopathy; PDR proliferative diabetic retinopathy; CSME clinically significant macular edema; DME diabetic macular edema; dbh dot blot hemorrhages; CWS cotton wool spot; POAG primary open angle glaucoma; C/D cup-to-disc ratio; HVF humphrey visual field; GVF goldmann visual field; OCT optical coherence tomography; IOP intraocular pressure; BRVO Branch retinal vein occlusion; CRVO central retinal vein occlusion; CRAO central retinal artery occlusion; BRAO branch retinal artery occlusion; RT retinal tear; SB scleral buckle; PPV pars  plana vitrectomy; VH Vitreous hemorrhage; PRP panretinal laser photocoagulation; IVK intravitreal kenalog; VMT vitreomacular traction; MH Macular hole;  NVD neovascularization of the disc; NVE neovascularization elsewhere; AREDS age related eye disease study; ARMD age related macular degeneration; POAG primary open angle glaucoma; EBMD epithelial/anterior  basement membrane dystrophy; ACIOL anterior chamber intraocular lens; IOL intraocular lens; PCIOL posterior chamber intraocular lens; Phaco/IOL phacoemulsification with intraocular lens placement; PRK photorefractive keratectomy; LASIK laser assisted in situ keratomileusis; HTN hypertension; DM diabetes mellitus; COPD chronic obstructive pulmonary disease

## 2020-02-20 ENCOUNTER — Ambulatory Visit (INDEPENDENT_AMBULATORY_CARE_PROVIDER_SITE_OTHER): Payer: Medicare Other | Admitting: Ophthalmology

## 2020-02-20 ENCOUNTER — Encounter (INDEPENDENT_AMBULATORY_CARE_PROVIDER_SITE_OTHER): Payer: Self-pay | Admitting: Ophthalmology

## 2020-02-20 ENCOUNTER — Other Ambulatory Visit: Payer: Self-pay

## 2020-02-20 DIAGNOSIS — H353211 Exudative age-related macular degeneration, right eye, with active choroidal neovascularization: Secondary | ICD-10-CM

## 2020-02-20 DIAGNOSIS — I10 Essential (primary) hypertension: Secondary | ICD-10-CM

## 2020-02-20 DIAGNOSIS — H353122 Nonexudative age-related macular degeneration, left eye, intermediate dry stage: Secondary | ICD-10-CM | POA: Diagnosis not present

## 2020-02-20 DIAGNOSIS — Z961 Presence of intraocular lens: Secondary | ICD-10-CM

## 2020-02-20 DIAGNOSIS — H3581 Retinal edema: Secondary | ICD-10-CM | POA: Diagnosis not present

## 2020-02-20 DIAGNOSIS — H43823 Vitreomacular adhesion, bilateral: Secondary | ICD-10-CM

## 2020-02-20 DIAGNOSIS — H35033 Hypertensive retinopathy, bilateral: Secondary | ICD-10-CM

## 2020-02-20 MED ORDER — AFLIBERCEPT 2MG/0.05ML IZ SOLN FOR KALEIDOSCOPE
2.0000 mg | INTRAVITREAL | Status: AC | PRN
  Administered 2020-02-20: 16:00:00 2 mg via INTRAVITREAL

## 2020-04-14 NOTE — Progress Notes (Signed)
[facility] - Clinic Note  04/16/2020     CHIEF COMPLAINT Patient presents for Retina Follow Up   HISTORY OF PRESENT ILLNESS: Eddie Kelly is a [age] y.o. male who presents to the clinic today for:   HPI    Retina Follow Up    Patient presents with  Wet AMD.  In right eye.  Duration of 8 weeks.  Since onset it is stable.  I, the attending physician,  performed the HPI with the patient and updated documentation appropriately.          Comments    8 week follow up [name]- Patient states vision is about the same.  He has given up on reading because it is so difficult.  He has noticed when parking his car his depth perception is off.  He will not pull up as far as he need to.       Last edited by Rennis ChrisZamora, , MD on 04/16/2020 11:35 AM. (History)    pt states vision seems stable    Referring physician: Lucianne LeiUppin, Nina, MD [address] [place] [id number]  HISTORICAL INFORMATION:   Selected notes from the MEDICAL RECORD NUMBER Referred by Dr. Zetta Bills. Groat for concern of CNVM OD LEE: 05/04/2019 BCVA: 20/70-1            20/50+1   CURRENT MEDICATIONS: No current outpatient medications on file. (Ophthalmic Drugs)   No current facility-administered medications for this visit. (Ophthalmic Drugs)   Current Outpatient Medications (Other)  Medication Sig  . aspirin EC 81 MG tablet Take 81 mg by mouth daily.  . Cholecalciferol (VITAMIN D PO) Take 5,000 Units by mouth continuous dialysis.   Marland Kitchen. cyanocobalamin (,VITAMIN B-12,) 1000 MCG/ML injection Inject 1,000 mcg into the muscle every 30 (thirty) days.  . diclofenac Sodium (VOLTAREN) 1 % GEL SMARTSIG:4 Gram(s) Topical 3 Times Daily PRN  . furosemide (LASIX) 20 MG tablet Take 20 mg by mouth daily.  Marland Kitchen. levothyroxine (SYNTHROID) 100 MCG tablet Take 100 mcg by mouth daily.  Marland Kitchen. loratadine (CLARITIN) 10 MG tablet Take 10 mg by mouth daily.  Marland Kitchen. losartan (COZAAR) 100 MG tablet Take 100 mg by mouth daily.   . metoprolol tartrate (LOPRESSOR) 25 MG tablet Take 25 mg by mouth 2 (two) times daily.  . montelukast (SINGULAIR) 10 MG tablet Take 10 mg by mouth at bedtime.  . Multiple Vitamin (MULTIVITAMIN WITH MINERALS) TABS tablet Take 1 tablet by mouth daily.  . nitrofurantoin, macrocrystal-monohydrate, (MACROBID) 100 MG capsule Take 100 mg by mouth 2 (two) times daily.  . nitroGLYCERIN (NITROSTAT) 0.4 MG SL tablet Place 0.4 mg under the tongue every 5 (five) minutes as needed.  . silodosin (RAPAFLO) 4 MG CAPS capsule Take by mouth.  . tamsulosin (FLOMAX) 0.4 MG CAPS capsule Take 1 capsule by mouth daily.  . clopidogrel (PLAVIX) 75 MG tablet TAKE 1 TABLET(75 MG) BY MOUTH DAILY (Patient not taking: Reported on 04/16/2020)   No current facility-administered medications for this visit. (Other)      REVIEW OF SYSTEMS: ROS    Positive for: Genitourinary, Endocrine, Cardiovascular, Eyes, Psychiatric   Negative for: Constitutional, Gastrointestinal, Neurological, Skin, Musculoskeletal, HENT, Respiratory, Allergic/Imm, Heme/Lymph   Last edited by Joni ReiningHodges, Robin, COA on 04/16/2020  8:56 AM. (History)       ALLERGIES Allergies  Allergen Reactions  . Ciprofloxacin Other (See Comments)    Edema, generalized  . Levofloxacin     PAST MEDICAL HISTORY Past  Medical History:  Diagnosis Date  . A-fib (HCC)   . Anxiety and depression   . Aspiration pneumonia (HCC) 06/27/2019  . CAD (coronary artery disease)   . Chronic atrial fibrillation (HCC) 09/06/2019  . Epistaxis 06/27/2019  . Essential hypertension 04/12/2018  . Hypertension 09/11/2019  . Hypertensive retinopathy    OU  . Hypothyroidism   . Left inguinal hernia 08/10/2017  . Macular degeneration    Wet OD; Dry OS  . Permanent atrial fibrillation (HCC) 01/14/2019  . Postoperative examination 11/29/2017  . Preoperative cardiovascular examination 09/26/2017  . PVC (premature ventricular contraction)   . Sinus bradycardia   . Vitamin D deficiency     Past Surgical History:  Procedure Laterality Date  . CATARACT EXTRACTION Bilateral   . EYE SURGERY Bilateral    Cat Sx  . HERNIA REPAIR      FAMILY HISTORY Family History  Problem Relation Age of Onset  . Cancer Father   . Heart attack Father   . Heart disease Brother   . Diabetes Brother     SOCIAL HISTORY Social History   Tobacco Use  . Smoking status: Former Smoker    Quit date: 1983    Years since quitting: 39.1  . Smokeless tobacco: Never Used  Vaping Use  . Vaping Use: Never used  Substance Use Topics  . Alcohol use: Yes    Alcohol/week: 1.0 standard drink    Types: 1 Cans of beer per week    Comment: "every once in a blue moon"  . Drug use: No         OPHTHALMIC EXAM:  Base Eye Exam    Visual Acuity (Snellen - Linear)      Right Left   Dist cc 20/70- 20/30-   Dist ph cc NI NI   Correction: Glasses       Tonometry (Tonopen, 9:03 AM)      Right Left   Pressure 11 9       Pupils      Dark Light Shape React APD   Right 4 4 Round None None   Left 2 1.5 Round Minimal None       Visual Fields (Counting fingers)      Left Right    Full Full       Extraocular Movement      Right Left    Full Full       Neuro/Psych    Oriented x3: Yes   Mood/Affect: Normal       Dilation    Both eyes: 1.0% Mydriacyl, 2.5% Phenylephrine @ 9:01 AM        Slit Lamp and Fundus Exam    Slit Lamp Exam      Right Left   Lids/Lashes Dermatochalasis - upper lid Dermatochalasis - upper lid   Conjunctiva/Sclera White and quiet White and quiet   Cornea 2+ Punctate epithelial erosions, crocodile shegreen, well healed temporal cataract wounds, dry tear film 2+ Punctate epithelial erosions, crocodile shegreen, well healed temporal cataract wounds, dry tear film   Anterior Chamber Deep and quiet Deep and quiet   Iris Round and moderately dilated Round and poorly dilated to 4.36mm   Lens PC IOL in good position with open PC PC IOL in good position with open PC    Vitreous Vitreous syneresis Vitreous syneresis       Fundus Exam      Right Left   Disc mild Pallor, Sharp rim, peripapillary CNV with +exudate ST disc --  improving, no heme mild Pallor, Sharp rim   C/D Ratio 0.4 0.4   Macula Flat, Blunted foveal reflex, +peripapillary heme (resolved) and  exudate -- peristent, edema - stably improved, central VMT with cystic changes Flat, Blunted foveal reflex, fine Drusen, mild Retinal pigment epithelial mottling   Vessels Vascular attenuation, mild Tortuousity, perivascular exudation along superior venule -- persistent Vascular attenuation, mild Tortuousity   Periphery Attached, mild RPE changes, No heme  Attached, no heme, mild RPE changes            IMAGING AND PROCEDURES  Imaging and Procedures for @TODAY @  OCT, Retina - OU - Both Eyes       Right Eye Quality was good. Central Foveal Thickness: 370. Progression has been stable. Findings include abnormal foveal contour, vitreous traction, epiretinal membrane, intraretinal fluid, intraretinal hyper-reflective material, subretinal hyper-reflective material, outer retinal atrophy, pigment epithelial detachment, retinal drusen , no SRF (Persistent central VMT; stable improvement in peripapillary CNV and IRF; trace interval improvement in [facility]).   Left Eye Quality was good. Central Foveal Thickness: 319. Progression has been stable. Findings include vitreous traction, abnormal foveal contour, no SRF, subretinal hyper-reflective material, retinal drusen , intraretinal fluid (Stable VMT with central SRHM (vitelliform lesion) and cystic changes; persistent peripapillary SRHM -- small).   Notes *Images captured and stored on drive  Diagnosis / Impression:  OD: exudative ARMD w/ peripapillary CNVM -- stable improvement in peripapillary CNV and IRF; stable central VMT; trace interval improvement in [facility] OS: non-exu ARMD; +VMT -- Stable VMT with central SRHM (vitelliform lesion) and cystic changes,  persistent peripapillary SRHM -- small  Clinical management:  See below  Abbreviations: NFP - Normal foveal profile. CME - cystoid macular edema. PED - pigment epithelial detachment. IRF - intraretinal fluid. SRF - subretinal fluid. EZ - ellipsoid zone. ERM - epiretinal membrane. ORA - outer retinal atrophy. ORT - outer retinal tubulation. SRHM - subretinal hyper-reflective material        Intravitreal Injection, Pharmacologic Agent - OD - Right Eye       Time Out 04/16/2020. 9:26 AM. Confirmed correct patient, procedure, site, and patient consented.   Anesthesia Topical anesthesia was used. Anesthetic medications included Lidocaine 2%, Proparacaine 0.5%.   Procedure Preparation included 5% betadine to ocular surface, eyelid speculum. A (32g) needle was used.   Injection:  2 mg aflibercept 04/18/2020) SOLN   NDC: Gretta Cool, Lot: [id number], Expiration date: 07/20/2020   Route: Intravitreal, Site: Right Eye, Waste: 0.05 mL  Post-op Post injection exam found visual acuity of at least counting fingers. The patient tolerated the procedure well. There were no complications. The patient received written and verbal post procedure care education. Post injection medications were not given.                 ASSESSMENT/PLAN:    ICD-10-CM   1. Exudative age-related macular degeneration of right eye with active choroidal neovascularization (HCC)  H35.3211 Intravitreal Injection, Pharmacologic Agent - OD - Right Eye    aflibercept (EYLEA) SOLN 2 mg  2. Retinal edema  H35.81 OCT, Retina - OU - Both Eyes  3. Intermediate stage nonexudative age-related macular degeneration of left eye  H35.3122   4. Vitreomacular adhesion of both eyes  H43.823   5. Essential hypertension  I10   6. Hypertensive retinopathy of both eyes  H35.033   7. Pseudophakia of both eyes  Z96.1     1,2. Exudative age related macular degeneration, right eye   - incidental discovery of peripapillary CNV  w/ heme and  SRF OD during emergent visit for corneal abrasion OS             - S/P IVA OD #1 (03.22.21), #2 (04.19.21), #3 (05.26.21), #4 (06.23.21) -- IVA resistance  - S/P IVE OD #1 sample (07.22.21), #2 (08.24.21), #3 (10.13.21), #4 (11.18.21), #5 (12.30.21)  - OCT shows good response to IVE -- stable improvement in peripapillary IRF/SRF/IRHM  - BCVA stable at 20/70   - recommend IVE OD #6 today, 02.24.22 -- maintenance w/ f/u at 8 wks again  - pt wishes to be treated with IVE  - RBA of procedure discussed, questions answered  - IVE informed consent obtained, signed and scanned, 07.22.21  - Avastin informed consent obtained, signed and scanned, 03.22.21  - see procedure note  - Eylea4U benefits investigation started 07.22.21 -- approved for 2021  - f/u in 8 wks -- DFE/OCT, possible injection, tx and ext as able  3. Age related macular degeneration, non-exudative, left eye  - The incidence, anatomy, and pathology of dry AMD, risk of progression, and the AREDS and AREDS 2 study including smoking risks discussed with patient.  - recommend amsler grid monitoring   - OCT shows stable peripapillary SRHM -- watching closely for conversion to exudative disease  - f/u 8 wks  4. VMT OU  - mild -- OCT stable from prior  - review of OCTs since initial visit shows progressive traction -- hopefully will release uneventfully soon  - monitor  5,6. Hypertensive retinopathy OU  - discussed importance of tight BP control  - monitor  7. Pseudophakia OU  - s/p CE/IOL OU  - IOLs in good position, doing well  - monitor  Ophthalmic Meds Ordered this visit:  Meds ordered this encounter  Medications  . aflibercept (EYLEA) SOLN 2 mg      Return in about 8 weeks (around 06/11/2020) for f/u [name], DFE, OCT.  There are no Patient Instructions on file for this visit.  Explained the diagnoses, plan, and follow up with the patient and they expressed understanding.  Patient expressed understanding of the  importance of proper follow up care.   This document serves as a record of services personally performed by Karie Chimera, MD, PhD. It was created on their behalf by Joni Reining, an ophthalmic technician. The creation of this record is the provider's dictation and/or activities during the visit.    Electronically signed by: Joni Reining COA, 04/16/20  11:47 AM   This document serves as a record of services personally performed by Karie Chimera, MD, PhD. It was created on their behalf by Glee Arvin. Manson Passey, OA an ophthalmic technician. The creation of this record is the provider's dictation and/or activities during the visit.    Electronically signed by: Glee Arvin. Manson Passey, [place] 02.24.2022 11:47 AM   Karie Chimera, M.D., Ph.D. Diseases & Surgery of the Retina and Vitreous [facility]  I have reviewed the above documentation for accuracy and completeness, and I agree with the above. Karie Chimera, M.D., Ph.D. 04/16/20 11:47 AM   Abbreviations: M myopia (nearsighted); A astigmatism; H hyperopia (farsighted); P presbyopia; Mrx spectacle prescription;  CTL contact lenses; OD right eye; OS left eye; OU both eyes  XT exotropia; ET esotropia; PEK punctate epithelial keratitis; PEE punctate epithelial erosions; DES dry eye syndrome; MGD meibomian gland dysfunction; ATs artificial tears; PFAT's preservative free artificial tears; NSC nuclear sclerotic cataract; PSC posterior subcapsular cataract; ERM epi-retinal membrane;  PVD posterior vitreous detachment; RD retinal detachment; DM diabetes mellitus; DR diabetic retinopathy; NPDR non-proliferative diabetic retinopathy; PDR proliferative diabetic retinopathy; CSME clinically significant macular edema; DME diabetic macular edema; dbh dot blot hemorrhages; CWS cotton wool spot; POAG primary open angle glaucoma; C/D cup-to-disc ratio; HVF humphrey visual field; GVF goldmann visual field; OCT optical coherence tomography; IOP intraocular  pressure; BRVO Branch retinal vein occlusion; CRVO central retinal vein occlusion; CRAO central retinal artery occlusion; BRAO branch retinal artery occlusion; RT retinal tear; SB scleral buckle; PPV pars plana vitrectomy; VH Vitreous hemorrhage; PRP panretinal laser photocoagulation; IVK intravitreal kenalog; VMT vitreomacular traction; MH Macular hole;  NVD neovascularization of the disc; NVE neovascularization elsewhere; AREDS age related eye disease study; ARMD age related macular degeneration; POAG primary open angle glaucoma; EBMD epithelial/anterior basement membrane dystrophy; ACIOL anterior chamber intraocular lens; IOL intraocular lens; PCIOL posterior chamber intraocular lens; Phaco/IOL phacoemulsification with intraocular lens placement; PRK photorefractive keratectomy; LASIK laser assisted in situ keratomileusis; HTN hypertension; DM diabetes mellitus; COPD chronic obstructive pulmonary disease

## 2020-04-16 ENCOUNTER — Encounter (INDEPENDENT_AMBULATORY_CARE_PROVIDER_SITE_OTHER): Payer: Self-pay | Admitting: Ophthalmology

## 2020-04-16 ENCOUNTER — Ambulatory Visit (INDEPENDENT_AMBULATORY_CARE_PROVIDER_SITE_OTHER): Payer: Medicare Other | Admitting: Ophthalmology

## 2020-04-16 ENCOUNTER — Other Ambulatory Visit: Payer: Self-pay

## 2020-04-16 DIAGNOSIS — H3581 Retinal edema: Secondary | ICD-10-CM | POA: Diagnosis not present

## 2020-04-16 DIAGNOSIS — H353211 Exudative age-related macular degeneration, right eye, with active choroidal neovascularization: Secondary | ICD-10-CM

## 2020-04-16 DIAGNOSIS — Z961 Presence of intraocular lens: Secondary | ICD-10-CM

## 2020-04-16 DIAGNOSIS — H43823 Vitreomacular adhesion, bilateral: Secondary | ICD-10-CM | POA: Diagnosis not present

## 2020-04-16 DIAGNOSIS — H353122 Nonexudative age-related macular degeneration, left eye, intermediate dry stage: Secondary | ICD-10-CM

## 2020-04-16 DIAGNOSIS — H35033 Hypertensive retinopathy, bilateral: Secondary | ICD-10-CM

## 2020-04-16 DIAGNOSIS — I10 Essential (primary) hypertension: Secondary | ICD-10-CM

## 2020-04-16 MED ORDER — AFLIBERCEPT 2MG/0.05ML IZ SOLN FOR KALEIDOSCOPE
2.0000 mg | INTRAVITREAL | Status: AC | PRN
  Administered 2020-04-16: 2 mg via INTRAVITREAL

## 2020-06-08 NOTE — Progress Notes (Addendum)
[facility] - Clinic Note  06/11/2020     CHIEF COMPLAINT Patient presents for Retina Follow Up   HISTORY OF PRESENT ILLNESS: Eddie Kelly is a 87 y.o. male who presents to the clinic today for:   HPI    Retina Follow Up    Patient presents with  Wet AMD.  In right eye.  This started 8 weeks ago.  I, the attending physician,  performed the HPI with the patient and updated documentation appropriately.          Comments    Patient here for 8 weeks retina follow up for [name]. Patient states vision no difference. Some days better that others. No eye pain.        Last edited by Rennis ChrisZamora, Escarlet Saathoff, MD on 06/11/2020 11:14 AM. (History)    pt states vision seems stable, he is having some trouble with depth perception, especially when trying to park his car  Referring physician: Lucianne LeiUppin, Nina, MD [address] [place] [id number]  HISTORICAL INFORMATION:   Selected notes from the MEDICAL RECORD NUMBER Referred by Dr. Zetta Bills. Groat for concern of CNVM OD LEE: 05/04/2019 BCVA: 20/70-1            20/50+1   CURRENT MEDICATIONS: No current outpatient medications on file. (Ophthalmic Drugs)   No current facility-administered medications for this visit. (Ophthalmic Drugs)   Current Outpatient Medications (Other)  Medication Sig  . aspirin EC 81 MG tablet Take 81 mg by mouth daily.  . Cholecalciferol (VITAMIN D PO) Take 5,000 Units by mouth continuous dialysis.   Marland Kitchen. clopidogrel (PLAVIX) 75 MG tablet TAKE 1 TABLET(75 MG) BY MOUTH DAILY (Patient not taking: Reported on 04/16/2020)  . cyanocobalamin (,VITAMIN B-12,) 1000 MCG/ML injection Inject 1,000 mcg into the muscle every 30 (thirty) days.  . diclofenac Sodium (VOLTAREN) 1 % GEL SMARTSIG:4 Gram(s) Topical 3 Times Daily PRN  . furosemide (LASIX) 20 MG tablet Take 20 mg by mouth daily.  Marland Kitchen. levothyroxine (SYNTHROID) 100 MCG tablet Take 100 mcg by mouth daily.  Marland Kitchen. loratadine (CLARITIN) 10 MG tablet Take 10  mg by mouth daily.  Marland Kitchen. losartan (COZAAR) 100 MG tablet Take 100 mg by mouth daily.  . metoprolol tartrate (LOPRESSOR) 25 MG tablet Take 25 mg by mouth 2 (two) times daily.  . montelukast (SINGULAIR) 10 MG tablet Take 10 mg by mouth at bedtime.  . Multiple Vitamin (MULTIVITAMIN WITH MINERALS) TABS tablet Take 1 tablet by mouth daily.  . nitrofurantoin, macrocrystal-monohydrate, (MACROBID) 100 MG capsule Take 100 mg by mouth 2 (two) times daily.  . nitroGLYCERIN (NITROSTAT) 0.4 MG SL tablet Place 0.4 mg under the tongue every 5 (five) minutes as needed.  . silodosin (RAPAFLO) 4 MG CAPS capsule Take by mouth.  . tamsulosin (FLOMAX) 0.4 MG CAPS capsule Take 1 capsule by mouth daily.   No current facility-administered medications for this visit. (Other)      REVIEW OF SYSTEMS: ROS    Positive for: Genitourinary, Endocrine, Cardiovascular, Eyes, Psychiatric   Negative for: Constitutional, Gastrointestinal, Neurological, Skin, Musculoskeletal, HENT, Respiratory, Allergic/Imm, Heme/Lymph   Last edited by Laddie Aquaslarke, Rebecca S, COA on 06/11/2020  8:53 AM. (History)       ALLERGIES Allergies  Allergen Reactions  . Ciprofloxacin Other (See Comments)    Edema, generalized  . Levofloxacin     PAST MEDICAL HISTORY Past Medical History:  Diagnosis Date  . A-fib (HCC)   . Anxiety and depression   .  Aspiration pneumonia (HCC) 06/27/2019  . CAD (coronary artery disease)   . Chronic atrial fibrillation (HCC) 09/06/2019  . Epistaxis 06/27/2019  . Essential hypertension 04/12/2018  . Hypertension 09/11/2019  . Hypertensive retinopathy    OU  . Hypothyroidism   . Left inguinal hernia 08/10/2017  . Macular degeneration    Wet OD; Dry OS  . Permanent atrial fibrillation (HCC) 01/14/2019  . Postoperative examination 11/29/2017  . Preoperative cardiovascular examination 09/26/2017  . PVC (premature ventricular contraction)   . Sinus bradycardia   . Vitamin D deficiency    Past Surgical History:   Procedure Laterality Date  . CATARACT EXTRACTION Bilateral   . EYE SURGERY Bilateral    Cat Sx  . HERNIA REPAIR      FAMILY HISTORY Family History  Problem Relation Age of Onset  . Cancer Father   . Heart attack Father   . Heart disease Brother   . Diabetes Brother     SOCIAL HISTORY Social History   Tobacco Use  . Smoking status: Former Smoker    Quit date: 1983    Years since quitting: 39.3  . Smokeless tobacco: Never Used  Vaping Use  . Vaping Use: Never used  Substance Use Topics  . Alcohol use: Yes    Alcohol/week: 1.0 standard drink    Types: 1 Cans of beer per week    Comment: "every once in a blue moon"  . Drug use: No         OPHTHALMIC EXAM:  Base Eye Exam    Visual Acuity (Snellen - Linear)      Right Left   Dist cc 20/70 -2 20/30 -2   Dist ph cc NI 20/25 -1   Correction: Glasses       Tonometry (Tonopen, 8:49 AM)      Right Left   Pressure 10 09       Pupils      Dark Light Shape React APD   Right 4 4 Round NONE None   Left 2 1.5 Round Minimal None       Visual Fields (Counting fingers)      Left Right    Full Full       Extraocular Movement      Right Left    Full Full       Neuro/Psych    Oriented x3: Yes   Mood/Affect: Normal       Dilation    Both eyes: 1.0% Mydriacyl, 2.5% Phenylephrine @ 8:49 AM        Slit Lamp and Fundus Exam    Slit Lamp Exam      Right Left   Lids/Lashes Dermatochalasis - upper lid Dermatochalasis - upper lid   Conjunctiva/Sclera White and quiet White and quiet   Cornea 2+ Punctate epithelial erosions, crocodile shegreen, well healed temporal cataract wounds, dry tear film 2+ Punctate epithelial erosions, crocodile shegreen, well healed temporal cataract wounds, dry tear film   Anterior Chamber Deep and quiet Deep and quiet   Iris Round and moderately dilated Round and poorly dilated to 4.52mm   Lens PC IOL in good position with open PC PC IOL in good position with open PC   Vitreous  Vitreous syneresis Vitreous syneresis       Fundus Exam      Right Left   Disc mild Pallor, Sharp rim, peripapillary CNV with +exudate ST disc -- improving, no heme mild Pallor, Sharp rim   C/D Ratio 0.4 0.4  Macula Flat, Blunted foveal reflex, +peripapillary heme (resolved) and  exudate -- improving, edema - stably improved, central VMT with cystic changes Flat, Blunted foveal reflex, fine Drusen, mild Retinal pigment epithelial mottling, no heme   Vessels Vascular attenuation, mild Tortuousity, perivascular exudation along superior venule -- persistent Vascular attenuation, mild Tortuousity   Periphery Attached, mild RPE changes, No heme  Attached, no heme, mild RPE changes          Refraction    Wearing Rx      Sphere Cylinder Axis Add   Right -3.00 +3.00 005 +2.75   Left -1.50 +3.00 167 +2.75          IMAGING AND PROCEDURES  Imaging and Procedures for @TODAY @  OCT, Retina - OU - Both Eyes       Right Eye Quality was good. Central Foveal Thickness: 374. Progression has been stable. Findings include abnormal foveal contour, vitreous traction, epiretinal membrane, intraretinal fluid, intraretinal hyper-reflective material, subretinal hyper-reflective material, outer retinal atrophy, pigment epithelial detachment, retinal drusen , no SRF (Persistent central VMT; stable improvement in peripapillary CNV and IRF; trace interval improvement in [facility]).   Left Eye Quality was good. Central Foveal Thickness: 316. Progression has been stable. Findings include vitreous traction, abnormal foveal contour, no SRF, subretinal hyper-reflective material, retinal drusen , intraretinal fluid (Stable VMT with central SRHM (vitelliform lesion) and cystic changes; persistent peripapillary SRHM -- small, still no fluid).   Notes *Images captured and stored on drive  Diagnosis / Impression:  OD: exudative ARMD w/ peripapillary CNVM -- stable improvement in peripapillary CNV and IRF; stable central  VMT; mild interval improvement in [facility] OS: non-exu ARMD; +VMT -- Stable VMT with central SRHM (vitelliform lesion) and cystic changes, persistent peripapillary SRHM -- small, still no fluid  Clinical management:  See below  Abbreviations: NFP - Normal foveal profile. CME - cystoid macular edema. PED - pigment epithelial detachment. IRF - intraretinal fluid. SRF - subretinal fluid. EZ - ellipsoid zone. ERM - epiretinal membrane. ORA - outer retinal atrophy. ORT - outer retinal tubulation. SRHM - subretinal hyper-reflective material        Intravitreal Injection, Pharmacologic Agent - OD - Right Eye       Time Out 06/11/2020. 9:23 AM. Confirmed correct patient, procedure, site, and patient consented.   Anesthesia Topical anesthesia was used. Anesthetic medications included Lidocaine 2%, Proparacaine 0.5%.   Procedure Preparation included 5% betadine to ocular surface, eyelid speculum. A (32g) needle was used.   Injection:  2 mg aflibercept 06/13/2020) SOLN   NDC: Gretta Cool, Lot: [id number], Expiration date: 02/20/2021   Route: Intravitreal, Site: Right Eye, Waste: 0.05 mL  Post-op Post injection exam found visual acuity of at least counting fingers. The patient tolerated the procedure well. There were no complications. The patient received written and verbal post procedure care education. Post injection medications were not given.                 ASSESSMENT/PLAN:    ICD-10-CM   1. Exudative age-related macular degeneration of right eye with active choroidal neovascularization (HCC)  H35.3211 Intravitreal Injection, Pharmacologic Agent - OD - Right Eye    aflibercept (EYLEA) SOLN 2 mg  2. Retinal edema  H35.81 OCT, Retina - OU - Both Eyes  3. Intermediate stage nonexudative age-related macular degeneration of left eye  H35.3122   4. Vitreomacular adhesion of both eyes  H43.823   5. Essential hypertension  I10   6. Hypertensive retinopathy of both eyes  H35.033  7.  Pseudophakia of both eyes  Z96.1     1,2. Exudative age related macular degeneration, right eye   - incidental discovery of peripapillary CNV w/ heme and SRF OD during emergent visit for corneal abrasion OS             - S/P IVA OD #1 (03.22.21), #2 (04.19.21), #3 (05.26.21), #4 (06.23.21) -- IVA resistance  - S/P IVE OD #1 sample (07.22.21), #2 (08.24.21), #3 (10.13.21), #4 (11.18.21), #5 (12.30.21), #6 (02.24.22)  - OCT shows good response to IVE -- stable improvement in peripapillary IRF/SRF/IRHM  - BCVA stable at 20/70   - recommend IVE OD #7 today, 04.21.22 -- maintenance w/ ext f/u to 10 wks  - pt wishes to be treated with IVE  - RBA of procedure discussed, questions answered  - IVE informed consent obtained, signed and scanned, 07.22.21  - Avastin informed consent obtained, signed and scanned, 03.22.21  - see procedure note  - Eylea4U benefits investigation started 07.22.21 -- approved for 2021  - f/u in 10 wks -- DFE/OCT, possible injection, tx and ext as able  3. Age related macular degeneration, non-exudative, left eye  - The incidence, anatomy, and pathology of dry AMD, risk of progression, and the AREDS and AREDS 2 study including smoking risks discussed with patient.  - recommend amsler grid monitoring   - OCT shows stable peripapillary SRHM, still no fluid -- watching closely for conversion to exudative disease  - f/u 10 wks  4. VMT OU  - mild -- OCT stable from prior  - review of OCTs since initial visit shows progressive traction -- hopefully will release uneventfully soon  - monitor  5,6. Hypertensive retinopathy OU  - discussed importance of tight BP control  - monitor  7. Pseudophakia OU  - s/p CE/IOL OU  - IOLs in good position, doing well  - monitor  Ophthalmic Meds Ordered this visit:  Meds ordered this encounter  Medications  . aflibercept (EYLEA) SOLN 2 mg      Return in about 10 weeks (around 08/20/2020) for f/u [name], DFE, OCT.  There are  no Patient Instructions on file for this visit.  Explained the diagnoses, plan, and follow up with the patient and they expressed understanding.  Patient expressed understanding of the importance of proper follow up care.   This document serves as a record of services personally performed by Karie Chimera, MD, PhD. It was created on their behalf by Glee Arvin. Manson Passey, OA an ophthalmic technician. The creation of this record is the provider's dictation and/or activities during the visit.    Electronically signed by: Glee Arvin. Manson Passey, [place] 04.18.2022 11:16 AM   Karie Chimera, M.D., Ph.D. Diseases & Surgery of the Retina and Vitreous [facility]  I have reviewed the above documentation for accuracy and completeness, and I agree with the above. Karie Chimera, M.D., Ph.D. 06/11/20 11:16 AM   Abbreviations: M myopia (nearsighted); A astigmatism; H hyperopia (farsighted); P presbyopia; Mrx spectacle prescription;  CTL contact lenses; OD right eye; OS left eye; OU both eyes  XT exotropia; ET esotropia; PEK punctate epithelial keratitis; PEE punctate epithelial erosions; DES dry eye syndrome; MGD meibomian gland dysfunction; ATs artificial tears; PFAT's preservative free artificial tears; NSC nuclear sclerotic cataract; PSC posterior subcapsular cataract; ERM epi-retinal membrane; PVD posterior vitreous detachment; RD retinal detachment; DM diabetes mellitus; DR diabetic retinopathy; NPDR non-proliferative diabetic retinopathy; PDR proliferative diabetic retinopathy; CSME clinically significant macular  edema; DME diabetic macular edema; dbh dot blot hemorrhages; CWS cotton wool spot; POAG primary open angle glaucoma; C/D cup-to-disc ratio; HVF humphrey visual field; GVF goldmann visual field; OCT optical coherence tomography; IOP intraocular pressure; BRVO Branch retinal vein occlusion; CRVO central retinal vein occlusion; CRAO central retinal artery occlusion; BRAO branch retinal  artery occlusion; RT retinal tear; SB scleral buckle; PPV pars plana vitrectomy; VH Vitreous hemorrhage; PRP panretinal laser photocoagulation; IVK intravitreal kenalog; VMT vitreomacular traction; MH Macular hole;  NVD neovascularization of the disc; NVE neovascularization elsewhere; AREDS age related eye disease study; ARMD age related macular degeneration; POAG primary open angle glaucoma; EBMD epithelial/anterior basement membrane dystrophy; ACIOL anterior chamber intraocular lens; IOL intraocular lens; PCIOL posterior chamber intraocular lens; Phaco/IOL phacoemulsification with intraocular lens placement; PRK photorefractive keratectomy; LASIK laser assisted in situ keratomileusis; HTN hypertension; DM diabetes mellitus; COPD chronic obstructive pulmonary disease

## 2020-06-11 ENCOUNTER — Encounter (INDEPENDENT_AMBULATORY_CARE_PROVIDER_SITE_OTHER): Payer: Self-pay | Admitting: Ophthalmology

## 2020-06-11 ENCOUNTER — Other Ambulatory Visit: Payer: Self-pay

## 2020-06-11 ENCOUNTER — Ambulatory Visit (INDEPENDENT_AMBULATORY_CARE_PROVIDER_SITE_OTHER): Payer: Medicare Other | Admitting: Ophthalmology

## 2020-06-11 DIAGNOSIS — H3581 Retinal edema: Secondary | ICD-10-CM | POA: Diagnosis not present

## 2020-06-11 DIAGNOSIS — Z961 Presence of intraocular lens: Secondary | ICD-10-CM

## 2020-06-11 DIAGNOSIS — H353122 Nonexudative age-related macular degeneration, left eye, intermediate dry stage: Secondary | ICD-10-CM | POA: Diagnosis not present

## 2020-06-11 DIAGNOSIS — H35033 Hypertensive retinopathy, bilateral: Secondary | ICD-10-CM

## 2020-06-11 DIAGNOSIS — H353211 Exudative age-related macular degeneration, right eye, with active choroidal neovascularization: Secondary | ICD-10-CM | POA: Diagnosis not present

## 2020-06-11 DIAGNOSIS — H532 Diplopia: Secondary | ICD-10-CM

## 2020-06-11 DIAGNOSIS — H43823 Vitreomacular adhesion, bilateral: Secondary | ICD-10-CM

## 2020-06-11 DIAGNOSIS — I10 Essential (primary) hypertension: Secondary | ICD-10-CM

## 2020-06-11 MED ORDER — AFLIBERCEPT 2MG/0.05ML IZ SOLN FOR KALEIDOSCOPE
2.0000 mg | INTRAVITREAL | Status: AC | PRN
  Administered 2020-06-11: 2 mg via INTRAVITREAL

## 2020-08-04 ENCOUNTER — Ambulatory Visit: Payer: Medicare Other | Admitting: Cardiology

## 2020-08-18 NOTE — Progress Notes (Signed)
[facility] - Clinic Note  08/20/2020     CHIEF COMPLAINT Patient presents for Retina Follow Up   HISTORY OF PRESENT ILLNESS: Eddie Kelly is a 87 y.o. male who presents to the clinic today for:   HPI     Retina Follow Up   Patient presents with  Wet AMD.  In right eye.  This started 10 weeks ago.  I, the attending physician,  performed the HPI with the patient and updated documentation appropriately.        Comments   Patient here for 10 weeks retina follow up for [name]. Patient states vision not as good as would like. Seems to be getting worse reading. Print hard to read. No eye pain.       Last edited by [name], MD on 08/20/2020  4:15 PM.    pt states vision stable -- no change  Referring physician: [name], MD [address] [place],  [place] [address]  HISTORICAL INFORMATION:   Selected notes from the MEDICAL RECORD NUMBER Referred by Dr. Zetta Bills for concern of CNVM OD LEE: 05/04/2019 BCVA: 20/70-1            20/50+1   CURRENT MEDICATIONS: No current outpatient medications on file. (Ophthalmic Drugs)   No current facility-administered medications for this visit. (Ophthalmic Drugs)   Current Outpatient Medications (Other)  Medication Sig   aspirin EC 81 MG tablet Take 81 mg by mouth daily.   Cholecalciferol (VITAMIN D PO) Take 5,000 Units by mouth continuous dialysis.    clopidogrel (PLAVIX) 75 MG tablet TAKE 1 TABLET(75 MG) BY MOUTH DAILY (Patient not taking: Reported on 04/16/2020)   cyanocobalamin (,VITAMIN B-12,) 1000 MCG/ML injection Inject 1,000 mcg into the muscle every 30 (thirty) days.   diclofenac Sodium (VOLTAREN) 1 % GEL SMARTSIG:4 Gram(s) Topical 3 Times Daily PRN   furosemide (LASIX) 20 MG tablet Take 20 mg by mouth daily.   levothyroxine (SYNTHROID) 100 MCG tablet Take 100 mcg by mouth daily.   loratadine (CLARITIN) 10 MG tablet Take 10 mg by mouth daily.   losartan (COZAAR) 100 MG tablet Take  100 mg by mouth daily.   metoprolol tartrate (LOPRESSOR) 25 MG tablet Take 25 mg by mouth 2 (two) times daily.   montelukast (SINGULAIR) 10 MG tablet Take 10 mg by mouth at bedtime.   Multiple Vitamin (MULTIVITAMIN WITH MINERALS) TABS tablet Take 1 tablet by mouth daily.   nitrofurantoin, macrocrystal-monohydrate, (MACROBID) 100 MG capsule Take 100 mg by mouth 2 (two) times daily.   nitroGLYCERIN (NITROSTAT) 0.4 MG SL tablet Place 0.4 mg under the tongue every 5 (five) minutes as needed.   silodosin (RAPAFLO) 4 MG CAPS capsule Take by mouth.   tamsulosin (FLOMAX) 0.4 MG CAPS capsule Take 1 capsule by mouth daily.   No current facility-administered medications for this visit. (Other)      REVIEW OF SYSTEMS: ROS   Positive for: Genitourinary, Endocrine, Cardiovascular, Eyes, Psychiatric Negative for: Constitutional, Gastrointestinal, Neurological, Skin, Musculoskeletal, HENT, Respiratory, Allergic/Imm, Heme/Lymph Last edited by Laddie Aquas, COA on 08/20/2020  8:51 AM.        ALLERGIES Allergies  Allergen Reactions   Ciprofloxacin Other (See Comments)    Edema, generalized   Levofloxacin     PAST MEDICAL HISTORY Past Medical History:  Diagnosis Date   A-fib [facility])    Anxiety and depression    Aspiration pneumonia (HCC) 06/27/2019   CAD (coronary artery disease)  Chronic atrial fibrillation (HCC) 09/06/2019   Epistaxis 06/27/2019   Essential hypertension 04/12/2018   Hypertension 09/11/2019   Hypertensive retinopathy    OU   Hypothyroidism    Left inguinal hernia 08/10/2017   Macular degeneration    Wet OD; Dry OS   Permanent atrial fibrillation (HCC) 01/14/2019   Postoperative examination 11/29/2017   Preoperative cardiovascular examination 09/26/2017   PVC (premature ventricular contraction)    Sinus bradycardia    Vitamin D deficiency    Past Surgical History:  Procedure Laterality Date   CATARACT EXTRACTION Bilateral    EYE SURGERY Bilateral    Cat Sx   HERNIA  REPAIR      FAMILY HISTORY Family History  Problem Relation Age of Onset   Cancer Father    Heart attack Father    Heart disease Brother    Diabetes Brother     SOCIAL HISTORY Social History   Tobacco Use   Smoking status: Former    Pack years: 0.00    Types: Cigarettes    Quit date: 1983    Years since quitting: 39.5   Smokeless tobacco: Never  Vaping Use   Vaping Use: Never used  Substance Use Topics   Alcohol use: Yes    Alcohol/week: 1.0 standard drink    Types: 1 Cans of beer per week    Comment: "every once in a blue moon"   Drug use: No         OPHTHALMIC EXAM:  Base Eye Exam     Visual Acuity (Snellen - Linear)       Right Left   Dist cc 20/80 +2 20/30 -2   Dist ph cc 20/60 -1 20/25 -2    Correction: Glasses         Tonometry (Tonopen, 8:48 AM)       Right Left   Pressure 08 08         Pupils       Dark Light Shape React APD   Right 4 4 Round none None   Left 2 1.5 Round Minimal None         Visual Fields (Counting fingers)       Left Right    Full Full         Extraocular Movement       Right Left    Full Full         Neuro/Psych     Oriented x3: Yes   Mood/Affect: Normal         Dilation     Both eyes: 1.0% Mydriacyl, 2.5% Phenylephrine @ 8:48 AM           Slit Lamp and Fundus Exam     Slit Lamp Exam       Right Left   Lids/Lashes Dermatochalasis - upper lid Dermatochalasis - upper lid   Conjunctiva/Sclera White and quiet White and quiet   Cornea 2+ Punctate epithelial erosions, crocodile shegreen, well healed temporal cataract wounds, dry tear film 2+ Punctate epithelial erosions, crocodile shegreen, well healed temporal cataract wounds, dry tear film   Anterior Chamber Deep and quiet Deep and quiet   Iris Round and moderately dilated Round and poorly dilated to 4.58mm   Lens PC IOL in good position with open PC PC IOL in good position with open PC   Vitreous Vitreous syneresis Vitreous syneresis          Fundus Exam       Right Left  Disc mild Pallor, Sharp rim, peripapillary CNV with +exudate ST disc -- improving, no heme mild Pallor, Sharp rim   C/D Ratio 0.4 0.4   Macula Flat, Blunted foveal reflex, +peripapillary heme (resolved) and  exudate -- improving, edema - stably improved, central VMT with cystic changes Flat, Blunted foveal reflex, fine Drusen, mild Retinal pigment epithelial mottling, no heme   Vessels Vascular attenuation, mild Tortuousity, perivascular exudation along superior venule -- persistent Vascular attenuation, mild Tortuousity   Periphery Attached, mild RPE changes, No heme  Attached, no heme, mild RPE changes             Refraction     Wearing Rx       Sphere Cylinder Axis Add   Right -3.00 +3.00 005 +2.75   Left -1.50 +3.00 167 +2.75            IMAGING AND PROCEDURES  Imaging and Procedures for @TODAY @  OCT, Retina - OU - Both Eyes       Right Eye Quality was borderline. Central Foveal Thickness: 295. Progression has been stable. Findings include abnormal foveal contour, vitreous traction, epiretinal membrane, intraretinal fluid, intraretinal hyper-reflective material, subretinal hyper-reflective material, outer retinal atrophy, pigment epithelial detachment, retinal drusen , no SRF (Persistent central VMT; stable improvement in peripapillary CNV and IRF).   Left Eye Quality was good. Central Foveal Thickness: 320. Progression has been stable. Findings include vitreous traction, abnormal foveal contour, no SRF, subretinal hyper-reflective material, retinal drusen , intraretinal fluid (Stable VMT with central SRHM (vitelliform lesion) and cystic changes; persistent peripapillary SRHM -- small, still no fluid).   Notes *Images captured and stored on drive  Diagnosis / Impression:  OD: exudative ARMD w/ peripapillary CNVM -- stable improvement in peripapillary CNV and IRF; stable central VMT OS: non-exu ARMD; +VMT -- Stable VMT with  central SRHM (vitelliform lesion) and cystic changes, persistent peripapillary SRHM -- small, still no fluid  Clinical management:  See below  Abbreviations: NFP - Normal foveal profile. CME - cystoid macular edema. PED - pigment epithelial detachment. IRF - intraretinal fluid. SRF - subretinal fluid. EZ - ellipsoid zone. ERM - epiretinal membrane. ORA - outer retinal atrophy. ORT - outer retinal tubulation. SRHM - subretinal hyper-reflective material      Intravitreal Injection, Pharmacologic Agent - OD - Right Eye       Time Out 08/20/2020. 9:54 AM. Confirmed correct patient, procedure, site, and patient consented.   Anesthesia Topical anesthesia was used. Anesthetic medications included Lidocaine 2%, Proparacaine 0.5%.   Procedure Preparation included 5% betadine to ocular surface, eyelid speculum. A (32g) needle was used.   Injection: 2 mg aflibercept 2 MG/0.05ML   Route: Intravitreal, Site: Right Eye   NDC: 08/22/2020, Lot: [id number], Expiration date: 06/20/2021, Waste: 0.05 mL   Post-op Post injection exam found visual acuity of at least counting fingers. The patient tolerated the procedure well. There were no complications. The patient received written and verbal post procedure care education. Post injection medications were not given.               ASSESSMENT/PLAN:    ICD-10-CM   1. Exudative age-related macular degeneration of right eye with active choroidal neovascularization (HCC)  H35.3211 Intravitreal Injection, Pharmacologic Agent - OD - Right Eye    aflibercept (EYLEA) SOLN 2 mg    2. Retinal edema  H35.81 OCT, Retina - OU - Both Eyes    3. Intermediate stage nonexudative age-related macular degeneration of left eye  H35.3122  4. Vitreomacular adhesion of both eyes  H43.823     5. Essential hypertension  I10     6. Hypertensive retinopathy of both eyes  H35.033     7. Pseudophakia of both eyes  Z96.1        1,2. Exudative age related  macular degeneration, right eye   - incidental discovery of peripapillary CNV w/ heme and SRF OD during emergent visit for corneal abrasion OS             - S/P IVA OD #1 (03.22.21), #2 (04.19.21), #3 (05.26.21), #4 (06.23.21) -- IVA resistance  - S/P IVE OD #1 sample (07.22.21), #2 (08.24.21), #3 (10.13.21), #4 (11.18.21), #5 (12.30.21), #6 (02.24.22), #7 (04.21.22)  - OCT shows good response to IVE -- stable improvement in peripapillary IRF/SRF/IRHM at 10 wks  - BCVA stable at 20/70   - recommend IVE OD #8 today, 06.30.22 -- maintenance w/ ext f/u to 12 wks  - pt wishes to be treated with IVE  - RBA of procedure discussed, questions answered  - IVE informed consent obtained, signed and scanned, 07.22.21  - Avastin informed consent obtained, signed and scanned, 03.22.21  - see procedure note  - Eylea4U benefits investigation started 07.22.21 -- approved for 2021  - f/u in 12 wks -- DFE/OCT, possible injection, tx and ext as able  3. Age related macular degeneration, non-exudative, left eye  - The incidence, anatomy, and pathology of dry AMD, risk of progression, and the AREDS and AREDS 2 study including smoking risks discussed with patient.  - recommend amsler grid monitoring   - OCT shows stable peripapillary SRHM, still no fluid -- watching closely for conversion to exudative disease   - f/u 12 wks  4. VMT OU  - mild -- OCT stable from prior  - review of OCTs since initial visit shows progressive traction -- hopefully will release uneventfully soon  - monitor  5,6. Hypertensive retinopathy OU  - discussed importance of tight BP control  - monitor  7. Pseudophakia OU  - s/p CE/IOL OU  - IOLs in good position, doing well  - monitor  Ophthalmic Meds Ordered this visit:  Meds ordered this encounter  Medications   aflibercept (EYLEA) SOLN 2 mg       Return in about 12 weeks (around 11/12/2020) for f/u [name], DFE, OCT.  There are no Patient Instructions on file for this  visit.  Explained the diagnoses, plan, and follow up with the patient and they expressed understanding.  Patient expressed understanding of the importance of proper follow up care.   This document serves as a record of services personally performed by Karie Chimera, MD, PhD. It was created on their behalf by Joni Reining, an ophthalmic technician. The creation of this record is the provider's dictation and/or activities during the visit.    Electronically signed by: Joni Reining COA, 08/20/20  4:17 PM  This document serves as a record of services personally performed by Karie Chimera, MD, PhD. It was created on their behalf by Glee Arvin. Manson Passey, OA an ophthalmic technician. The creation of this record is the provider's dictation and/or activities during the visit.    Electronically signed by: Glee Arvin. Manson Passey, [place] 06.30.2022 4:17 PM  Karie Chimera, M.D., Ph.D. Diseases & Surgery of the Retina and Vitreous [facility] 08/20/2020  I have reviewed the above documentation for accuracy and completeness, and I agree with the above. Karie Chimera,  M.D., Ph.D. 08/20/20 4:17 PM   Abbreviations: M myopia (nearsighted); A astigmatism; H hyperopia (farsighted); P presbyopia; Mrx spectacle prescription;  CTL contact lenses; OD right eye; OS left eye; OU both eyes  XT exotropia; ET esotropia; PEK punctate epithelial keratitis; PEE punctate epithelial erosions; DES dry eye syndrome; MGD meibomian gland dysfunction; ATs artificial tears; PFAT's preservative free artificial tears; NSC nuclear sclerotic cataract; PSC posterior subcapsular cataract; ERM epi-retinal membrane; PVD posterior vitreous detachment; RD retinal detachment; DM diabetes mellitus; DR diabetic retinopathy; NPDR non-proliferative diabetic retinopathy; PDR proliferative diabetic retinopathy; CSME clinically significant macular edema; DME diabetic macular edema; dbh dot blot hemorrhages; CWS cotton wool spot; POAG  primary open angle glaucoma; C/D cup-to-disc ratio; HVF humphrey visual field; GVF goldmann visual field; OCT optical coherence tomography; IOP intraocular pressure; BRVO Branch retinal vein occlusion; CRVO central retinal vein occlusion; CRAO central retinal artery occlusion; BRAO branch retinal artery occlusion; RT retinal tear; SB scleral buckle; PPV pars plana vitrectomy; VH Vitreous hemorrhage; PRP panretinal laser photocoagulation; IVK intravitreal kenalog; VMT vitreomacular traction; MH Macular hole;  NVD neovascularization of the disc; NVE neovascularization elsewhere; AREDS age related eye disease study; ARMD age related macular degeneration; POAG primary open angle glaucoma; EBMD epithelial/anterior basement membrane dystrophy; ACIOL anterior chamber intraocular lens; IOL intraocular lens; PCIOL posterior chamber intraocular lens; Phaco/IOL phacoemulsification with intraocular lens placement; PRK photorefractive keratectomy; LASIK laser assisted in situ keratomileusis; HTN hypertension; DM diabetes mellitus; COPD chronic obstructive pulmonary disease

## 2020-08-20 ENCOUNTER — Other Ambulatory Visit: Payer: Self-pay

## 2020-08-20 ENCOUNTER — Ambulatory Visit (INDEPENDENT_AMBULATORY_CARE_PROVIDER_SITE_OTHER): Payer: Medicare Other | Admitting: Ophthalmology

## 2020-08-20 ENCOUNTER — Encounter (INDEPENDENT_AMBULATORY_CARE_PROVIDER_SITE_OTHER): Payer: Self-pay | Admitting: Ophthalmology

## 2020-08-20 DIAGNOSIS — H353122 Nonexudative age-related macular degeneration, left eye, intermediate dry stage: Secondary | ICD-10-CM | POA: Diagnosis not present

## 2020-08-20 DIAGNOSIS — H35033 Hypertensive retinopathy, bilateral: Secondary | ICD-10-CM

## 2020-08-20 DIAGNOSIS — I10 Essential (primary) hypertension: Secondary | ICD-10-CM

## 2020-08-20 DIAGNOSIS — H353211 Exudative age-related macular degeneration, right eye, with active choroidal neovascularization: Secondary | ICD-10-CM

## 2020-08-20 DIAGNOSIS — H43823 Vitreomacular adhesion, bilateral: Secondary | ICD-10-CM | POA: Diagnosis not present

## 2020-08-20 DIAGNOSIS — Z961 Presence of intraocular lens: Secondary | ICD-10-CM

## 2020-08-20 DIAGNOSIS — H3581 Retinal edema: Secondary | ICD-10-CM

## 2020-08-20 MED ORDER — AFLIBERCEPT 2MG/0.05ML IZ SOLN FOR KALEIDOSCOPE
2.0000 mg | INTRAVITREAL | Status: AC | PRN
  Administered 2020-08-20: 2 mg via INTRAVITREAL

## 2020-09-07 ENCOUNTER — Other Ambulatory Visit: Payer: Self-pay

## 2020-09-09 ENCOUNTER — Encounter: Payer: Self-pay | Admitting: Cardiology

## 2020-09-09 ENCOUNTER — Other Ambulatory Visit: Payer: Self-pay

## 2020-09-09 ENCOUNTER — Ambulatory Visit: Payer: Medicare Other | Admitting: Cardiology

## 2020-09-09 VITALS — BP 150/76 | HR 70 | Ht 72.0 in | Wt 187.0 lb

## 2020-09-09 DIAGNOSIS — I2510 Atherosclerotic heart disease of native coronary artery without angina pectoris: Secondary | ICD-10-CM | POA: Diagnosis not present

## 2020-09-09 DIAGNOSIS — I10 Essential (primary) hypertension: Secondary | ICD-10-CM | POA: Diagnosis not present

## 2020-09-09 DIAGNOSIS — I4821 Permanent atrial fibrillation: Secondary | ICD-10-CM

## 2020-09-09 DIAGNOSIS — Z95818 Presence of other cardiac implants and grafts: Secondary | ICD-10-CM | POA: Diagnosis not present

## 2020-09-09 NOTE — Progress Notes (Signed)
Cardiology Office Note:    Date:  09/09/2020   ID:  Eddie Kelly, DOB 1925-09-24, MRN [id number]  PCP:  [name], MD  Cardiologist:  Garwin Brothers, MD   Referring MD: [name], MD    ASSESSMENT:    1. Permanent atrial fibrillation (HCC)   2. Essential hypertension   3. Coronary artery disease involving native coronary artery of native heart without angina pectoris   4. Presence of Watchman left atrial appendage closure device    PLAN:    In order of problems listed above:  Permanent atrial fibrillation:I discussed with the patient atrial fibrillation, disease process. Management and therapy including rate and rhythm control, anticoagulation benefits and potential risks were discussed extensively with the patient. Patient had multiple questions which were answered to patient's satisfaction. Essential hypertension: Blood pressure stable and diet was emphasized.  He has an element of whitecoat hypertension.  I am not want to be too aggressive with his blood pressure given his age and possibility of hypotension and fall potential. Coronary artery disease: Stable at this time and diet was emphasized. Mixed dyslipidemia: On statin therapy and lipids followed by primary care.  He has an appointment coming up with primary care tomorrow and will get all blood work. Patient will be seen in follow-up appointment in 6 months or earlier if the patient has any concerns    Medication Adjustments/Labs and Tests Ordered: Current medicines are reviewed at length with the patient today.  Concerns regarding medicines are outlined above.  Orders Placed This Encounter  Procedures   EKG 12-Lead    No orders of the defined types were placed in this encounter.    No chief complaint on file.    History of Present Illness:    Eddie Kelly is a 87 y.o. male.  Patient has past medical history of coronary artery disease, permanent atrial fibrillation, essential hypertension and  dyslipidemia.  She denies any problems at this time and takes care of activities of daily living.  Patient is accompanied by his daughter-in-law.  At the time of my evaluation, the patient is alert awake oriented and in no distress.  Past Medical History:  Diagnosis Date   A-fib [facility])    Anxiety and depression    Aspiration pneumonia (HCC) 06/27/2019   CAD (coronary artery disease)    Chronic atrial fibrillation (HCC) 09/06/2019   Epistaxis 06/27/2019   Essential hypertension 04/12/2018   Hypertension 09/11/2019   Hypertensive retinopathy    OU   Hypothyroidism    Left inguinal hernia 08/10/2017   Macular degeneration    Wet OD; Dry OS   Permanent atrial fibrillation (HCC) 01/14/2019   Postoperative examination 11/29/2017   Preoperative cardiovascular examination 09/26/2017   Presence of Watchman left atrial appendage closure device 02/04/2020   PVC (premature ventricular contraction)    Sinus bradycardia    Vitamin D deficiency     Past Surgical History:  Procedure Laterality Date   CATARACT EXTRACTION Bilateral    EYE SURGERY Bilateral    Cat Sx   HERNIA REPAIR      Current Medications: Current Meds  Medication Sig   aspirin EC 81 MG tablet Take 81 mg by mouth daily.   cyanocobalamin (,VITAMIN B-12,) 1000 MCG/ML injection Inject 1,000 mcg into the muscle every 30 (thirty) days.   diclofenac Sodium (VOLTAREN) 1 % GEL Apply 1 application topically 3 (three) times daily as needed for pain.   furosemide (LASIX) 20 MG tablet Take 20 mg by  mouth daily.   levothyroxine (SYNTHROID) 100 MCG tablet Take 100 mcg by mouth daily.   loratadine (CLARITIN) 10 MG tablet Take 10 mg by mouth daily.   losartan (COZAAR) 100 MG tablet Take 100 mg by mouth daily.   metoprolol tartrate (LOPRESSOR) 25 MG tablet Take 25 mg by mouth 2 (two) times daily.   montelukast (SINGULAIR) 10 MG tablet Take 10 mg by mouth at bedtime.   Multiple Vitamin (MULTIVITAMIN WITH MINERALS) TABS tablet Take 1 tablet by mouth  daily.   nitrofurantoin, macrocrystal-monohydrate, (MACROBID) 100 MG capsule Take 100 mg by mouth daily.   nitroGLYCERIN (NITROSTAT) 0.4 MG SL tablet Place 0.4 mg under the tongue every 5 (five) minutes as needed for chest pain.   tamsulosin (FLOMAX) 0.4 MG CAPS capsule Take 1 capsule by mouth daily.     Allergies:   Ciprofloxacin and Levofloxacin   Social History   Socioeconomic History   Marital status: Married    Spouse name: Not on file   Number of children: Not on file   Years of education: Not on file   Highest education level: Not on file  Occupational History   Not on file  Tobacco Use   Smoking status: Former    Types: Cigarettes    Quit date: 38    Years since quitting: 39.5   Smokeless tobacco: Never  Vaping Use   Vaping Use: Never used  Substance and Sexual Activity   Alcohol use: Yes    Alcohol/week: 1.0 standard drink    Types: 1 Cans of beer per week    Comment: "every once in a blue moon"   Drug use: No   Sexual activity: Not on file  Other Topics Concern   Not on file  Social History Narrative   Not on file   Social Determinants of Health   Financial Resource Strain: Not on file  Food Insecurity: Not on file  Transportation Needs: Not on file  Physical Activity: Not on file  Stress: Not on file  Social Connections: Not on file     Family History: The patient's family history includes Cancer in his father; Diabetes in his brother; Heart attack in his father; Heart disease in his brother.  ROS:   Please see the history of present illness.    All other systems reviewed and are negative.  EKGs/Labs/Other Studies Reviewed:    The following studies were reviewed today: EKG reveals atrial fibrillation and nonspecific ST-T changes   Recent Labs: No results found for requested labs within last 8760 hours.  Recent Lipid Panel    Component Value Date/Time   CHOL 149 01/14/2019 1055   TRIG 54 01/14/2019 1055   HDL 45 01/14/2019 1055    CHOLHDL 3.3 01/14/2019 1055   LDLCALC 93 01/14/2019 1055    Physical Exam:    VS:  BP (!) 150/76   Pulse 70   Ht 6' (1.829 m)   Wt 187 lb (84.8 kg)   SpO2 96%   BMI 25.36 kg/m     Wt Readings from Last 3 Encounters:  09/09/20 187 lb (84.8 kg)  02/04/20 186 lb (84.4 kg)  07/31/19 194 lb (88 kg)     GEN: Patient is in no acute distress HEENT: Normal NECK: No JVD; No carotid bruits LYMPHATICS: No lymphadenopathy CARDIAC: Hear sounds regular, 2/6 systolic murmur at the apex. RESPIRATORY:  Clear to auscultation without rales, wheezing or rhonchi  ABDOMEN: Soft, non-tender, non-distended MUSCULOSKELETAL:  No edema; No deformity  SKIN:  Warm and dry NEUROLOGIC:  Alert and oriented x 3 PSYCHIATRIC:  Normal affect   Signed, Garwin Brothers, MD  09/09/2020 11:40 AM    [facility]

## 2020-09-09 NOTE — Patient Instructions (Signed)

## 2020-11-09 NOTE — Progress Notes (Addendum)
[facility] Note  11/12/2020     CHIEF COMPLAINT Patient presents for Retina Follow Up   HISTORY OF PRESENT ILLNESS: Eddie Kelly is a 87 y.o. male who presents to the clinic today for:   HPI     Retina Follow Up   Patient presents with  Wet AMD.  In right eye.  Duration of 12 weeks.  Since onset it is stable.  I, the attending physician,  performed the HPI with the patient and updated documentation appropriately.        Comments   Pt states he saw Dr. Katy Fitch recently and he said his vision was stable from last year, he did not get a new glasses rx, no new fol or floaters, AT's PRN for itching      Last edited by Bernarda Caffey, MD on 11/12/2020  7:08 PM.    pt states vision stable -- no change  Referring physician: Warden Fillers, [address] [place],  [place] [id number]  HISTORICAL INFORMATION:   Selected notes from the MEDICAL RECORD NUMBER Referred by Dr. Shirleen Schirmer for concern of CNVM OD LEE: 05/04/2019 BCVA: 20/70-1            20/50+1   CURRENT MEDICATIONS: No current outpatient medications on file. (Ophthalmic Drugs)   No current facility-administered medications for this visit. (Ophthalmic Drugs)   Current Outpatient Medications (Other)  Medication Sig   aspirin EC 81 MG tablet Take 81 mg by mouth daily.   cyanocobalamin (,VITAMIN B-12,) 1000 MCG/ML injection Inject 1,000 mcg into the muscle every 30 (thirty) days.   diclofenac Sodium (VOLTAREN) 1 % GEL Apply 1 application topically 3 (three) times daily as needed for pain.   furosemide (LASIX) 20 MG tablet Take 20 mg by mouth daily.   levothyroxine (SYNTHROID) 100 MCG tablet Take 100 mcg by mouth daily.   loratadine (CLARITIN) 10 MG tablet Take 10 mg by mouth daily.   losartan (COZAAR) 100 MG tablet Take 100 mg by mouth daily.   metoprolol tartrate (LOPRESSOR) 25 MG tablet Take 25 mg by mouth 2 (two) times daily.   montelukast (SINGULAIR) 10 MG tablet Take 10  mg by mouth at bedtime.   Multiple Vitamin (MULTIVITAMIN WITH MINERALS) TABS tablet Take 1 tablet by mouth daily.   nitrofurantoin, macrocrystal-monohydrate, (MACROBID) 100 MG capsule Take 100 mg by mouth daily.   nitroGLYCERIN (NITROSTAT) 0.4 MG SL tablet Place 0.4 mg under the tongue every 5 (five) minutes as needed for chest pain.   tamsulosin (FLOMAX) 0.4 MG CAPS capsule Take 1 capsule by mouth daily.   No current facility-administered medications for this visit. (Other)    REVIEW OF SYSTEMS:      ALLERGIES Allergies  Allergen Reactions   Ciprofloxacin Other (See Comments)    Edema, generalized   Levofloxacin     PAST MEDICAL HISTORY Past Medical History:  Diagnosis Date   A-fib [facility])    Anxiety and depression    Aspiration pneumonia ([place]) 06/27/2019   CAD (coronary artery disease)    Chronic atrial fibrillation ([place]) 09/06/2019   Epistaxis 06/27/2019   Essential hypertension 04/12/2018   Hypertension 09/11/2019   Hypertensive retinopathy    OU   Hypothyroidism    Left inguinal hernia 08/10/2017   Macular degeneration    Wet OD; Dry OS   Permanent atrial fibrillation ([place]) 01/14/2019   Postoperative examination 11/29/2017   Preoperative cardiovascular examination 09/26/2017   Presence of Watchman left  atrial appendage closure device 02/04/2020   PVC (premature ventricular contraction)    Sinus bradycardia    Vitamin D deficiency    Past Surgical History:  Procedure Laterality Date   CATARACT EXTRACTION Bilateral    EYE SURGERY Bilateral    Cat Sx   HERNIA REPAIR      FAMILY HISTORY Family History  Problem Relation Age of Onset   Cancer Father    Heart attack Father    Heart disease Brother    Diabetes Brother     SOCIAL HISTORY Social History   Tobacco Use   Smoking status: Former    Types: Cigarettes    Quit date: 1983    Years since quitting: 39.7   Smokeless tobacco: Never  Vaping Use   Vaping Use: Never used  Substance Use Topics   Alcohol  use: Yes    Alcohol/week: 1.0 standard drink    Types: 1 Cans of beer per week    Comment: "every once in a blue moon"   Drug use: No         OPHTHALMIC EXAM:  Base Eye Exam     Visual Acuity (Snellen - Linear)       Right Left   Dist [place] 20/70 -2 20/60 -2   Dist ph [place] NI 20/30         Tonometry (Tonopen, 8:57 AM)       Right Left   Pressure 8 9         Pupils       Dark Light Shape React APD   Right 4 4 Round none None   Left 2 1.5 Round Minimal None         Visual Fields (Counting fingers)       Left Right    Full Full         Extraocular Movement       Right Left    Full, Ortho Full, Ortho         Neuro/Psych     Oriented x3: Yes   Mood/Affect: Normal           Slit Lamp and Fundus Exam     Slit Lamp Exam       Right Left   Lids/Lashes Dermatochalasis - upper lid Dermatochalasis - upper lid   Conjunctiva/Sclera White and quiet White and quiet   Cornea 2+ Punctate epithelial erosions, crocodile shegreen, well healed temporal cataract wounds, dry tear film 2+ Punctate epithelial erosions, crocodile shegreen, well healed temporal cataract wounds, dry tear film   Anterior Chamber Deep and quiet Deep and quiet   Iris Round and moderately dilated Round and poorly dilated to 4.31m   Lens PC IOL in good position with open PC PC IOL in good position with open PC   Vitreous Vitreous syneresis Vitreous syneresis         Fundus Exam       Right Left   Disc mild Pallor, Sharp rim, peripapillary CNV with +exudate ST disc -- stably improving, no heme mild Pallor, Sharp rim; peripapillary CNV with small pocket of SRF   C/D Ratio 0.4 0.4   Macula Flat, Blunted foveal reflex, +peripapillary heme (resolved) and  exudate -- improving, edema - stably improved, central VMT with cystic changes Flat, Blunted foveal reflex, fine Drusen, mild Retinal pigment epithelial mottling, no heme, focal edema nasal mac, peripapillary CNV with shallow SRF    Vessels Vascular attenuation, mild Tortuousity, perivascular exudation along superior venule -- persistent  Vascular attenuation, mild Tortuousity   Periphery Attached, mild RPE changes, No heme  Attached, no heme, mild RPE changes              IMAGING AND PROCEDURES  Imaging and Procedures for _0 @  OCT, Retina - OU - Both Eyes       Right Eye Quality was good. Central Foveal Thickness: 362. Progression has been stable. Findings include abnormal foveal contour, vitreous traction, epiretinal membrane, intraretinal fluid, intraretinal hyper-reflective material, subretinal hyper-reflective material, outer retinal atrophy, pigment epithelial detachment, retinal drusen , no SRF (Persistent central VMT; stable improvement in peripapillary CNV and IRF).   Left Eye Quality was good. Central Foveal Thickness: 317. Progression has worsened. Findings include vitreous traction, subretinal hyper-reflective material, retinal drusen , intraretinal fluid, abnormal foveal contour, subretinal fluid (Stable VMT with central SRHM (vitelliform lesion) and cystic changes; persistent peripapillary SRHM -- small, but now with focal pocket of SRF).   Notes *Images captured and stored on drive  Diagnosis / Impression:  OD: exudative ARMD w/ peripapillary CNVM -- stable improvement in peripapillary CNV and IRF; stable central VMT OS: exudative ARMD -- Stable VMT with central SRHM (vitelliform lesion) and cystic changes; persistent peripapillary SRHM -- small, but now with focal pocket of SRF surrounding  Clinical management:  See below  Abbreviations: NFP - Normal foveal profile. CME - cystoid macular edema. PED - pigment epithelial detachment. IRF - intraretinal fluid. SRF - subretinal fluid. EZ - ellipsoid zone. ERM - epiretinal membrane. ORA - outer retinal atrophy. ORT - outer retinal tubulation. SRHM - subretinal hyper-reflective material      Intravitreal Injection, Pharmacologic Agent - OD - Right  Eye       Time Out 11/12/2020. 9:06 AM. Confirmed correct patient, procedure, site, and patient consented.   Anesthesia Topical anesthesia was used. Anesthetic medications included Lidocaine 2%, Proparacaine 0.5%.   Procedure Preparation included 5% betadine to ocular surface, eyelid speculum. A (32g) needle was used.   Injection: 2 mg aflibercept 2 MG/0.05ML   Route: Intravitreal, Site: Right Eye   NDC: A3590391, Lot: [id number], Expiration date: 08/20/2021, Waste: 0.05 mL   Post-op Post injection exam found visual acuity of at least counting fingers. The patient tolerated the procedure well. There were no complications. The patient received written and verbal post procedure care education. Post injection medications were not given.      Intravitreal Injection, Pharmacologic Agent - OS - Left Eye       Time Out 11/12/2020. 9:06 AM. Confirmed correct patient, procedure, site, and patient consented.   Anesthesia Topical anesthesia was used. Anesthetic medications included Lidocaine 2%, Proparacaine 0.5%.   Procedure Preparation included 5% betadine to ocular surface, eyelid speculum. A (32g) needle was used.   Injection: 2 mg aflibercept 2 MG/0.05ML   Route: Intravitreal, Site: Left Eye   NDC: A3590391, Lot: [id number], Expiration date: 07/13/2021, Waste: 0.05 mL   Post-op Post injection exam found visual acuity of at least counting fingers. The patient tolerated the procedure well. There were no complications. The patient received written and verbal post procedure care education. Post injection medications were not given.            ASSESSMENT/PLAN:    ICD-10-CM   1. Exudative age-related macular degeneration of both eyes with active choroidal neovascularization (HCC)  H35.3231 Intravitreal Injection, Pharmacologic Agent - OD - Right Eye    Intravitreal Injection, Pharmacologic Agent - OS - Left Eye    aflibercept (EYLEA) SOLN 2 mg  aflibercept (EYLEA) SOLN  2 mg    2. Retinal edema  H35.81 OCT, Retina - OU - Both Eyes    3. Vitreomacular adhesion of both eyes  H43.823     4. Essential hypertension  I10     5. Hypertensive retinopathy of both eyes  H35.033     6. Pseudophakia of both eyes  Z96.1      1,2. Exudative age related macular degeneration, both eye s  - OD incidental discovery of peripapillary CNV w/ heme and SRF during emergency visit for corneal abrasion OS  - OS Interval conversion of Exudative ARMD noted on 09.22.22 visit OD:             - S/P IVA OD #1 (03.22.21), #2 (04.19.21), #3 (05.26.21), #4 (06.23.21) -- IVA resistance  - S/P IVE OD #1 sample (07.22.21), #2 (08.24.21), #3 (10.13.21), #4 (11.18.21), #5 (12.30.21), #6 (02.24.22), #7 (04.21.22), #8 (06.30.22)  - BCVA stable at 20/70   - OCT OD shows stable improvement in peripapillary IRF/SRF/IRHM at 12 wks  - recommend IVE OD #9 today, 09.22.22 -- maintenance w/ f/u in 12-14 wks OS: - OCT OS shows new peripapillary SRF on 09.22.22 - discussed findings, prognosis and treatment options - recommend IVE OS #1 today, 09.22.22  - pt wishes to be treated with IVE OS  - RBA of procedure discussed, questions answered  - IVE informed consent obtained, signed and scanned, 09.22.22 OU  - Avastin informed consent obtained, signed and scanned, 03.22.21  - see procedure note  - [email or web address] benefits investigation started 07.22.21 -- approved for 2022  - f/u in 4 weeks -- DFE, OCT, possible injection OS  3. VMT OU  - mild -- OCT stable from prior  - review of OCTs since initial visit shows progressive traction -- hopefully will release uneventfully soon  - monitor  4,5. Hypertensive retinopathy OU  - discussed importance of tight BP control  - monitor  6. Pseudophakia OU  - s/p CE/IOL OU  - IOLs in good position, doing well  - monitor  Ophthalmic Meds Ordered this visit:  Meds ordered this encounter  Medications   aflibercept (EYLEA) SOLN 2 mg   aflibercept (EYLEA) SOLN 2  mg      Return in 4 weeks (on 12/10/2020) for ex ARMD OU - DFE, OCT, possible injection OS.  There are no Patient Instructions on file for this visit.  Explained the diagnoses, plan, and follow up with the patient and they expressed understanding.  Patient expressed understanding of the importance of proper follow up care.   This document serves as a record of services personally performed by Gardiner Sleeper, MD, PhD. It was created on their behalf by San Jetty. Owens Shark, OA an ophthalmic technician. The creation of this record is the provider's dictation and/or activities during the visit.    Electronically signed by: San Jetty. Owens Shark, [place] 09.19.2022 7:45 PM  Gardiner Sleeper, M.D., Ph.D. Diseases & Surgery of the Retina and Vitreous [facility]  I have reviewed the above documentation for accuracy and completeness, and I agree with the above. Gardiner Sleeper, M.D., Ph.D. 11/12/20 7:45 PM  Abbreviations: M myopia (nearsighted); A astigmatism; H hyperopia (farsighted); P presbyopia; Mrx spectacle prescription;  CTL contact lenses; OD right eye; OS left eye; OU both eyes  XT exotropia; ET esotropia; PEK punctate epithelial keratitis; PEE punctate epithelial erosions; DES dry eye syndrome; MGD meibomian gland dysfunction; ATs artificial tears; PFAT's preservative free artificial  tears; [place] nuclear sclerotic cataract; PSC posterior subcapsular cataract; ERM epi-retinal membrane; PVD posterior vitreous detachment; RD retinal detachment; DM diabetes mellitus; DR diabetic retinopathy; NPDR non-proliferative diabetic retinopathy; PDR proliferative diabetic retinopathy; CSME clinically significant macular edema; DME diabetic macular edema; dbh dot blot hemorrhages; CWS cotton wool spot; POAG primary open angle glaucoma; C/D cup-to-disc ratio; HVF humphrey visual field; GVF goldmann visual field; OCT optical coherence tomography; IOP intraocular pressure; BRVO Branch retinal vein  occlusion; CRVO central retinal vein occlusion; CRAO central retinal artery occlusion; BRAO branch retinal artery occlusion; RT retinal tear; SB scleral buckle; PPV pars plana vitrectomy; VH Vitreous hemorrhage; PRP panretinal laser photocoagulation; IVK intravitreal kenalog; VMT vitreomacular traction; MH Macular hole;  NVD neovascularization of the disc; NVE neovascularization elsewhere; AREDS age related eye disease study; ARMD age related macular degeneration; POAG primary open angle glaucoma; EBMD epithelial/anterior basement membrane dystrophy; ACIOL anterior chamber intraocular lens; IOL intraocular lens; PCIOL posterior chamber intraocular lens; Phaco/IOL phacoemulsification with intraocular lens placement; [place] photorefractive keratectomy; LASIK laser assisted in situ keratomileusis; HTN hypertension; DM diabetes mellitus; COPD chronic obstructive pulmonary disease

## 2020-11-12 ENCOUNTER — Other Ambulatory Visit: Payer: Self-pay

## 2020-11-12 ENCOUNTER — Ambulatory Visit (INDEPENDENT_AMBULATORY_CARE_PROVIDER_SITE_OTHER): Payer: Medicare Other | Admitting: Ophthalmology

## 2020-11-12 ENCOUNTER — Encounter (INDEPENDENT_AMBULATORY_CARE_PROVIDER_SITE_OTHER): Payer: Self-pay | Admitting: Ophthalmology

## 2020-11-12 DIAGNOSIS — I10 Essential (primary) hypertension: Secondary | ICD-10-CM

## 2020-11-12 DIAGNOSIS — H43823 Vitreomacular adhesion, bilateral: Secondary | ICD-10-CM | POA: Diagnosis not present

## 2020-11-12 DIAGNOSIS — H35033 Hypertensive retinopathy, bilateral: Secondary | ICD-10-CM

## 2020-11-12 DIAGNOSIS — H3581 Retinal edema: Secondary | ICD-10-CM

## 2020-11-12 DIAGNOSIS — Z961 Presence of intraocular lens: Secondary | ICD-10-CM

## 2020-11-12 DIAGNOSIS — H353211 Exudative age-related macular degeneration, right eye, with active choroidal neovascularization: Secondary | ICD-10-CM

## 2020-11-12 DIAGNOSIS — H532 Diplopia: Secondary | ICD-10-CM

## 2020-11-12 DIAGNOSIS — H353122 Nonexudative age-related macular degeneration, left eye, intermediate dry stage: Secondary | ICD-10-CM

## 2020-11-12 DIAGNOSIS — H353231 Exudative age-related macular degeneration, bilateral, with active choroidal neovascularization: Secondary | ICD-10-CM

## 2020-11-12 MED ORDER — AFLIBERCEPT 2MG/0.05ML IZ SOLN FOR KALEIDOSCOPE
2.0000 mg | INTRAVITREAL | Status: AC | PRN
  Administered 2020-11-12: 2 mg via INTRAVITREAL

## 2020-12-07 NOTE — Progress Notes (Signed)
[facility] Note  12/11/2020     CHIEF COMPLAINT Patient presents for Retina Follow Up   HISTORY OF PRESENT ILLNESS: Eddie Kelly is a 87 y.o. male who presents to the clinic today for:   HPI     Retina Follow Up   Patient presents with  Wet AMD.  In both eyes.  This started 4 weeks ago.  I, the attending physician,  performed the HPI with the patient and updated documentation appropriately.        Comments   Patent here for 4 weeks retina follow up for exu ARMD OU. Patient states vision has had a floater this week. Hadn't had one before that lasted this long. It's a hair like. No eye pain.       Last edited by Eddie Caffey, MD on 12/11/2020 11:57 AM.    Eddie Kelly an occasional floater OS following the injection.  Referring physician: Warden Fillers, MD [address] [place],  [place] [id number]  HISTORICAL INFORMATION:   Selected notes from the MEDICAL RECORD NUMBER Referred by Dr. Shirleen Kelly for concern of CNVM OD LEE: 05/04/2019 BCVA: 20/70-1            20/50+1   CURRENT MEDICATIONS: No current outpatient medications on file. (Ophthalmic Drugs)   No current facility-administered medications for this visit. (Ophthalmic Drugs)   Current Outpatient Medications (Other)  Medication Sig   aspirin EC 81 MG tablet Take 81 mg by mouth daily.   cyanocobalamin (,VITAMIN B-12,) 1000 MCG/ML injection Inject 1,000 mcg into the muscle every 30 (thirty) days.   diclofenac Sodium (VOLTAREN) 1 % GEL Apply 1 application topically 3 (three) times daily as needed for pain.   furosemide (LASIX) 20 MG tablet Take 20 mg by mouth daily.   levothyroxine (SYNTHROID) 100 MCG tablet Take 100 mcg by mouth daily.   loratadine (CLARITIN) 10 MG tablet Take 10 mg by mouth daily.   losartan (COZAAR) 100 MG tablet Take 100 mg by mouth daily.   metoprolol tartrate (LOPRESSOR) 25 MG tablet Take 25 mg by mouth 2 (two) times daily.   montelukast (SINGULAIR) 10  MG tablet Take 10 mg by mouth at bedtime.   Multiple Vitamin (MULTIVITAMIN WITH MINERALS) TABS tablet Take 1 tablet by mouth daily.   nitrofurantoin, macrocrystal-monohydrate, (MACROBID) 100 MG capsule Take 100 mg by mouth daily.   nitroGLYCERIN (NITROSTAT) 0.4 MG SL tablet Place 0.4 mg under the tongue every 5 (five) minutes as needed for chest pain.   tamsulosin (FLOMAX) 0.4 MG CAPS capsule Take 1 capsule by mouth daily.   No current facility-administered medications for this visit. (Other)   REVIEW OF SYSTEMS: ROS   Positive for: Genitourinary, Endocrine, Cardiovascular, Eyes, Psychiatric Negative for: Constitutional, Gastrointestinal, Neurological, Skin, Musculoskeletal, HENT, Respiratory, Allergic/Imm, Heme/Lymph Last edited by Eddie Kelly, COA on 12/11/2020  8:53 AM.    ALLERGIES Allergies  Allergen Reactions   Ciprofloxacin Other (See Comments)    Edema, generalized   Levofloxacin    PAST MEDICAL HISTORY Past Medical History:  Diagnosis Date   A-fib [facility])    Anxiety and depression    Aspiration pneumonia ([place]) 06/27/2019   CAD (coronary artery disease)    Chronic atrial fibrillation ([place]) 09/06/2019   Epistaxis 06/27/2019   Essential hypertension 04/12/2018   Hypertension 09/11/2019   Hypertensive retinopathy    OU   Hypothyroidism    Left inguinal hernia 08/10/2017   Macular degeneration    Wet  OD; Dry OS   Permanent atrial fibrillation ([place]) 01/14/2019   Postoperative examination 11/29/2017   Preoperative cardiovascular examination 09/26/2017   Presence of Watchman left atrial appendage closure device 02/04/2020   PVC (premature ventricular contraction)    Sinus bradycardia    Vitamin D deficiency    Past Surgical History:  Procedure Laterality Date   CATARACT EXTRACTION Bilateral    EYE SURGERY Bilateral    Cat Sx   HERNIA REPAIR     FAMILY HISTORY Family History  Problem Relation Age of Onset   Cancer Father    Heart attack Father    Heart disease  Brother    Diabetes Brother    SOCIAL HISTORY Social History   Tobacco Use   Smoking status: Former    Types: Cigarettes    Quit date: 1983    Years since quitting: 39.8   Smokeless tobacco: Never  Vaping Use   Vaping Use: Never used  Substance Use Topics   Alcohol use: Yes    Alcohol/week: 1.0 standard drink    Types: 1 Cans of beer per week    Comment: "every once in a blue moon"   Drug use: No       OPHTHALMIC EXAM: Base Eye Exam     Visual Acuity (Snellen - Linear)       Right Left   Dist cc 20/80 -1 20/60 +2   Dist ph cc 20/70 -2 20/40 -2    Correction: Glasses         Tonometry (Tonopen, 8:48 AM)       Right Left   Pressure 08 07         Pupils       Dark Light Shape React APD   Right 4 4 Round NR None   Left 2 1.5 Round Minimal None         Visual Fields (Counting fingers)       Left Right    Full Full         Extraocular Movement       Right Left    Full, Ortho Full, Ortho         Neuro/Psych     Oriented x3: Yes   Mood/Affect: Normal         Dilation     Both eyes: 1.0% Mydriacyl, 2.5% Phenylephrine @ 8:48 AM           Slit Lamp and Fundus Exam     Slit Lamp Exam       Right Left   Lids/Lashes Dermatochalasis - upper lid Dermatochalasis - upper lid   Conjunctiva/Sclera White and quiet White and quiet   Cornea 2+ Punctate epithelial erosions, crocodile shegreen, well healed temporal cataract wounds, dry tear film 2+ Punctate epithelial erosions, crocodile shegreen, well healed temporal cataract wounds, dry tear film   Anterior Chamber Deep and quiet Deep and quiet   Iris Round and moderately dilated Round and poorly dilated to 4.63m   Lens PC IOL in good position with open PC PC IOL in good position with open PC   Vitreous Vitreous syneresis Vitreous syneresis         Fundus Exam       Right Left   Disc mild Pallor, Sharp rim, peripapillary CNV with +exudate ST disc -- stably improving, no heme mild  Pallor, Sharp rim; peripapillary CNV with small pocket of SRF-improved   C/D Ratio 0.4 0.4   Macula Flat, Blunted foveal reflex, +peripapillary heme (  resolved) and  exudate -- improving, edema - stably improved, central VMT with cystic changes Flat, Blunted foveal reflex, fine Drusen, mild Retinal pigment epithelial mottling, no heme, focal edema nasal mac--improved, peripapillary CNV with shallow SRF resolved   Vessels Vascular attenuation, mild Tortuousity, perivascular exudation along superior venule -- persistent Vascular attenuation, mild Tortuousity   Periphery Attached, mild RPE changes, No heme  Attached, no heme, mild RPE changes             Refraction     Wearing Rx       Sphere Cylinder Axis Add   Right -3.00 +3.00 005 +2.75   Left -1.50 +3.00 167 +2.75            IMAGING AND PROCEDURES  Imaging and Procedures for _0 @  OCT, Retina - OU - Both Eyes       Right Eye Quality was good. Central Foveal Thickness: 322. Progression has been stable. Findings include abnormal foveal contour, vitreous traction, epiretinal membrane, intraretinal fluid, intraretinal hyper-reflective material, subretinal hyper-reflective material, outer retinal atrophy, pigment epithelial detachment, retinal drusen , no SRF (Persistent central VMT; stable improvement in peripapillary CNV and IRF).   Left Eye Quality was good. Central Foveal Thickness: 345. Progression has improved. Findings include vitreous traction, subretinal hyper-reflective material, retinal drusen , intraretinal fluid, abnormal foveal contour, subretinal fluid (+ VMT with central SRHM (vitelliform lesion) and mild interval increase in cystic changes; persistent peripapillary SRHM -- small--interval improvement in pocket of SRF).   Notes *Images captured and stored on drive  Diagnosis / Impression:  OD: exudative ARMD w/ peripapillary CNVM -- stable improvement in peripapillary CNV and IRF; stable central VMT OS:  exudative ARMD -- + VMT with central SRHM (vitelliform lesion) and mild interval increase in cystic changes; persistent peripapillary SRHM -- small--interval improvement in pocket of SRF  Clinical management:  See below  Abbreviations: NFP - Normal foveal profile. CME - cystoid macular edema. PED - pigment epithelial detachment. IRF - intraretinal fluid. SRF - subretinal fluid. EZ - ellipsoid zone. ERM - epiretinal membrane. ORA - outer retinal atrophy. ORT - outer retinal tubulation. SRHM - subretinal hyper-reflective material      Intravitreal Injection, Pharmacologic Agent - OS - Left Eye       Time Out 12/11/2020. 9:49 AM. Confirmed correct patient, procedure, site, and patient consented.   Anesthesia Topical anesthesia was used. Anesthetic medications included Lidocaine 2%, Proparacaine 0.5%.   Procedure Preparation included 5% betadine to ocular surface, eyelid speculum. A (32g) needle was used.   Injection: 2 mg aflibercept 2 MG/0.05ML   Route: Intravitreal, Site: Left Eye   NDC: A3590391, Lot: [id number], Expiration date: 10/21/2021, Waste: 0.05 mL   Post-op Post injection exam found visual acuity of at least counting fingers. The patient tolerated the procedure well. There were no complications. The patient received written and verbal post procedure care education. Post injection medications were not given.            ASSESSMENT/PLAN:   ICD-10-CM   1. Exudative age-related macular degeneration of both eyes with active choroidal neovascularization (HCC)  H35.3231 Intravitreal Injection, Pharmacologic Agent - OS - Left Eye    aflibercept (EYLEA) SOLN 2 mg    2. Retinal edema  H35.81 OCT, Retina - OU - Both Eyes    3. Vitreomacular adhesion of both eyes  H43.823     4. Essential hypertension  I10     5. Hypertensive retinopathy of both eyes  H35.033  6. Pseudophakia of both eyes  Z96.1      1,2. Exudative age related macular degeneration, both eyes  -  OD incidental discovery of peripapillary CNV w/ heme and SRF during emergency visit for corneal abrasion OS  - OS Interval conversion of Exudative ARMD noted on 09.22.22 visit (peripapillary CNV w/ SRF) OD:             - S/P IVA OD #1 (03.22.21), #2 (04.19.21), #3 (05.26.21), #4 (06.23.21) -- IVA resistance  - S/P IVE OD #1 sample (07.22.21), #2 (08.24.21), #3 (10.13.21), #4 (11.18.21), #5 (12.30.21), #6 (02.24.22), #7 (04.21.22), #8 (06.30.22), #9 (09.22.22)  - BCVA stable at 20/70   - OCT OD shows stable improvement in peripapillary IRF/SRF/IRHM at 4 wks  - recommend no treatment today -- maintenance w/ f/u in 12-14 wks OS: - s/p IVE OS #1 (09.22.22) - OCT OS shows persistent peripapillary SRHM (small)--interval improvement in pocket of SRF - BCVA 20/40 OS - recommend IVE OS #2 today, 10.21.22  - pt wishes to be treated with IVE OS  - RBA of procedure discussed, questions answered  - IVE informed consent obtained, signed and scanned, 09.22.22 OU  - Avastin informed consent obtained, signed and scanned, 03.22.21  - see procedure note  - [email or web address] benefits investigation started 07.22.21 -- approved for 2022  - f/u in 4 weeks -- DFE, OCT, possible injection OS  3. VMT OU  - mild -- OCT stable OD; OS with central SRHM (vitelliform lesion) and mild interval increase in cystic changes  - review of OCTs since initial visit shows progressive traction -- hopefully will release uneventfully soon  - monitor   4,5. Hypertensive retinopathy OU  - discussed importance of tight BP control  - monitor   6. Pseudophakia OU  - s/p CE/IOL OU  - IOLs in good position, doing well  - monitor   Ophthalmic Meds Ordered this visit:  Meds ordered this encounter  Medications   aflibercept (EYLEA) SOLN 2 mg     Return in about 4 weeks (around 01/08/2021) for 4 wk f/u for exu ARMD OS w/DFE/OCT/likely inj. OS.  There are no Patient Instructions on file for this visit.  Explained the diagnoses, plan,  and follow up with the patient and they expressed understanding.  Patient expressed understanding of the importance of proper follow up care.   This document serves as a record of services personally performed by Gardiner Sleeper, MD, PhD. It was created on their behalf by Leonie Douglas, an ophthalmic technician. The creation of this record is the provider's dictation and/or activities during the visit.    Electronically signed by: Leonie Douglas COA, 12/11/20  12:02 PM  Gardiner Sleeper, M.D., Ph.D. Diseases & Surgery of the Retina and Vitreous [facility] 12/11/2020  I have reviewed the above documentation for accuracy and completeness, and I agree with the above. Gardiner Sleeper, M.D., Ph.D. 12/11/20 12:02 PM  Abbreviations: M myopia (nearsighted); A astigmatism; H hyperopia (farsighted); P presbyopia; Mrx spectacle prescription;  CTL contact lenses; OD right eye; OS left eye; OU both eyes  XT exotropia; ET esotropia; PEK punctate epithelial keratitis; PEE punctate epithelial erosions; DES dry eye syndrome; MGD meibomian gland dysfunction; ATs artificial tears; PFAT's preservative free artificial tears; [place] nuclear sclerotic cataract; PSC posterior subcapsular cataract; ERM epi-retinal membrane; PVD posterior vitreous detachment; RD retinal detachment; DM diabetes mellitus; DR diabetic retinopathy; NPDR non-proliferative diabetic retinopathy; PDR proliferative diabetic retinopathy; CSME clinically significant macular edema;  DME diabetic macular edema; dbh dot blot hemorrhages; CWS cotton wool spot; POAG primary open angle glaucoma; C/D cup-to-disc ratio; HVF humphrey visual field; GVF goldmann visual field; OCT optical coherence tomography; IOP intraocular pressure; BRVO Branch retinal vein occlusion; CRVO central retinal vein occlusion; CRAO central retinal artery occlusion; BRAO branch retinal artery occlusion; RT retinal tear; SB scleral buckle; PPV pars plana vitrectomy; VH  Vitreous hemorrhage; PRP panretinal laser photocoagulation; IVK intravitreal kenalog; VMT vitreomacular traction; MH Macular hole;  NVD neovascularization of the disc; NVE neovascularization elsewhere; AREDS age related eye disease study; ARMD age related macular degeneration; POAG primary open angle glaucoma; EBMD epithelial/anterior basement membrane dystrophy; ACIOL anterior chamber intraocular lens; IOL intraocular lens; PCIOL posterior chamber intraocular lens; Phaco/IOL phacoemulsification with intraocular lens placement; [place] photorefractive keratectomy; LASIK laser assisted in situ keratomileusis; HTN hypertension; DM diabetes mellitus; COPD chronic obstructive pulmonary disease

## 2020-12-10 ENCOUNTER — Encounter (INDEPENDENT_AMBULATORY_CARE_PROVIDER_SITE_OTHER): Payer: Medicare Other | Admitting: Ophthalmology

## 2020-12-10 DIAGNOSIS — H35033 Hypertensive retinopathy, bilateral: Secondary | ICD-10-CM

## 2020-12-10 DIAGNOSIS — Z961 Presence of intraocular lens: Secondary | ICD-10-CM

## 2020-12-10 DIAGNOSIS — H3581 Retinal edema: Secondary | ICD-10-CM

## 2020-12-10 DIAGNOSIS — I10 Essential (primary) hypertension: Secondary | ICD-10-CM

## 2020-12-10 DIAGNOSIS — H353231 Exudative age-related macular degeneration, bilateral, with active choroidal neovascularization: Secondary | ICD-10-CM

## 2020-12-10 DIAGNOSIS — H43823 Vitreomacular adhesion, bilateral: Secondary | ICD-10-CM

## 2020-12-11 ENCOUNTER — Ambulatory Visit (INDEPENDENT_AMBULATORY_CARE_PROVIDER_SITE_OTHER): Payer: Medicare Other | Admitting: Ophthalmology

## 2020-12-11 ENCOUNTER — Other Ambulatory Visit: Payer: Self-pay

## 2020-12-11 ENCOUNTER — Encounter (INDEPENDENT_AMBULATORY_CARE_PROVIDER_SITE_OTHER): Payer: Self-pay | Admitting: Ophthalmology

## 2020-12-11 DIAGNOSIS — H353231 Exudative age-related macular degeneration, bilateral, with active choroidal neovascularization: Secondary | ICD-10-CM

## 2020-12-11 DIAGNOSIS — H43823 Vitreomacular adhesion, bilateral: Secondary | ICD-10-CM | POA: Diagnosis not present

## 2020-12-11 DIAGNOSIS — I10 Essential (primary) hypertension: Secondary | ICD-10-CM | POA: Diagnosis not present

## 2020-12-11 DIAGNOSIS — Z961 Presence of intraocular lens: Secondary | ICD-10-CM

## 2020-12-11 DIAGNOSIS — H35033 Hypertensive retinopathy, bilateral: Secondary | ICD-10-CM | POA: Diagnosis not present

## 2020-12-11 DIAGNOSIS — H3581 Retinal edema: Secondary | ICD-10-CM

## 2020-12-11 MED ORDER — AFLIBERCEPT 2MG/0.05ML IZ SOLN FOR KALEIDOSCOPE
2.0000 mg | INTRAVITREAL | Status: AC | PRN
  Administered 2020-12-11: 2 mg via INTRAVITREAL

## 2021-01-07 NOTE — Progress Notes (Signed)
[facility] Note  01/12/2021     CHIEF COMPLAINT Patient presents for Retina Follow Up   HISTORY OF PRESENT ILLNESS: Eddie Kelly is a 87 y.o. male who presents to the clinic today for:   HPI     Retina Follow Up   Patient presents with  Wet AMD.  In left eye.  This started 4 weeks ago.  I, the attending physician,  performed the HPI with the patient and updated documentation appropriately.        Comments   Patient here for 4 weeks retina follow up for exu ARMD OS. Patient states vision  a little worse than it was. Uses magnifying glass, wasn't using before. No eye pain. In am has a little stickiness in eye may be because of drops. Later after up it is ok.       Last edited by Bernarda Caffey, MD on 01/12/2021 10:06 AM.    Pt states he is "hanging in there", he states occasionally sees a hair-like floater   Referring physician: Warden Fillers, [address] [place],  [place] [id number]  HISTORICAL INFORMATION:   Selected notes from the MEDICAL RECORD NUMBER Referred by Dr. Shirleen Schirmer for concern of CNVM OD LEE: 05/04/2019 BCVA: 20/70-1            20/50+1   CURRENT MEDICATIONS: No current outpatient medications on file. (Ophthalmic Drugs)   No current facility-administered medications for this visit. (Ophthalmic Drugs)   Current Outpatient Medications (Other)  Medication Sig   aspirin EC 81 MG tablet Take 81 mg by mouth daily.   cyanocobalamin (,VITAMIN B-12,) 1000 MCG/ML injection Inject 1,000 mcg into the muscle every 30 (thirty) days.   diclofenac Sodium (VOLTAREN) 1 % GEL Apply 1 application topically 3 (three) times daily as needed for pain.   furosemide (LASIX) 20 MG tablet Take 20 mg by mouth daily.   levothyroxine (SYNTHROID) 100 MCG tablet Take 100 mcg by mouth daily.   loratadine (CLARITIN) 10 MG tablet Take 10 mg by mouth daily.   losartan (COZAAR) 100 MG tablet Take 100 mg by mouth daily.   metoprolol  tartrate (LOPRESSOR) 25 MG tablet Take 25 mg by mouth 2 (two) times daily.   montelukast (SINGULAIR) 10 MG tablet Take 10 mg by mouth at bedtime.   Multiple Vitamin (MULTIVITAMIN WITH MINERALS) TABS tablet Take 1 tablet by mouth daily.   nitrofurantoin, macrocrystal-monohydrate, (MACROBID) 100 MG capsule Take 100 mg by mouth daily.   nitroGLYCERIN (NITROSTAT) 0.4 MG SL tablet Place 0.4 mg under the tongue every 5 (five) minutes as needed for chest pain.   tamsulosin (FLOMAX) 0.4 MG CAPS capsule Take 1 capsule by mouth daily.   No current facility-administered medications for this visit. (Other)   REVIEW OF SYSTEMS: ROS   Positive for: Genitourinary, Endocrine, Cardiovascular, Eyes, Psychiatric Negative for: Constitutional, Gastrointestinal, Neurological, Skin, Musculoskeletal, HENT, Respiratory, Allergic/Imm, Heme/Lymph Last edited by Theodore Demark, COA on 01/12/2021  8:30 AM.     ALLERGIES Allergies  Allergen Reactions   Ciprofloxacin Other (See Comments)    Edema, generalized   Levofloxacin    PAST MEDICAL HISTORY Past Medical History:  Diagnosis Date   A-fib [facility])    Anxiety and depression    Aspiration pneumonia ([place]) 06/27/2019   CAD (coronary artery disease)    Chronic atrial fibrillation ([place]) 09/06/2019   Epistaxis 06/27/2019   Essential hypertension 04/12/2018   Hypertension 09/11/2019   Hypertensive retinopathy  OU   Hypothyroidism    Left inguinal hernia 08/10/2017   Macular degeneration    Wet OD; Dry OS   Permanent atrial fibrillation ([place]) 01/14/2019   Postoperative examination 11/29/2017   Preoperative cardiovascular examination 09/26/2017   Presence of Watchman left atrial appendage closure device 02/04/2020   PVC (premature ventricular contraction)    Sinus bradycardia    Vitamin D deficiency    Past Surgical History:  Procedure Laterality Date   CATARACT EXTRACTION Bilateral    EYE SURGERY Bilateral    Cat Sx   HERNIA REPAIR     FAMILY  HISTORY Family History  Problem Relation Age of Onset   Cancer Father    Heart attack Father    Heart disease Brother    Diabetes Brother    SOCIAL HISTORY Social History   Tobacco Use   Smoking status: Former    Types: Cigarettes    Quit date: 1983    Years since quitting: 39.9   Smokeless tobacco: Never  Vaping Use   Vaping Use: Never used  Substance Use Topics   Alcohol use: Yes    Alcohol/week: 1.0 standard drink    Types: 1 Cans of beer per week    Comment: "every once in a blue moon"   Drug use: No       OPHTHALMIC EXAM: Base Eye Exam     Visual Acuity (Snellen - Linear)       Right Left   Dist cc 20/100 +2 20/40 +1   Dist ph cc 20/70 -2 20/30 -2    Correction: Glasses         Tonometry (Tonopen, 8:25 AM)       Right Left   Pressure 11 10         Pupils       Dark Light Shape React APD   Right 4 4 Round NR None   Left 2 1.5 Round Minimal None         Visual Fields (Counting fingers)       Left Right    Full Full         Extraocular Movement       Right Left    Full, Ortho Full, Ortho         Neuro/Psych     Oriented x3: Yes   Mood/Affect: Normal         Dilation     Both eyes: 1.0% Mydriacyl, 2.5% Phenylephrine @ 8:25 AM           Slit Lamp and Fundus Exam     Slit Lamp Exam       Right Left   Lids/Lashes Dermatochalasis - upper lid Dermatochalasis - upper lid   Conjunctiva/Sclera White and quiet White and quiet   Cornea 2+ Punctate epithelial erosions, crocodile shegreen, well healed temporal cataract wounds, dry tear film 2+ Punctate epithelial erosions, crocodile shegreen, well healed temporal cataract wounds, dry tear film   Anterior Chamber Deep and quiet Deep and quiet   Iris Round and moderately dilated Round and poorly dilated to 4.83m   Lens PC IOL in good position with open PC PC IOL in good position with open PC   Anterior Vitreous Vitreous syneresis Vitreous syneresis         Fundus Exam        Right Left   Disc mild Pallor, Sharp rim, peripapillary CNV with +exudate ST disc -- improved, no heme mild Pallor, Sharp rim; peripapillary CNV with  small pocket of SRF-improved   C/D Ratio 0.4 0.4   Macula Flat, Blunted foveal reflex, +peripapillary heme (resolved) and  exudate -- improving, edema - stably improved, central VMT with cystic changes - improved Flat, Blunted foveal reflex, fine Drusen, mild Retinal pigment epithelial mottling, no heme, focal edema nasal mac--improved, peripapillary CNV with shallow SRF stably resolved   Vessels Vascular attenuation, mild Tortuousity, perivascular exudation along superior venule -- persistent Vascular attenuation, mild Tortuousity   Periphery Attached, mild RPE changes, No heme  Attached, no heme, mild RPE changes             Refraction     Wearing Rx       Sphere Cylinder Axis Add   Right -3.00 +3.00 005 +2.75   Left -1.50 +3.00 167 +2.75            IMAGING AND PROCEDURES  Imaging and Procedures for _0 @  OCT, Retina - OU - Both Eyes       Right Eye Quality was borderline. Central Foveal Thickness: 357. Progression has been stable. Findings include abnormal foveal contour, vitreous traction, epiretinal membrane, intraretinal hyper-reflective material, subretinal hyper-reflective material, outer retinal atrophy, pigment epithelial detachment, retinal drusen , no SRF, intraretinal fluid (Persistent central VMT; stable improvement in peripapillary CNV and IRF).   Left Eye Quality was good. Central Foveal Thickness: 354. Progression has been stable. Findings include vitreous traction, subretinal hyper-reflective material, retinal drusen , intraretinal fluid, abnormal foveal contour, subretinal fluid (+VMT with persistent cystic changes; central SRHM (vitelliform lesion); interval improvement in peripapillary [facility]).   Notes *Images captured and stored on drive  Diagnosis / Impression:  OD: exudative ARMD w/ peripapillary  CNVM -- stable improvement in peripapillary CNV and IRF; stable central VMT OS: exudative ARMD -- +VMT with persistent cystic changes; central SRHM (vitelliform lesion); interval improvement in peripapillary [facility]  Clinical management:  See below  Abbreviations: NFP - Normal foveal profile. CME - cystoid macular edema. PED - pigment epithelial detachment. IRF - intraretinal fluid. SRF - subretinal fluid. EZ - ellipsoid zone. ERM - epiretinal membrane. ORA - outer retinal atrophy. ORT - outer retinal tubulation. SRHM - subretinal hyper-reflective material      Intravitreal Injection, Pharmacologic Agent - OS - Left Eye       Time Out 01/12/2021. 8:57 AM. Confirmed correct patient, procedure, site, and patient consented.   Anesthesia Topical anesthesia was used. Anesthetic medications included Lidocaine 2%, Proparacaine 0.5%.   Procedure Preparation included 5% betadine to ocular surface, eyelid speculum. A supplied needle was used.   Injection: 1.25 mg Bevacizumab 1.47m/0.05ml   Route: Intravitreal, Site: Left Eye   NDC:: 70623-762-83 Lot: [id number] , Expiration date: 03/02/2021, Waste: 0 mL   Post-op Post injection exam found visual acuity of at least counting fingers. The patient tolerated the procedure well. There were no complications. The patient received written and verbal post procedure care education. Post injection medications were not given.             ASSESSMENT/PLAN:   ICD-10-CM   1. Exudative age-related macular degeneration of both eyes with active choroidal neovascularization (HCC)  H35.3231 Intravitreal Injection, Pharmacologic Agent - OS - Left Eye    Bevacizumab (AVASTIN) SOLN 1.25 mg    2. Retinal edema  H35.81 OCT, Retina - OU - Both Eyes    3. Vitreomacular adhesion of both eyes  H43.823     4. Essential hypertension  I10     5. Hypertensive retinopathy of both eyes  H35.033  6. Pseudophakia of both eyes  Z96.1       1,2. Exudative age  related macular degeneration, both eyes  - OD incidental discovery of peripapillary CNV w/ heme and SRF during emergency visit for corneal abrasion OS  - OS Interval conversion of Exudative ARMD noted on 09.22.22 visit (peripapillary CNV w/ SRF) OD:             - S/P IVA OD #1 (03.22.21), #2 (04.19.21), #3 (05.26.21), #4 (06.23.21) -- IVA resistance  - S/P IVE OD #1 sample (07.22.21), #2 (08.24.21), #3 (10.13.21), #4 (11.18.21), #5 (12.30.21), #6 (02.24.22), #7 (04.21.22), #8 (06.30.22), #9 (09.22.22)  - BCVA stable at 20/70   - OCT OD shows stable improvement in peripapillary IRF/SRF/IRHM at 8 wks  - recommend no treatment today -- on q12-14 wk maintenance scheduled -- due Dec 2021 OS: - s/p IVE OS #1 (09.22.22), #2 (10.21.22) - OCT OS shows interval improvement in peripapillary SRHM  - BCVA 20/30 OS - recommend IVA OS #3 today, 11.22.22  - pt wishes to be treated with IVA OS  - RBA of procedure discussed, questions answered  - IVE informed consent obtained, signed and scanned, 09.22.22 OU  - Avastin informed consent obtained, signed and scanned, 03.22.21  - see procedure note  - [employer] benefits investigation started 07.22.21 -- approved for 2022  - f/u in 4 weeks -- DFE, OCT, possible injection OU  3. VMT OU  - mild -- OCT stable OD; OS with central SRHM (vitelliform lesion) and mild interval increase in cystic changes  - review of OCTs since initial visit shows progressive traction -- hopefully will release uneventfully soon  - monitor   4,5. Hypertensive retinopathy OU  - discussed importance of tight BP control  - monitor   6. Pseudophakia OU  - s/p CE/IOL OU  - IOLs in good position, doing well  - monitor   Ophthalmic Meds Ordered this visit:  Meds ordered this encounter  Medications   Bevacizumab (AVASTIN) SOLN 1.25 mg     Return in about 4 weeks (around 02/09/2021) for f/u exu ARMD OU, DFE, OCT.  There are no Patient Instructions on file for this  visit.  Explained the diagnoses, plan, and follow up with the patient and they expressed understanding.  Patient expressed understanding of the importance of proper follow up care.   This document serves as a record of services personally performed by Gardiner Sleeper, MD, PhD. It was created on their behalf by Estill Bakes, COT an ophthalmic technician. The creation of this record is the provider's dictation and/or activities during the visit.    Electronically signed by: Estill Bakes, COT 11.17.22 @ 10:15 AM   This document serves as a record of services personally performed by Gardiner Sleeper, MD, PhD. It was created on their behalf by San Jetty. Owens Shark, OA an ophthalmic technician. The creation of this record is the provider's dictation and/or activities during the visit.    Electronically signed by: San Jetty. Owens Shark, [place] 11.22.2022 10:15 AM  Gardiner Sleeper, M.D., Ph.D. Diseases & Surgery of the Retina and Vitreous [facility]  I have reviewed the above documentation for accuracy and completeness, and I agree with the above. Gardiner Sleeper, M.D., Ph.D. 01/12/21 10:15 AM   Abbreviations: M myopia (nearsighted); A astigmatism; H hyperopia (farsighted); P presbyopia; Mrx spectacle prescription;  CTL contact lenses; OD right eye; OS left eye; OU both eyes  XT exotropia; ET esotropia; PEK punctate  epithelial keratitis; PEE punctate epithelial erosions; DES dry eye syndrome; MGD meibomian gland dysfunction; ATs artificial tears; PFAT's preservative free artificial tears; [place] nuclear sclerotic cataract; PSC posterior subcapsular cataract; ERM epi-retinal membrane; PVD posterior vitreous detachment; RD retinal detachment; DM diabetes mellitus; DR diabetic retinopathy; NPDR non-proliferative diabetic retinopathy; PDR proliferative diabetic retinopathy; CSME clinically significant macular edema; DME diabetic macular edema; dbh dot blot hemorrhages; CWS cotton wool spot; POAG  primary open angle glaucoma; C/D cup-to-disc ratio; HVF humphrey visual field; GVF goldmann visual field; OCT optical coherence tomography; IOP intraocular pressure; BRVO Branch retinal vein occlusion; CRVO central retinal vein occlusion; CRAO central retinal artery occlusion; BRAO branch retinal artery occlusion; RT retinal tear; SB scleral buckle; PPV pars plana vitrectomy; VH Vitreous hemorrhage; PRP panretinal laser photocoagulation; IVK intravitreal kenalog; VMT vitreomacular traction; MH Macular hole;  NVD neovascularization of the disc; NVE neovascularization elsewhere; AREDS age related eye disease study; ARMD age related macular degeneration; POAG primary open angle glaucoma; EBMD epithelial/anterior basement membrane dystrophy; ACIOL anterior chamber intraocular lens; IOL intraocular lens; PCIOL posterior chamber intraocular lens; Phaco/IOL phacoemulsification with intraocular lens placement; [place] photorefractive keratectomy; LASIK laser assisted in situ keratomileusis; HTN hypertension; DM diabetes mellitus; COPD chronic obstructive pulmonary disease

## 2021-01-12 ENCOUNTER — Other Ambulatory Visit: Payer: Self-pay

## 2021-01-12 ENCOUNTER — Encounter (INDEPENDENT_AMBULATORY_CARE_PROVIDER_SITE_OTHER): Payer: Self-pay | Admitting: Ophthalmology

## 2021-01-12 ENCOUNTER — Ambulatory Visit (INDEPENDENT_AMBULATORY_CARE_PROVIDER_SITE_OTHER): Payer: Medicare Other | Admitting: Ophthalmology

## 2021-01-12 DIAGNOSIS — H35033 Hypertensive retinopathy, bilateral: Secondary | ICD-10-CM

## 2021-01-12 DIAGNOSIS — H43823 Vitreomacular adhesion, bilateral: Secondary | ICD-10-CM

## 2021-01-12 DIAGNOSIS — Z961 Presence of intraocular lens: Secondary | ICD-10-CM

## 2021-01-12 DIAGNOSIS — H3581 Retinal edema: Secondary | ICD-10-CM

## 2021-01-12 DIAGNOSIS — I10 Essential (primary) hypertension: Secondary | ICD-10-CM | POA: Diagnosis not present

## 2021-01-12 DIAGNOSIS — H353231 Exudative age-related macular degeneration, bilateral, with active choroidal neovascularization: Secondary | ICD-10-CM

## 2021-01-12 MED ORDER — BEVACIZUMAB CHEMO INJECTION 1.25MG/0.05ML SYRINGE FOR KALEIDOSCOPE
1.2500 mg | INTRAVITREAL | Status: AC | PRN
  Administered 2021-01-12: 1.25 mg via INTRAVITREAL

## 2021-01-28 NOTE — Progress Notes (Signed)
[facility] Note  02/09/2021     CHIEF COMPLAINT Patient presents for Retina Follow Up   HISTORY OF PRESENT ILLNESS: Eddie Kelly is a 87 y.o. male who presents to the clinic today for:   HPI     Retina Follow Up   Patient presents with  Wet AMD.  In both eyes.  Duration of 4 weeks.  Since onset it is stable.  I, the attending physician,  performed the HPI with the patient and updated documentation appropriately.        Comments   4 week follow up Exu ARMD OU- After his last injection, his eye stayed sore for a longer amount of time than usual.  O/w no changes.      Last edited by Bernarda Caffey, MD on 02/09/2021  9:22 AM.      Referring physician: Warden Fillers, MD [address] [place],  [place] [id number]  HISTORICAL INFORMATION:   Selected notes from the MEDICAL RECORD NUMBER Referred by Dr. Shirleen Schirmer for concern of CNVM OD LEE: 05/04/2019 BCVA: 20/70-1            20/50+1   CURRENT MEDICATIONS: No current outpatient medications on file. (Ophthalmic Drugs)   No current facility-administered medications for this visit. (Ophthalmic Drugs)   Current Outpatient Medications (Other)  Medication Sig   aspirin EC 81 MG tablet Take 81 mg by mouth daily.   cyanocobalamin (,VITAMIN B-12,) 1000 MCG/ML injection Inject 1,000 mcg into the muscle every 30 (thirty) days.   diclofenac Sodium (VOLTAREN) 1 % GEL Apply 1 application topically 3 (three) times daily as needed for pain.   furosemide (LASIX) 20 MG tablet Take 20 mg by mouth daily.   levothyroxine (SYNTHROID) 100 MCG tablet Take 100 mcg by mouth daily.   loratadine (CLARITIN) 10 MG tablet Take 10 mg by mouth daily.   losartan (COZAAR) 100 MG tablet Take 100 mg by mouth daily.   metoprolol tartrate (LOPRESSOR) 25 MG tablet Take 25 mg by mouth 2 (two) times daily.   montelukast (SINGULAIR) 10 MG tablet Take 10 mg by mouth at bedtime.   Multiple Vitamin (MULTIVITAMIN WITH  MINERALS) TABS tablet Take 1 tablet by mouth daily.   nitrofurantoin, macrocrystal-monohydrate, (MACROBID) 100 MG capsule Take 100 mg by mouth daily.   nitroGLYCERIN (NITROSTAT) 0.4 MG SL tablet Place 0.4 mg under the tongue every 5 (five) minutes as needed for chest pain.   tamsulosin (FLOMAX) 0.4 MG CAPS capsule Take 1 capsule by mouth daily.   No current facility-administered medications for this visit. (Other)   REVIEW OF SYSTEMS: ROS   Positive for: Genitourinary, Endocrine, Cardiovascular, Eyes, Psychiatric Negative for: Constitutional, Gastrointestinal, Neurological, Skin, Musculoskeletal, HENT, Respiratory, Allergic/Imm, Heme/Lymph Last edited by Leonie Douglas, COA on 02/09/2021  8:21 AM.     ALLERGIES Allergies  Allergen Reactions   Ciprofloxacin Other (See Comments)    Edema, generalized   Levofloxacin    PAST MEDICAL HISTORY Past Medical History:  Diagnosis Date   A-fib [facility])    Anxiety and depression    Aspiration pneumonia ([place]) 06/27/2019   CAD (coronary artery disease)    Chronic atrial fibrillation ([place]) 09/06/2019   Epistaxis 06/27/2019   Essential hypertension 04/12/2018   Hypertension 09/11/2019   Hypertensive retinopathy    OU   Hypothyroidism    Left inguinal hernia 08/10/2017   Macular degeneration    Wet OD; Dry OS   Permanent atrial fibrillation ([place]) 01/14/2019  Postoperative examination 11/29/2017   Preoperative cardiovascular examination 09/26/2017   Presence of Watchman left atrial appendage closure device 02/04/2020   PVC (premature ventricular contraction)    Sinus bradycardia    Vitamin D deficiency    Past Surgical History:  Procedure Laterality Date   CATARACT EXTRACTION Bilateral    EYE SURGERY Bilateral    Cat Sx   HERNIA REPAIR     FAMILY HISTORY Family History  Problem Relation Age of Onset   Cancer Father    Heart attack Father    Heart disease Brother    Diabetes Brother    SOCIAL HISTORY Social History   Tobacco Use    Smoking status: Former    Types: Cigarettes    Quit date: 1983    Years since quitting: 39.9   Smokeless tobacco: Never  Vaping Use   Vaping Use: Never used  Substance Use Topics   Alcohol use: Yes    Alcohol/week: 1.0 standard drink    Types: 1 Cans of beer per week    Comment: "every once in a blue moon"   Drug use: No       OPHTHALMIC EXAM: Base Eye Exam     Visual Acuity (Snellen - Linear)       Right Left   Dist cc 20/80 +2 20/50 -2   Dist ph cc 20/70 -2 20/50    Correction: Glasses         Tonometry (Tonopen, 8:31 AM)       Right Left   Pressure 11 11         Pupils       Dark Light Shape React APD   Right 4 4 Round NR None   Left 2 1.5 Round Minimal None         Visual Fields (Counting fingers)       Left Right    Full Full         Extraocular Movement       Right Left    Full Full         Neuro/Psych     Oriented x3: Yes   Mood/Affect: Normal         Dilation     Both eyes: 1.0% Mydriacyl, 2.5% Phenylephrine @ 8:31 AM           Slit Lamp and Fundus Exam     Slit Lamp Exam       Right Left   Lids/Lashes Dermatochalasis - upper lid Dermatochalasis - upper lid   Conjunctiva/Sclera White and quiet White and quiet   Cornea 2+ Punctate epithelial erosions, crocodile shegreen, well healed temporal cataract wounds, dry tear film 2-3+ Punctate epithelial erosions, crocodile shegreen, well healed temporal cataract wounds, dry tear film, tear film debris   Anterior Chamber Deep and quiet Deep and quiet   Iris Round and moderately dilated Round and poorly dilated to 4.36m   Lens PC IOL in good position with open PC PC IOL in good position with open PC   Anterior Vitreous Vitreous syneresis Vitreous syneresis         Fundus Exam       Right Left   Disc mild Pallor, Sharp rim, peripapillary CNV with +exudate ST disc -- improved, no heme mild Pallor, Sharp rim; peripapillary CNV with small pocket of SRF-improved   C/D Ratio  0.4 0.4   Macula Flat, Blunted foveal reflex, +peripapillary heme (resolved) and  exudate -- stably improved, edema - stably improved, central  VMT with cystic changes - improved Flat, Blunted foveal reflex, fine Drusen, mild Retinal pigment epithelial mottling, no heme, focal edema nasal mac--improved, peripapillary CNV with shallow SRF stably resolved   Vessels Vascular attenuation, mild Tortuousity, perivascular exudation along superior venule -- persistent Vascular attenuation, mild Tortuousity   Periphery Attached, mild RPE changes, No heme  Attached, no heme, mild RPE changes             Refraction     Wearing Rx       Sphere Cylinder Axis Add   Right -3.00 +3.00 005 +2.75   Left -1.50 +3.00 167 +2.75         Manifest Refraction       Sphere Cylinder Axis Dist VA   Right       Left -2.00 +2.25 165 20/50            IMAGING AND PROCEDURES  Imaging and Procedures for _0 @  OCT, Retina - OU - Both Eyes       Right Eye Quality was good. Central Foveal Thickness: 362. Progression has been stable. Findings include abnormal foveal contour, vitreous traction, epiretinal membrane, intraretinal hyper-reflective material, subretinal hyper-reflective material, outer retinal atrophy, pigment epithelial detachment, retinal drusen , no SRF, intraretinal fluid (Persistent central VMT; stable improvement in peripapillary CNV and IRF).   Left Eye Quality was good. Central Foveal Thickness: 340. Progression has been stable. Findings include vitreous traction, subretinal hyper-reflective material, retinal drusen , intraretinal fluid, abnormal foveal contour, no SRF (+VMT with persistent cystic changes; central SRHM (vitelliform lesion); stable improvement in peripapillary [facility]).   Notes *Images captured and stored on drive  Diagnosis / Impression:  OD: exudative ARMD w/ peripapillary CNVM -- Persistent central VMT; stable improvement in peripapillary CNV and IRF OS: exudative  ARMD -- +VMT with persistent cystic changes; central SRHM (vitelliform lesion); stable improvement in peripapillary [facility]  Clinical management:  See below  Abbreviations: NFP - Normal foveal profile. CME - cystoid macular edema. PED - pigment epithelial detachment. IRF - intraretinal fluid. SRF - subretinal fluid. EZ - ellipsoid zone. ERM - epiretinal membrane. ORA - outer retinal atrophy. ORT - outer retinal tubulation. SRHM - subretinal hyper-reflective material      Intravitreal Injection, Pharmacologic Agent - OD - Right Eye       Time Out 02/09/2021. 8:57 AM. Confirmed correct patient, procedure, site, and patient consented.   Anesthesia Topical anesthesia was used. Anesthetic medications included Lidocaine 2%, Proparacaine 0.5%.   Procedure Preparation included 5% betadine to ocular surface, eyelid speculum. A (32g) needle was used.   Injection: 2 mg aflibercept 2 MG/0.05ML   Route: Intravitreal, Site: Right Eye   NDC: A3590391, Lot: [id number], Expiration date: 12/22/2021, Waste: 0.05 mL   Post-op Post injection exam found visual acuity of at least counting fingers. The patient tolerated the procedure well. There were no complications. The patient received written and verbal post procedure care education. Post injection medications were not given.      Intravitreal Injection, Pharmacologic Agent - OS - Left Eye       Time Out 02/09/2021. 8:57 AM. Confirmed correct patient, procedure, site, and patient consented.   Anesthesia Topical anesthesia was used. Anesthetic medications included Lidocaine 2%, Proparacaine 0.5%.   Procedure Preparation included 5% betadine to ocular surface, eyelid speculum. A supplied needle was used.   Injection: 2 mg aflibercept 2 MG/0.05ML   Route: Intravitreal, Site: Left Eye   NDC: A3590391, Lot: [id number], Expiration date: 11/21/2021, Waste: 0.05 mL  Post-op Post injection exam found visual acuity of at least counting  fingers. The patient tolerated the procedure well. There were no complications. The patient received written and verbal post procedure care education. Post injection medications were not given.            ASSESSMENT/PLAN:   ICD-10-CM   1. Exudative age-related macular degeneration of both eyes with active choroidal neovascularization (HCC)  H35.3231 OCT, Retina - OU - Both Eyes    Intravitreal Injection, Pharmacologic Agent - OD - Right Eye    Intravitreal Injection, Pharmacologic Agent - OS - Left Eye    aflibercept (EYLEA) SOLN 2 mg    aflibercept (EYLEA) SOLN 2 mg    2. Vitreomacular adhesion of both eyes  H43.823     3. Essential hypertension  I10     4. Hypertensive retinopathy of both eyes  H35.033     5. Pseudophakia of both eyes  Z96.1      1. Exudative age related macular degeneration, both eyes  - OD incidental discovery of peripapillary CNV w/ heme and SRF during emergency visit for corneal abrasion OS  - OS Interval conversion of Exudative ARMD noted on 09.22.22 visit (peripapillary CNV w/ SRF) OD:             - S/P IVA OD #1 (03.22.21), #2 (04.19.21), #3 (05.26.21), #4 (06.23.21) -- IVA resistance  - S/P IVE OD #1 sample (07.22.21), #2 (08.24.21), #3 (10.13.21), #4 (11.18.21), #5 (12.30.21), #6 (02.24.22), #7 (04.21.22), #8 (06.30.22), #9 (09.22.22)  - BCVA OD stable at 20/70; OS decreased to 20/50 from 20/30  - OCT OD: Persistent central VMT; stable improvement in peripapillary CNV and IRF; OS: +VMT with persistent cystic changes; central SRHM (vitelliform lesion); stable improvement in peripapillary SRHM - s/p IVE OS #1 (09.22.22), #2 (10.21.22) - s/p IVA OS #1 (11.22.22) - recommend IVE OU -- OD #10 and OS #3 today, 12.20.22  - pt wishes to proceed with injections  - RBA of procedure discussed, questions answered  - IVE informed consent obtained, signed and scanned, 09.22.22 OU  - Avastin informed consent obtained, signed and scanned, 03.22.21  - see procedure  note  - Eylea4U benefits investigation started 07.22.21 -- approved for 2022  - f/u in 6 weeks -- DFE, OCT, possible injection OS only  2. VMT OU  - mild -- OCT stable OD; OS with central SRHM (vitelliform lesion) and mild interval increase in cystic changes  - review of OCTs since initial visit shows progressive traction -- hopefully will release uneventfully soon  - monitor   3,4. Hypertensive retinopathy OU  - discussed importance of tight BP control  - monitor   5. Pseudophakia OU  - s/p CE/IOL OU  - IOLs in good position, doing well  - monitor   Ophthalmic Meds Ordered this visit:  Meds ordered this encounter  Medications   aflibercept (EYLEA) SOLN 2 mg   aflibercept (EYLEA) SOLN 2 mg     Return in about 6 weeks (around 03/23/2021) for f/u exxu ARMD OU, DFE, OCT.  There are no Patient Instructions on file for this visit.  Explained the diagnoses, plan, and follow up with the patient and they expressed understanding.  Patient expressed understanding of the importance of proper follow up care.   This document serves as a record of services personally performed by Gardiner Sleeper, MD, PhD. It was created on their behalf by Estill Bakes, COT an ophthalmic technician. The creation of this record is the  provider's dictation and/or activities during the visit.    Electronically signed by: Estill Bakes, COT 12.8.22 @ 9:28 AM   This document serves as a record of services personally performed by Gardiner Sleeper, MD, PhD. It was created on their behalf by San Jetty. Owens Shark, OA an ophthalmic technician. The creation of this record is the provider's dictation and/or activities during the visit.    Electronically signed by: San Jetty. Owens Shark, [place] 12.20.2022 9:28 AM   Gardiner Sleeper, M.D., Ph.D. Diseases & Surgery of the Retina and Vitreous [facility]  I have reviewed the above documentation for accuracy and completeness, and I agree with the above. Gardiner Sleeper, M.D., Ph.D. 02/09/21 9:28 AM   Abbreviations: M myopia (nearsighted); A astigmatism; H hyperopia (farsighted); P presbyopia; Mrx spectacle prescription;  CTL contact lenses; OD right eye; OS left eye; OU both eyes  XT exotropia; ET esotropia; PEK punctate epithelial keratitis; PEE punctate epithelial erosions; DES dry eye syndrome; MGD meibomian gland dysfunction; ATs artificial tears; PFAT's preservative free artificial tears; [place] nuclear sclerotic cataract; PSC posterior subcapsular cataract; ERM epi-retinal membrane; PVD posterior vitreous detachment; RD retinal detachment; DM diabetes mellitus; DR diabetic retinopathy; NPDR non-proliferative diabetic retinopathy; PDR proliferative diabetic retinopathy; CSME clinically significant macular edema; DME diabetic macular edema; dbh dot blot hemorrhages; CWS cotton wool spot; POAG primary open angle glaucoma; C/D cup-to-disc ratio; HVF humphrey visual field; GVF goldmann visual field; OCT optical coherence tomography; IOP intraocular pressure; BRVO Branch retinal vein occlusion; CRVO central retinal vein occlusion; CRAO central retinal artery occlusion; BRAO branch retinal artery occlusion; RT retinal tear; SB scleral buckle; PPV pars plana vitrectomy; VH Vitreous hemorrhage; PRP panretinal laser photocoagulation; IVK intravitreal kenalog; VMT vitreomacular traction; MH Macular hole;  NVD neovascularization of the disc; NVE neovascularization elsewhere; AREDS age related eye disease study; ARMD age related macular degeneration; POAG primary open angle glaucoma; EBMD epithelial/anterior basement membrane dystrophy; ACIOL anterior chamber intraocular lens; IOL intraocular lens; PCIOL posterior chamber intraocular lens; Phaco/IOL phacoemulsification with intraocular lens placement; [place] photorefractive keratectomy; LASIK laser assisted in situ keratomileusis; HTN hypertension; DM diabetes mellitus; COPD chronic obstructive pulmonary disease

## 2021-02-09 ENCOUNTER — Other Ambulatory Visit: Payer: Self-pay

## 2021-02-09 ENCOUNTER — Encounter (INDEPENDENT_AMBULATORY_CARE_PROVIDER_SITE_OTHER): Payer: Self-pay | Admitting: Ophthalmology

## 2021-02-09 ENCOUNTER — Ambulatory Visit (INDEPENDENT_AMBULATORY_CARE_PROVIDER_SITE_OTHER): Payer: Medicare Other | Admitting: Ophthalmology

## 2021-02-09 DIAGNOSIS — H35033 Hypertensive retinopathy, bilateral: Secondary | ICD-10-CM | POA: Diagnosis not present

## 2021-02-09 DIAGNOSIS — H353231 Exudative age-related macular degeneration, bilateral, with active choroidal neovascularization: Secondary | ICD-10-CM

## 2021-02-09 DIAGNOSIS — H3581 Retinal edema: Secondary | ICD-10-CM

## 2021-02-09 DIAGNOSIS — H43823 Vitreomacular adhesion, bilateral: Secondary | ICD-10-CM

## 2021-02-09 DIAGNOSIS — Z961 Presence of intraocular lens: Secondary | ICD-10-CM

## 2021-02-09 DIAGNOSIS — I10 Essential (primary) hypertension: Secondary | ICD-10-CM

## 2021-02-09 MED ORDER — AFLIBERCEPT 2MG/0.05ML IZ SOLN FOR KALEIDOSCOPE
2.0000 mg | INTRAVITREAL | Status: AC | PRN
  Administered 2021-02-09: 09:00:00 2 mg via INTRAVITREAL

## 2021-03-17 DIAGNOSIS — R339 Retention of urine, unspecified: Secondary | ICD-10-CM | POA: Diagnosis not present

## 2021-03-22 NOTE — Progress Notes (Signed)
[facility] Note  03/23/2021     CHIEF COMPLAINT Patient presents for Retina Follow Up   HISTORY OF PRESENT ILLNESS: Eddie Kelly is a 88 y.o. male who presents to the clinic today for:   HPI     Retina Follow Up   Patient presents with  Wet AMD.  In both eyes.  This started years ago.  Severity is moderate.  Duration of 6 weeks.  Since onset it is stable.  I, the attending physician,  performed the HPI with the patient and updated documentation appropriately.        Comments   88 y/o male pt here for 6 wk f/u for exu ARMD OU.  No change in [place] OU noticed.  Denies pain, FOL, floaters.  Systane prn OU.      Last edited by Bernarda Caffey, MD on 03/26/2021 12:35 AM.    Pt states vision seems stable, eyes are "gooey" in the morning, he is trying to remember to use AT's, but hasn't gotten into a regular habit yet   Referring physician: Warden Fillers, [address] [place],  [place] [address]  HISTORICAL INFORMATION:   Selected notes from the MEDICAL RECORD NUMBER Referred by Dr. Shirleen Schirmer for concern of CNVM OD LEE: 05/04/2019 BCVA: 20/70-1            20/50+1   CURRENT MEDICATIONS: No current outpatient medications on file. (Ophthalmic Drugs)   No current facility-administered medications for this visit. (Ophthalmic Drugs)   Current Outpatient Medications (Other)  Medication Sig   aspirin EC 81 MG tablet Take 81 mg by mouth daily.   cyanocobalamin (,VITAMIN B-12,) 1000 MCG/ML injection Inject 1,000 mcg into the muscle every 30 (thirty) days.   diclofenac Sodium (VOLTAREN) 1 % GEL Apply 1 application topically 3 (three) times daily as needed for pain.   furosemide (LASIX) 20 MG tablet Take 20 mg by mouth daily.   levothyroxine (SYNTHROID) 100 MCG tablet Take 100 mcg by mouth daily.   loratadine (CLARITIN) 10 MG tablet Take 10 mg by mouth daily.   losartan (COZAAR) 100 MG tablet Take 100 mg by mouth daily.   metoprolol  tartrate (LOPRESSOR) 25 MG tablet Take 25 mg by mouth 2 (two) times daily.   montelukast (SINGULAIR) 10 MG tablet Take 10 mg by mouth at bedtime.   Multiple Vitamin (MULTIVITAMIN WITH MINERALS) TABS tablet Take 1 tablet by mouth daily.   nitrofurantoin, macrocrystal-monohydrate, (MACROBID) 100 MG capsule Take 100 mg by mouth daily.   nitroGLYCERIN (NITROSTAT) 0.4 MG SL tablet Place 0.4 mg under the tongue every 5 (five) minutes as needed for chest pain.   tamsulosin (FLOMAX) 0.4 MG CAPS capsule Take 1 capsule by mouth daily.   No current facility-administered medications for this visit. (Other)   REVIEW OF SYSTEMS: ROS   Positive for: Neurological, Cardiovascular, Eyes Negative for: Constitutional, Gastrointestinal, Skin, Genitourinary, Musculoskeletal, HENT, Endocrine, Respiratory, Psychiatric, Allergic/Imm, Heme/Lymph Last edited by Matthew Folks, COA on 03/23/2021  9:04 AM.      ALLERGIES Allergies  Allergen Reactions   Ciprofloxacin Other (See Comments)    Edema, generalized   Levofloxacin    PAST MEDICAL HISTORY Past Medical History:  Diagnosis Date   A-fib [facility])    Anxiety and depression    Aspiration pneumonia ([place]) 06/27/2019   CAD (coronary artery disease)    Chronic atrial fibrillation ([place]) 09/06/2019   Epistaxis 06/27/2019   Essential hypertension 04/12/2018  Hypertension 09/11/2019   Hypertensive retinopathy    OU   Hypothyroidism    Left inguinal hernia 08/10/2017   Macular degeneration    Wet OD; Dry OS   Permanent atrial fibrillation ([place]) 01/14/2019   Postoperative examination 11/29/2017   Preoperative cardiovascular examination 09/26/2017   Presence of Watchman left atrial appendage closure device 02/04/2020   PVC (premature ventricular contraction)    Sinus bradycardia    Vitamin D deficiency    Past Surgical History:  Procedure Laterality Date   CATARACT EXTRACTION Bilateral    EYE SURGERY Bilateral    Cat Sx   HERNIA REPAIR     FAMILY  HISTORY Family History  Problem Relation Age of Onset   Cancer Father    Heart attack Father    Heart disease Brother    Diabetes Brother    SOCIAL HISTORY Social History   Tobacco Use   Smoking status: Former    Types: Cigarettes    Quit date: 1983    Years since quitting: 40.1   Smokeless tobacco: Never  Vaping Use   Vaping Use: Never used  Substance Use Topics   Alcohol use: Yes    Alcohol/week: 1.0 standard drink    Types: 1 Cans of beer per week    Comment: "every once in a blue moon"   Drug use: No       OPHTHALMIC EXAM: Base Eye Exam     Visual Acuity (Snellen - Linear)       Right Left   Dist cc 20/80 -2 20/25 -2   Dist ph cc NI NI    Correction: Glasses         Tonometry (Tonopen, 9:06 AM)       Right Left   Pressure 9 10         Pupils       Dark Light Shape React APD   Right 4 4 Round NR None   Left 2 1.5 Round Minimal None         Visual Fields (Counting fingers)       Left Right    Full Full         Extraocular Movement       Right Left    Full, Ortho Full, Ortho         Neuro/Psych     Oriented x3: Yes   Mood/Affect: Normal         Dilation     Both eyes: 1.0% Mydriacyl, 2.5% Phenylephrine @ 9:06 AM           Slit Lamp and Fundus Exam     Slit Lamp Exam       Right Left   Lids/Lashes Dermatochalasis - upper lid Dermatochalasis - upper lid   Conjunctiva/Sclera White and quiet White and quiet   Cornea 2+ Punctate epithelial erosions, crocodile shegreen, well healed temporal cataract wounds, dry tear film, irregular epi surface inferiorly 2-3+ Punctate epithelial erosions, crocodile shegreen, well healed temporal cataract wounds, dry tear film, tear film debris, irregular epi surface inferiorly   Anterior Chamber Deep and quiet Deep and quiet   Iris Round and moderately dilated Round and poorly dilated to 4.17m   Lens PC IOL in good position with open PC PC IOL in good position with open PC   Anterior  Vitreous Vitreous syneresis Vitreous syneresis         Fundus Exam       Right Left   Disc mild Pallor, SHervey Ard  rim, peripapillary CNV with +exudate ST disc -- improved, exudate gone, no heme mild Pallor, Sharp rim; peripapillary CNV with small pocket of SRF-improved   C/D Ratio 0.4 0.4   Macula Flat, Blunted foveal reflex, +peripapillary heme (resolved) and  exudate -- stably improved, edema - stably improved, central VMT with cystic changes - improved Flat, Blunted foveal reflex, fine Drusen, mild Retinal pigment epithelial mottling, no heme, focal edema nasal mac--improved, peripapillary CNV with shallow SRF stably resolved   Vessels Vascular attenuation, mild Tortuousity, perivascular exudation along superior venule -- persistent Vascular attenuation, mild Tortuousity   Periphery Attached, mild RPE changes, No heme  Attached, no heme, mild RPE changes              IMAGING AND PROCEDURES  Imaging and Procedures for _0 @  OCT, Retina - OU - Both Eyes       Right Eye Quality was poor. Central Foveal Thickness: 351. Progression has been stable. Findings include abnormal foveal contour, vitreous traction, epiretinal membrane, intraretinal hyper-reflective material, subretinal hyper-reflective material, outer retinal atrophy, pigment epithelial detachment, retinal drusen , no SRF, intraretinal fluid (Persistent central VMT; stable improvement in peripapillary CNV and IRF).   Left Eye Quality was poor. Central Foveal Thickness: 268. Progression has been stable. Findings include vitreous traction, subretinal hyper-reflective material, retinal drusen , intraretinal fluid, abnormal foveal contour, no SRF (+VMT with persistent cystic changes; central SRHM (vitelliform lesion); stable improvement in peripapillary [facility]).   Notes *Images captured and stored on drive  Diagnosis / Impression:  OD: exudative ARMD w/ peripapillary CNVM -- Persistent central VMT; stable improvement in  peripapillary CNV and IRF OS: exudative ARMD -- +VMT with persistent cystic changes; central SRHM (vitelliform lesion); stable improvement in peripapillary [facility]  Clinical management:  See below  Abbreviations: NFP - Normal foveal profile. CME - cystoid macular edema. PED - pigment epithelial detachment. IRF - intraretinal fluid. SRF - subretinal fluid. EZ - ellipsoid zone. ERM - epiretinal membrane. ORA - outer retinal atrophy. ORT - outer retinal tubulation. SRHM - subretinal hyper-reflective material      Intravitreal Injection, Pharmacologic Agent - OD - Right Eye       Time Out 03/23/2021. 9:31 AM. Confirmed correct patient, procedure, site, and patient consented.   Anesthesia Topical anesthesia was used. Anesthetic medications included Lidocaine 2%, Proparacaine 0.5%.   Procedure Preparation included 5% betadine to ocular surface, eyelid speculum. A (32g) needle was used.   Injection: 2 mg aflibercept 2 MG/0.05ML   Route: Intravitreal, Site: Right Eye   NDC: A3590391, Lot: [id number], Expiration date: 12/22/2021, Waste: 0.05 mL   Post-op Post injection exam found visual acuity of at least counting fingers. The patient tolerated the procedure well. There were no complications. The patient received written and verbal post procedure care education. Post injection medications were not given.      Intravitreal Injection, Pharmacologic Agent - OS - Left Eye       Time Out 03/23/2021. 9:31 AM. Confirmed correct patient, procedure, site, and patient consented.   Anesthesia Topical anesthesia was used. Anesthetic medications included Lidocaine 2%, Proparacaine 0.5%.   Procedure Preparation included 5% betadine to ocular surface, eyelid speculum. A (32g) needle was used.   Injection: 2 mg aflibercept 2 MG/0.05ML   Route: Intravitreal, Site: Left Eye   NDC: M7179715, Lot: [id number], Expiration date: 05/22/2021, Waste: 0.05 mL   Post-op Post injection exam found visual  acuity of at least counting fingers. The patient tolerated the procedure well. There were no complications. The  patient received written and verbal post procedure care education. Post injection medications were not given.             ASSESSMENT/PLAN:   ICD-10-CM   1. Exudative age-related macular degeneration of both eyes with active choroidal neovascularization (HCC)  H35.3231 OCT, Retina - OU - Both Eyes    Intravitreal Injection, Pharmacologic Agent - OD - Right Eye    Intravitreal Injection, Pharmacologic Agent - OS - Left Eye    aflibercept (EYLEA) SOLN 2 mg    aflibercept (EYLEA) SOLN 2 mg    2. Vitreomacular adhesion of both eyes  H43.823     3. Essential hypertension  I10     4. Hypertensive retinopathy of both eyes  H35.033     5. Pseudophakia of both eyes  Z96.1       1. Exudative age related macular degeneration, both eyes  - OD incidental discovery of peripapillary CNV w/ heme and SRF during emergency visit for corneal abrasion OS  - OS Interval conversion of Exudative ARMD noted on 09.22.22 visit (peripapillary CNV w/ SRF) OD:             - S/P IVA OD #1 (03.22.21), #2 (04.19.21), #3 (05.26.21), #4 (06.23.21) -- IVA resistance  - S/P IVE OD #1 sample (07.22.21), #2 (08.24.21), #3 (10.13.21), #4 (11.18.21), #5 (12.30.21), #6 (02.24.22), #7 (04.21.22), #8 (06.30.22), #9 (09.22.22), #10 (12.20.22)  - BCVA OD stable at 20/70; OS decreased to 20/50 from 20/30  - OCT OD: Persistent central VMT; stable improvement in peripapillary CNV and IRF; OS: +VMT with persistent cystic changes; central SRHM (vitelliform lesion); stable improvement in peripapillary SRHM at 6 wks - s/p IVE OS #1 (09.22.22), #2 (10.21.22), #3 (12.20.22) - s/p IVA OS #1 (11.22.22) - recommend IVE OU -- OD #11 and OS #4 today, 01.31.23  - pt wishes to proceed with injections  - RBA of procedure discussed, questions answered  - IVE informed consent obtained, signed and scanned, 09.22.22 OU  - Avastin  informed consent obtained, signed and scanned, 03.22.21  - see procedure note  - Eylea4U benefits investigation started 07.22.21 -- approved for 2022  - f/u in 8 weeks -- DFE, OCT, possible injection OS only  2. VMT OU  - mild -- OCT stable OD; OS with central SRHM (vitelliform lesion) and mild interval increase in cystic changes  - review of OCTs since initial visit shows progressive traction -- hopefully will release uneventfully soon  - monitor   3,4. Hypertensive retinopathy OU  - discussed importance of tight BP control  - monitor   5. Pseudophakia OU  - s/p CE/IOL OU  - IOLs in good position, doing well  - monitor   Ophthalmic Meds Ordered this visit:  Meds ordered this encounter  Medications   aflibercept (EYLEA) SOLN 2 mg   aflibercept (EYLEA) SOLN 2 mg     Return in about 8 weeks (around 05/18/2021) for f/u exu ARMD OU, DFE, OCT.  There are no Patient Instructions on file for this visit.  Explained the diagnoses, plan, and follow up with the patient and they expressed understanding.  Patient expressed understanding of the importance of proper follow up care.   This document serves as a record of services personally performed by Gardiner Sleeper, MD, PhD. It was created on their behalf by Estill Bakes, COT an ophthalmic technician. The creation of this record is the provider's dictation and/or activities during the visit.    Electronically signed by: Estill Bakes,  COT 1.30.23 @ 12:37 AM   This document serves as a record of services personally performed by Gardiner Sleeper, MD, PhD. It was created on their behalf by San Jetty. Owens Shark, OA an ophthalmic technician. The creation of this record is the provider's dictation and/or activities during the visit.    Electronically signed by: San Jetty. Owens Shark, [place] 01.31.2023 12:37 AM   Gardiner Sleeper, M.D., Ph.D. Diseases & Surgery of the Retina and Vitreous [facility]  I have reviewed the above  documentation for accuracy and completeness, and I agree with the above. Gardiner Sleeper, M.D., Ph.D. 03/26/21 12:38 AM   Abbreviations: M myopia (nearsighted); A astigmatism; H hyperopia (farsighted); P presbyopia; Mrx spectacle prescription;  CTL contact lenses; OD right eye; OS left eye; OU both eyes  XT exotropia; ET esotropia; PEK punctate epithelial keratitis; PEE punctate epithelial erosions; DES dry eye syndrome; MGD meibomian gland dysfunction; ATs artificial tears; PFAT's preservative free artificial tears; [place] nuclear sclerotic cataract; PSC posterior subcapsular cataract; ERM epi-retinal membrane; PVD posterior vitreous detachment; RD retinal detachment; DM diabetes mellitus; DR diabetic retinopathy; NPDR non-proliferative diabetic retinopathy; PDR proliferative diabetic retinopathy; CSME clinically significant macular edema; DME diabetic macular edema; dbh dot blot hemorrhages; CWS cotton wool spot; POAG primary open angle glaucoma; C/D cup-to-disc ratio; HVF humphrey visual field; GVF goldmann visual field; OCT optical coherence tomography; IOP intraocular pressure; BRVO Branch retinal vein occlusion; CRVO central retinal vein occlusion; CRAO central retinal artery occlusion; BRAO branch retinal artery occlusion; RT retinal tear; SB scleral buckle; PPV pars plana vitrectomy; VH Vitreous hemorrhage; PRP panretinal laser photocoagulation; IVK intravitreal kenalog; VMT vitreomacular traction; MH Macular hole;  NVD neovascularization of the disc; NVE neovascularization elsewhere; AREDS age related eye disease study; ARMD age related macular degeneration; POAG primary open angle glaucoma; EBMD epithelial/anterior basement membrane dystrophy; ACIOL anterior chamber intraocular lens; IOL intraocular lens; PCIOL posterior chamber intraocular lens; Phaco/IOL phacoemulsification with intraocular lens placement; [place] photorefractive keratectomy; LASIK laser assisted in situ keratomileusis; HTN hypertension; DM  diabetes mellitus; COPD chronic obstructive pulmonary disease

## 2021-03-23 ENCOUNTER — Other Ambulatory Visit: Payer: Self-pay

## 2021-03-23 ENCOUNTER — Encounter (INDEPENDENT_AMBULATORY_CARE_PROVIDER_SITE_OTHER): Payer: Self-pay | Admitting: Ophthalmology

## 2021-03-23 ENCOUNTER — Ambulatory Visit (INDEPENDENT_AMBULATORY_CARE_PROVIDER_SITE_OTHER): Payer: Medicare Other | Admitting: Ophthalmology

## 2021-03-23 DIAGNOSIS — H43823 Vitreomacular adhesion, bilateral: Secondary | ICD-10-CM

## 2021-03-23 DIAGNOSIS — H353231 Exudative age-related macular degeneration, bilateral, with active choroidal neovascularization: Secondary | ICD-10-CM | POA: Diagnosis not present

## 2021-03-23 DIAGNOSIS — I10 Essential (primary) hypertension: Secondary | ICD-10-CM | POA: Diagnosis not present

## 2021-03-23 DIAGNOSIS — H35033 Hypertensive retinopathy, bilateral: Secondary | ICD-10-CM

## 2021-03-23 DIAGNOSIS — Z961 Presence of intraocular lens: Secondary | ICD-10-CM

## 2021-03-25 DIAGNOSIS — R2243 Localized swelling, mass and lump, lower limb, bilateral: Secondary | ICD-10-CM | POA: Diagnosis not present

## 2021-03-25 DIAGNOSIS — N289 Disorder of kidney and ureter, unspecified: Secondary | ICD-10-CM | POA: Diagnosis not present

## 2021-03-25 DIAGNOSIS — Z79899 Other long term (current) drug therapy: Secondary | ICD-10-CM | POA: Diagnosis not present

## 2021-03-25 DIAGNOSIS — E538 Deficiency of other specified B group vitamins: Secondary | ICD-10-CM | POA: Diagnosis not present

## 2021-03-25 DIAGNOSIS — E785 Hyperlipidemia, unspecified: Secondary | ICD-10-CM | POA: Diagnosis not present

## 2021-03-25 DIAGNOSIS — G629 Polyneuropathy, unspecified: Secondary | ICD-10-CM | POA: Diagnosis not present

## 2021-03-25 DIAGNOSIS — E559 Vitamin D deficiency, unspecified: Secondary | ICD-10-CM | POA: Diagnosis not present

## 2021-03-25 DIAGNOSIS — M179 Osteoarthritis of knee, unspecified: Secondary | ICD-10-CM | POA: Diagnosis not present

## 2021-03-25 DIAGNOSIS — J309 Allergic rhinitis, unspecified: Secondary | ICD-10-CM | POA: Diagnosis not present

## 2021-03-25 DIAGNOSIS — Z8679 Personal history of other diseases of the circulatory system: Secondary | ICD-10-CM | POA: Diagnosis not present

## 2021-03-25 DIAGNOSIS — I10 Essential (primary) hypertension: Secondary | ICD-10-CM | POA: Diagnosis not present

## 2021-03-25 DIAGNOSIS — E039 Hypothyroidism, unspecified: Secondary | ICD-10-CM | POA: Diagnosis not present

## 2021-03-26 ENCOUNTER — Encounter (INDEPENDENT_AMBULATORY_CARE_PROVIDER_SITE_OTHER): Payer: Self-pay | Admitting: Ophthalmology

## 2021-03-26 MED ORDER — AFLIBERCEPT 2MG/0.05ML IZ SOLN FOR KALEIDOSCOPE
2.0000 mg | INTRAVITREAL | Status: AC | PRN
  Administered 2021-03-26: 2 mg via INTRAVITREAL

## 2021-04-08 DIAGNOSIS — I4811 Longstanding persistent atrial fibrillation: Secondary | ICD-10-CM | POA: Diagnosis not present

## 2021-04-08 DIAGNOSIS — J440 Chronic obstructive pulmonary disease with acute lower respiratory infection: Secondary | ICD-10-CM | POA: Diagnosis not present

## 2021-04-08 DIAGNOSIS — Z79899 Other long term (current) drug therapy: Secondary | ICD-10-CM | POA: Diagnosis not present

## 2021-04-08 DIAGNOSIS — R0902 Hypoxemia: Secondary | ICD-10-CM | POA: Diagnosis not present

## 2021-04-08 DIAGNOSIS — Z881 Allergy status to other antibiotic agents status: Secondary | ICD-10-CM | POA: Diagnosis not present

## 2021-04-08 DIAGNOSIS — J449 Chronic obstructive pulmonary disease, unspecified: Secondary | ICD-10-CM | POA: Diagnosis not present

## 2021-04-08 DIAGNOSIS — Z9981 Dependence on supplemental oxygen: Secondary | ICD-10-CM | POA: Diagnosis not present

## 2021-04-08 DIAGNOSIS — R531 Weakness: Secondary | ICD-10-CM | POA: Diagnosis not present

## 2021-04-08 DIAGNOSIS — E7800 Pure hypercholesterolemia, unspecified: Secondary | ICD-10-CM | POA: Diagnosis not present

## 2021-04-08 DIAGNOSIS — I517 Cardiomegaly: Secondary | ICD-10-CM | POA: Diagnosis not present

## 2021-04-08 DIAGNOSIS — Z87891 Personal history of nicotine dependence: Secondary | ICD-10-CM | POA: Diagnosis not present

## 2021-04-08 DIAGNOSIS — I499 Cardiac arrhythmia, unspecified: Secondary | ICD-10-CM | POA: Diagnosis not present

## 2021-04-08 DIAGNOSIS — Z7902 Long term (current) use of antithrombotics/antiplatelets: Secondary | ICD-10-CM | POA: Diagnosis not present

## 2021-04-08 DIAGNOSIS — J9601 Acute respiratory failure with hypoxia: Secondary | ICD-10-CM | POA: Diagnosis not present

## 2021-04-08 DIAGNOSIS — Z20822 Contact with and (suspected) exposure to covid-19: Secondary | ICD-10-CM | POA: Diagnosis not present

## 2021-04-08 DIAGNOSIS — F32A Depression, unspecified: Secondary | ICD-10-CM | POA: Diagnosis not present

## 2021-04-08 DIAGNOSIS — R0602 Shortness of breath: Secondary | ICD-10-CM | POA: Diagnosis not present

## 2021-04-08 DIAGNOSIS — Z743 Need for continuous supervision: Secondary | ICD-10-CM | POA: Diagnosis not present

## 2021-04-08 DIAGNOSIS — E039 Hypothyroidism, unspecified: Secondary | ICD-10-CM | POA: Diagnosis not present

## 2021-04-08 DIAGNOSIS — R069 Unspecified abnormalities of breathing: Secondary | ICD-10-CM | POA: Diagnosis not present

## 2021-04-08 DIAGNOSIS — Z7982 Long term (current) use of aspirin: Secondary | ICD-10-CM | POA: Diagnosis not present

## 2021-04-08 DIAGNOSIS — Z792 Long term (current) use of antibiotics: Secondary | ICD-10-CM | POA: Diagnosis not present

## 2021-04-08 DIAGNOSIS — M1990 Unspecified osteoarthritis, unspecified site: Secondary | ICD-10-CM | POA: Diagnosis not present

## 2021-04-13 DIAGNOSIS — J9601 Acute respiratory failure with hypoxia: Secondary | ICD-10-CM | POA: Diagnosis not present

## 2021-04-13 DIAGNOSIS — Z87891 Personal history of nicotine dependence: Secondary | ICD-10-CM | POA: Diagnosis not present

## 2021-04-13 DIAGNOSIS — F32A Depression, unspecified: Secondary | ICD-10-CM | POA: Diagnosis not present

## 2021-04-13 DIAGNOSIS — M1990 Unspecified osteoarthritis, unspecified site: Secondary | ICD-10-CM | POA: Diagnosis not present

## 2021-04-13 DIAGNOSIS — E039 Hypothyroidism, unspecified: Secondary | ICD-10-CM | POA: Diagnosis not present

## 2021-04-13 DIAGNOSIS — I10 Essential (primary) hypertension: Secondary | ICD-10-CM | POA: Diagnosis not present

## 2021-04-13 DIAGNOSIS — J440 Chronic obstructive pulmonary disease with acute lower respiratory infection: Secondary | ICD-10-CM | POA: Diagnosis not present

## 2021-04-13 DIAGNOSIS — E7800 Pure hypercholesterolemia, unspecified: Secondary | ICD-10-CM | POA: Diagnosis not present

## 2021-04-13 DIAGNOSIS — I482 Chronic atrial fibrillation, unspecified: Secondary | ICD-10-CM | POA: Diagnosis not present

## 2021-04-14 DIAGNOSIS — Z8709 Personal history of other diseases of the respiratory system: Secondary | ICD-10-CM | POA: Diagnosis not present

## 2021-04-14 DIAGNOSIS — Z09 Encounter for follow-up examination after completed treatment for conditions other than malignant neoplasm: Secondary | ICD-10-CM | POA: Diagnosis not present

## 2021-04-15 DIAGNOSIS — I10 Essential (primary) hypertension: Secondary | ICD-10-CM | POA: Diagnosis not present

## 2021-04-15 DIAGNOSIS — Z87891 Personal history of nicotine dependence: Secondary | ICD-10-CM | POA: Diagnosis not present

## 2021-04-15 DIAGNOSIS — E039 Hypothyroidism, unspecified: Secondary | ICD-10-CM | POA: Diagnosis not present

## 2021-04-15 DIAGNOSIS — E7800 Pure hypercholesterolemia, unspecified: Secondary | ICD-10-CM | POA: Diagnosis not present

## 2021-04-15 DIAGNOSIS — J9601 Acute respiratory failure with hypoxia: Secondary | ICD-10-CM | POA: Diagnosis not present

## 2021-04-15 DIAGNOSIS — M1990 Unspecified osteoarthritis, unspecified site: Secondary | ICD-10-CM | POA: Diagnosis not present

## 2021-04-15 DIAGNOSIS — F32A Depression, unspecified: Secondary | ICD-10-CM | POA: Diagnosis not present

## 2021-04-15 DIAGNOSIS — I482 Chronic atrial fibrillation, unspecified: Secondary | ICD-10-CM | POA: Diagnosis not present

## 2021-04-15 DIAGNOSIS — J440 Chronic obstructive pulmonary disease with acute lower respiratory infection: Secondary | ICD-10-CM | POA: Diagnosis not present

## 2021-04-19 DIAGNOSIS — F32A Depression, unspecified: Secondary | ICD-10-CM | POA: Diagnosis not present

## 2021-04-19 DIAGNOSIS — M1990 Unspecified osteoarthritis, unspecified site: Secondary | ICD-10-CM | POA: Diagnosis not present

## 2021-04-19 DIAGNOSIS — Z87891 Personal history of nicotine dependence: Secondary | ICD-10-CM | POA: Diagnosis not present

## 2021-04-19 DIAGNOSIS — J440 Chronic obstructive pulmonary disease with acute lower respiratory infection: Secondary | ICD-10-CM | POA: Diagnosis not present

## 2021-04-19 DIAGNOSIS — J9601 Acute respiratory failure with hypoxia: Secondary | ICD-10-CM | POA: Diagnosis not present

## 2021-04-19 DIAGNOSIS — I482 Chronic atrial fibrillation, unspecified: Secondary | ICD-10-CM | POA: Diagnosis not present

## 2021-04-19 DIAGNOSIS — E039 Hypothyroidism, unspecified: Secondary | ICD-10-CM | POA: Diagnosis not present

## 2021-04-19 DIAGNOSIS — I10 Essential (primary) hypertension: Secondary | ICD-10-CM | POA: Diagnosis not present

## 2021-04-19 DIAGNOSIS — E7800 Pure hypercholesterolemia, unspecified: Secondary | ICD-10-CM | POA: Diagnosis not present

## 2021-04-21 DIAGNOSIS — F32A Depression, unspecified: Secondary | ICD-10-CM | POA: Diagnosis not present

## 2021-04-21 DIAGNOSIS — I10 Essential (primary) hypertension: Secondary | ICD-10-CM | POA: Diagnosis not present

## 2021-04-21 DIAGNOSIS — Z87891 Personal history of nicotine dependence: Secondary | ICD-10-CM | POA: Diagnosis not present

## 2021-04-21 DIAGNOSIS — M1990 Unspecified osteoarthritis, unspecified site: Secondary | ICD-10-CM | POA: Diagnosis not present

## 2021-04-21 DIAGNOSIS — I482 Chronic atrial fibrillation, unspecified: Secondary | ICD-10-CM | POA: Diagnosis not present

## 2021-04-21 DIAGNOSIS — J9601 Acute respiratory failure with hypoxia: Secondary | ICD-10-CM | POA: Diagnosis not present

## 2021-04-21 DIAGNOSIS — E7800 Pure hypercholesterolemia, unspecified: Secondary | ICD-10-CM | POA: Diagnosis not present

## 2021-04-21 DIAGNOSIS — E039 Hypothyroidism, unspecified: Secondary | ICD-10-CM | POA: Diagnosis not present

## 2021-04-21 DIAGNOSIS — J440 Chronic obstructive pulmonary disease with acute lower respiratory infection: Secondary | ICD-10-CM | POA: Diagnosis not present

## 2021-04-26 DIAGNOSIS — M1990 Unspecified osteoarthritis, unspecified site: Secondary | ICD-10-CM | POA: Diagnosis not present

## 2021-04-26 DIAGNOSIS — F32A Depression, unspecified: Secondary | ICD-10-CM | POA: Diagnosis not present

## 2021-04-26 DIAGNOSIS — I10 Essential (primary) hypertension: Secondary | ICD-10-CM | POA: Diagnosis not present

## 2021-04-26 DIAGNOSIS — E039 Hypothyroidism, unspecified: Secondary | ICD-10-CM | POA: Diagnosis not present

## 2021-04-26 DIAGNOSIS — Z87891 Personal history of nicotine dependence: Secondary | ICD-10-CM | POA: Diagnosis not present

## 2021-04-26 DIAGNOSIS — J440 Chronic obstructive pulmonary disease with acute lower respiratory infection: Secondary | ICD-10-CM | POA: Diagnosis not present

## 2021-04-26 DIAGNOSIS — E7800 Pure hypercholesterolemia, unspecified: Secondary | ICD-10-CM | POA: Diagnosis not present

## 2021-04-26 DIAGNOSIS — J9601 Acute respiratory failure with hypoxia: Secondary | ICD-10-CM | POA: Diagnosis not present

## 2021-04-26 DIAGNOSIS — I482 Chronic atrial fibrillation, unspecified: Secondary | ICD-10-CM | POA: Diagnosis not present

## 2021-04-29 DIAGNOSIS — I482 Chronic atrial fibrillation, unspecified: Secondary | ICD-10-CM | POA: Diagnosis not present

## 2021-04-29 DIAGNOSIS — J9601 Acute respiratory failure with hypoxia: Secondary | ICD-10-CM | POA: Diagnosis not present

## 2021-04-29 DIAGNOSIS — Z87891 Personal history of nicotine dependence: Secondary | ICD-10-CM | POA: Diagnosis not present

## 2021-04-29 DIAGNOSIS — J440 Chronic obstructive pulmonary disease with acute lower respiratory infection: Secondary | ICD-10-CM | POA: Diagnosis not present

## 2021-04-29 DIAGNOSIS — E7800 Pure hypercholesterolemia, unspecified: Secondary | ICD-10-CM | POA: Diagnosis not present

## 2021-04-29 DIAGNOSIS — F32A Depression, unspecified: Secondary | ICD-10-CM | POA: Diagnosis not present

## 2021-04-29 DIAGNOSIS — E039 Hypothyroidism, unspecified: Secondary | ICD-10-CM | POA: Diagnosis not present

## 2021-04-29 DIAGNOSIS — M1990 Unspecified osteoarthritis, unspecified site: Secondary | ICD-10-CM | POA: Diagnosis not present

## 2021-04-29 DIAGNOSIS — I10 Essential (primary) hypertension: Secondary | ICD-10-CM | POA: Diagnosis not present

## 2021-05-03 DIAGNOSIS — E039 Hypothyroidism, unspecified: Secondary | ICD-10-CM | POA: Diagnosis not present

## 2021-05-03 DIAGNOSIS — Z87891 Personal history of nicotine dependence: Secondary | ICD-10-CM | POA: Diagnosis not present

## 2021-05-03 DIAGNOSIS — I10 Essential (primary) hypertension: Secondary | ICD-10-CM | POA: Diagnosis not present

## 2021-05-03 DIAGNOSIS — J9601 Acute respiratory failure with hypoxia: Secondary | ICD-10-CM | POA: Diagnosis not present

## 2021-05-03 DIAGNOSIS — I482 Chronic atrial fibrillation, unspecified: Secondary | ICD-10-CM | POA: Diagnosis not present

## 2021-05-03 DIAGNOSIS — F32A Depression, unspecified: Secondary | ICD-10-CM | POA: Diagnosis not present

## 2021-05-03 DIAGNOSIS — E7800 Pure hypercholesterolemia, unspecified: Secondary | ICD-10-CM | POA: Diagnosis not present

## 2021-05-03 DIAGNOSIS — M1990 Unspecified osteoarthritis, unspecified site: Secondary | ICD-10-CM | POA: Diagnosis not present

## 2021-05-03 DIAGNOSIS — J440 Chronic obstructive pulmonary disease with acute lower respiratory infection: Secondary | ICD-10-CM | POA: Diagnosis not present

## 2021-05-07 DIAGNOSIS — J9601 Acute respiratory failure with hypoxia: Secondary | ICD-10-CM | POA: Diagnosis not present

## 2021-05-07 DIAGNOSIS — F32A Depression, unspecified: Secondary | ICD-10-CM | POA: Diagnosis not present

## 2021-05-07 DIAGNOSIS — E039 Hypothyroidism, unspecified: Secondary | ICD-10-CM | POA: Diagnosis not present

## 2021-05-07 DIAGNOSIS — E7800 Pure hypercholesterolemia, unspecified: Secondary | ICD-10-CM | POA: Diagnosis not present

## 2021-05-07 DIAGNOSIS — I10 Essential (primary) hypertension: Secondary | ICD-10-CM | POA: Diagnosis not present

## 2021-05-07 DIAGNOSIS — Z87891 Personal history of nicotine dependence: Secondary | ICD-10-CM | POA: Diagnosis not present

## 2021-05-07 DIAGNOSIS — J440 Chronic obstructive pulmonary disease with acute lower respiratory infection: Secondary | ICD-10-CM | POA: Diagnosis not present

## 2021-05-07 DIAGNOSIS — I482 Chronic atrial fibrillation, unspecified: Secondary | ICD-10-CM | POA: Diagnosis not present

## 2021-05-07 DIAGNOSIS — M1990 Unspecified osteoarthritis, unspecified site: Secondary | ICD-10-CM | POA: Diagnosis not present

## 2021-05-08 DIAGNOSIS — J449 Chronic obstructive pulmonary disease, unspecified: Secondary | ICD-10-CM | POA: Diagnosis not present

## 2021-05-10 DIAGNOSIS — I482 Chronic atrial fibrillation, unspecified: Secondary | ICD-10-CM | POA: Diagnosis not present

## 2021-05-10 DIAGNOSIS — M1990 Unspecified osteoarthritis, unspecified site: Secondary | ICD-10-CM | POA: Diagnosis not present

## 2021-05-10 DIAGNOSIS — I10 Essential (primary) hypertension: Secondary | ICD-10-CM | POA: Diagnosis not present

## 2021-05-10 DIAGNOSIS — J440 Chronic obstructive pulmonary disease with acute lower respiratory infection: Secondary | ICD-10-CM | POA: Diagnosis not present

## 2021-05-10 DIAGNOSIS — F32A Depression, unspecified: Secondary | ICD-10-CM | POA: Diagnosis not present

## 2021-05-10 DIAGNOSIS — J9601 Acute respiratory failure with hypoxia: Secondary | ICD-10-CM | POA: Diagnosis not present

## 2021-05-10 DIAGNOSIS — Z87891 Personal history of nicotine dependence: Secondary | ICD-10-CM | POA: Diagnosis not present

## 2021-05-10 DIAGNOSIS — E039 Hypothyroidism, unspecified: Secondary | ICD-10-CM | POA: Diagnosis not present

## 2021-05-10 DIAGNOSIS — E7800 Pure hypercholesterolemia, unspecified: Secondary | ICD-10-CM | POA: Diagnosis not present

## 2021-05-11 DIAGNOSIS — J440 Chronic obstructive pulmonary disease with acute lower respiratory infection: Secondary | ICD-10-CM | POA: Diagnosis not present

## 2021-05-11 DIAGNOSIS — I10 Essential (primary) hypertension: Secondary | ICD-10-CM | POA: Diagnosis not present

## 2021-05-11 DIAGNOSIS — J9601 Acute respiratory failure with hypoxia: Secondary | ICD-10-CM | POA: Diagnosis not present

## 2021-05-11 DIAGNOSIS — F32A Depression, unspecified: Secondary | ICD-10-CM | POA: Diagnosis not present

## 2021-05-11 DIAGNOSIS — M1990 Unspecified osteoarthritis, unspecified site: Secondary | ICD-10-CM | POA: Diagnosis not present

## 2021-05-11 DIAGNOSIS — Z87891 Personal history of nicotine dependence: Secondary | ICD-10-CM | POA: Diagnosis not present

## 2021-05-11 DIAGNOSIS — I482 Chronic atrial fibrillation, unspecified: Secondary | ICD-10-CM | POA: Diagnosis not present

## 2021-05-11 DIAGNOSIS — E039 Hypothyroidism, unspecified: Secondary | ICD-10-CM | POA: Diagnosis not present

## 2021-05-11 DIAGNOSIS — E7800 Pure hypercholesterolemia, unspecified: Secondary | ICD-10-CM | POA: Diagnosis not present

## 2021-05-11 NOTE — Progress Notes (Signed)
?[facility] Note ? ?05/19/2021 ? ?  ? ?CHIEF COMPLAINT ?Patient presents for Retina Follow Up ? ? ?HISTORY OF PRESENT ILLNESS: ?Eddie Kelly is a 88 y.o. male who presents to the clinic today for:  ? ?HPI   ? ? Retina Follow Up   ?Patient presents with  Wet AMD.  In both eyes.  Severity is moderate.  Duration of 8 weeks.  Since onset it is stable.  I, the attending physician,  performed the HPI with the patient and updated documentation appropriately. ? ?  ?  ? ? Comments   ?Pt here for 8 wk ret f/u for exu ARMD OU. Pt states VA is the same, no changes. He does note it took him much longer to recover from injections at previous visit.  ? ?  ?  ?Last edited by Bernarda Caffey, MD on 05/19/2021 12:15 PM.  ?  ?Pt states no change in vision ? ? ?Referring physician: ?Nicholos Johns, MD ?[address] ?[place],  [place] [address] ? ?HISTORICAL INFORMATION:  ? ?Selected notes from the [place] ?Referred by Dr. Shirleen Schirmer for concern of CNVM OD ?LEE: 05/04/2019 ?BCVA: 20/70-1 ?           20/50+1  ? ?CURRENT MEDICATIONS: ?No current outpatient medications on file. (Ophthalmic Drugs)  ? ?No current facility-administered medications for this visit. (Ophthalmic Drugs)  ? ?Current Outpatient Medications (Other)  ?Medication Sig  ? aspirin EC 81 MG tablet Take 81 mg by mouth daily.  ? cyanocobalamin (,VITAMIN B-12,) 1000 MCG/ML injection Inject 1,000 mcg into the muscle every 30 (thirty) days.  ? diclofenac Sodium (VOLTAREN) 1 % GEL Apply 1 application topically 3 (three) times daily as needed for pain.  ? furosemide (LASIX) 20 MG tablet Take 20 mg by mouth daily.  ? levothyroxine (SYNTHROID) 100 MCG tablet Take 100 mcg by mouth daily.  ? loratadine (CLARITIN) 10 MG tablet Take 10 mg by mouth daily.  ? losartan (COZAAR) 100 MG tablet Take 100 mg by mouth daily.  ? metoprolol tartrate (LOPRESSOR) 25 MG tablet Take 25 mg by mouth 2 (two) times daily.  ? montelukast (SINGULAIR) 10 MG  tablet Take 10 mg by mouth at bedtime.  ? Multiple Vitamin (MULTIVITAMIN WITH MINERALS) TABS tablet Take 1 tablet by mouth daily.  ? nitrofurantoin, macrocrystal-monohydrate, (MACROBID) 100 MG capsule Take 100 mg by mouth daily.  ? nitroGLYCERIN (NITROSTAT) 0.4 MG SL tablet Place 0.4 mg under the tongue every 5 (five) minutes as needed for chest pain.  ? tamsulosin (FLOMAX) 0.4 MG CAPS capsule Take 1 capsule by mouth daily.  ? ?No current facility-administered medications for this visit. (Other)  ? ?REVIEW OF SYSTEMS: ?ROS   ?Positive for: Neurological, Cardiovascular, Eyes ?Negative for: Constitutional, Gastrointestinal, Skin, Genitourinary, Musculoskeletal, HENT, Endocrine, Respiratory, Psychiatric, Allergic/Imm, Heme/Lymph ?Last edited by Kingsley Spittle, COT on 05/19/2021  8:18 AM.  ?  ? ?ALLERGIES ?Allergies  ?Allergen Reactions  ? Ciprofloxacin Other (See Comments)  ?  Edema, generalized  ? Levofloxacin   ? ?PAST MEDICAL HISTORY ?Past Medical History:  ?Diagnosis Date  ? A-fib ([place])   ? Anxiety and depression   ? Aspiration pneumonia ([place]) 06/27/2019  ? CAD (coronary artery disease)   ? Chronic atrial fibrillation ([place]) 09/06/2019  ? Epistaxis 06/27/2019  ? Essential hypertension 04/12/2018  ? Hypertension 09/11/2019  ? Hypertensive retinopathy   ? OU  ? Hypothyroidism   ? Left inguinal hernia 08/10/2017  ?  Macular degeneration   ? Wet OD; Dry OS  ? Permanent atrial fibrillation ([place]) 01/14/2019  ? Postoperative examination 11/29/2017  ? Preoperative cardiovascular examination 09/26/2017  ? Presence of Watchman left atrial appendage closure device 02/04/2020  ? PVC (premature ventricular contraction)   ? Sinus bradycardia   ? Vitamin D deficiency   ? ?Past Surgical History:  ?Procedure Laterality Date  ? CATARACT EXTRACTION Bilateral   ? EYE SURGERY Bilateral   ? Cat Sx  ? HERNIA REPAIR    ? ?FAMILY HISTORY ?Family History  ?Problem Relation Age of Onset  ? Cancer Father   ? Heart attack Father   ? Heart disease  Brother   ? Diabetes Brother   ? ?SOCIAL HISTORY ?Social History  ? ?Tobacco Use  ? Smoking status: Former  ?  Types: Cigarettes  ?  Quit date: 35  ?  Years since quitting: 40.2  ? Smokeless tobacco: Never  ?Vaping Use  ? Vaping Use: Never used  ?Substance Use Topics  ? Alcohol use: Yes  ?  Alcohol/week: 1.0 standard drink  ?  Types: 1 Cans of beer per week  ?  Comment: "every once in a blue moon"  ? Drug use: No  ?  ? ?  ?OPHTHALMIC EXAM: ?Base Eye Exam   ? ? Visual Acuity (Snellen - Linear)   ? ?   Right Left  ? Dist cc 20/80 -1 20/30  ? ? Correction: Glasses  ? ?  ?  ? ? Tonometry (Tonopen, 8:24 AM)   ? ?   Right Left  ? Pressure 8 7  ? ?  ?  ? ? Pupils   ? ?   Dark Light Shape React APD  ? Right 4 4 Round NR None  ? Left 2 1 Round Minimal None  ? ?  ?  ? ? Visual Fields (Counting fingers)   ? ?   Left Right  ?  Full Full  ? ?  ?  ? ? Extraocular Movement   ? ?   Right Left  ?  Full, Ortho Full, Ortho  ? ?  ?  ? ? Neuro/Psych   ? ? Oriented x3: Yes  ? Mood/Affect: Normal  ? ?  ?  ? ? Dilation   ? ? Both eyes: 1.0% Mydriacyl, 2.5% Phenylephrine @ 8:25 AM  ? ?  ?  ? ?  ? ?Slit Lamp and Fundus Exam   ? ? Slit Lamp Exam   ? ?   Right Left  ? Lids/Lashes Dermatochalasis - upper lid Dermatochalasis - upper lid  ? Conjunctiva/Sclera White and quiet White and quiet  ? Cornea 2-3+ Punctate epithelial erosions, crocodile shegreen, well healed temporal cataract wounds, dry tear film, irregular epi surface inferiorly 2-3+ Punctate epithelial erosions, crocodile shegreen, well healed temporal cataract wounds, dry tear film, tear film debris, irregular epi surface inferiorly  ? Anterior Chamber Deep and quiet Deep and quiet  ? Iris Round and moderately dilated Round and poorly dilated to 4.40m  ? Lens PC IOL in good position with open PC PC IOL in good position with open PC  ? Anterior Vitreous Vitreous syneresis Vitreous syneresis  ? ?  ?  ? ? Fundus Exam   ? ?   Right Left  ? Disc mild Pallor, Sharp rim, peripapillary CNV  with +exudate ST disc -- improved, exudate gone, no heme mild Pallor, Sharp rim; peripapillary CNV with small pocket of SRF -- stably improved  ? C/D  Ratio 0.4 0.4  ? Macula Flat, Blunted foveal reflex, +peripapillary heme (resolved) and  exudate -- stably improved, edema - stably improved, central VMT with cystic changes - persistent Flat, Blunted foveal reflex, fine Drusen, mild Retinal pigment epithelial mottling, no heme, focal edema nasal mac--improved, peripapillary CNV with shallow SRF stably resolved, central cystic changes (VMT)  ? Vessels Vascular attenuation, mild Tortuousity, perivascular exudation along superior venule -- persistent Vascular attenuation, mild Tortuousity  ? Periphery Attached, mild RPE changes, No heme  Attached, no heme, mild RPE changes    ? ?  ?  ? ?  ? ?Refraction   ? ? Wearing Rx   ? ?   Sphere Cylinder Axis Add  ? Right -3.00 +3.00 005 +2.75  ? Left -1.50 +3.00 167 +2.75  ? ?  ?  ? ?  ? ?IMAGING AND PROCEDURES  ?Imaging and Procedures for _0 @ ? ?OCT, Retina - OU - Both Eyes   ? ?   ?Right Eye ?Quality was borderline. Central Foveal Thickness: 346. Progression has been stable. Findings include abnormal foveal contour, vitreous traction, epiretinal membrane, intraretinal hyper-reflective material, subretinal hyper-reflective material, outer retinal atrophy, pigment epithelial detachment, retinal drusen , no SRF, intraretinal fluid (Persistent central VMT; stable improvement in peripapillary CNV and IRF).  ? ?Left Eye ?Quality was borderline. Central Foveal Thickness: 329. Progression has been stable. Findings include vitreous traction, subretinal hyper-reflective material, retinal drusen , intraretinal fluid, abnormal foveal contour, no SRF (+VMT with persistent cystic changes; central SRHM (vitelliform lesion); stable improvement in peripapillary [facility]).  ? ?Notes ?*Images captured and stored on drive ? ?Diagnosis / Impression:  ?OD: exudative ARMD w/ peripapillary CNVM --  Persistent central VMT; stable improvement in peripapillary CNV and IRF ?OS: exudative ARMD -- +VMT with persistent cystic changes; central SRHM (vitelliform lesion); stable improvement in peripapillary SRHM ? ?Cli

## 2021-05-18 DIAGNOSIS — F32A Depression, unspecified: Secondary | ICD-10-CM | POA: Diagnosis not present

## 2021-05-18 DIAGNOSIS — I482 Chronic atrial fibrillation, unspecified: Secondary | ICD-10-CM | POA: Diagnosis not present

## 2021-05-18 DIAGNOSIS — M1990 Unspecified osteoarthritis, unspecified site: Secondary | ICD-10-CM | POA: Diagnosis not present

## 2021-05-18 DIAGNOSIS — E7800 Pure hypercholesterolemia, unspecified: Secondary | ICD-10-CM | POA: Diagnosis not present

## 2021-05-18 DIAGNOSIS — I10 Essential (primary) hypertension: Secondary | ICD-10-CM | POA: Diagnosis not present

## 2021-05-18 DIAGNOSIS — E039 Hypothyroidism, unspecified: Secondary | ICD-10-CM | POA: Diagnosis not present

## 2021-05-18 DIAGNOSIS — J440 Chronic obstructive pulmonary disease with acute lower respiratory infection: Secondary | ICD-10-CM | POA: Diagnosis not present

## 2021-05-18 DIAGNOSIS — J9601 Acute respiratory failure with hypoxia: Secondary | ICD-10-CM | POA: Diagnosis not present

## 2021-05-18 DIAGNOSIS — Z87891 Personal history of nicotine dependence: Secondary | ICD-10-CM | POA: Diagnosis not present

## 2021-05-19 ENCOUNTER — Ambulatory Visit (INDEPENDENT_AMBULATORY_CARE_PROVIDER_SITE_OTHER): Payer: Medicare Other | Admitting: Ophthalmology

## 2021-05-19 ENCOUNTER — Other Ambulatory Visit: Payer: Self-pay

## 2021-05-19 ENCOUNTER — Encounter (INDEPENDENT_AMBULATORY_CARE_PROVIDER_SITE_OTHER): Payer: Self-pay | Admitting: Ophthalmology

## 2021-05-19 DIAGNOSIS — H43823 Vitreomacular adhesion, bilateral: Secondary | ICD-10-CM

## 2021-05-19 DIAGNOSIS — I10 Essential (primary) hypertension: Secondary | ICD-10-CM | POA: Diagnosis not present

## 2021-05-19 DIAGNOSIS — Z961 Presence of intraocular lens: Secondary | ICD-10-CM

## 2021-05-19 DIAGNOSIS — H35033 Hypertensive retinopathy, bilateral: Secondary | ICD-10-CM | POA: Diagnosis not present

## 2021-05-19 DIAGNOSIS — H353231 Exudative age-related macular degeneration, bilateral, with active choroidal neovascularization: Secondary | ICD-10-CM | POA: Diagnosis not present

## 2021-05-19 MED ORDER — AFLIBERCEPT 2MG/0.05ML IZ SOLN FOR KALEIDOSCOPE
2.0000 mg | INTRAVITREAL | Status: AC | PRN
  Administered 2021-05-19: 2 mg via INTRAVITREAL

## 2021-05-25 DIAGNOSIS — F32A Depression, unspecified: Secondary | ICD-10-CM | POA: Diagnosis not present

## 2021-05-25 DIAGNOSIS — E039 Hypothyroidism, unspecified: Secondary | ICD-10-CM | POA: Diagnosis not present

## 2021-05-25 DIAGNOSIS — Z87891 Personal history of nicotine dependence: Secondary | ICD-10-CM | POA: Diagnosis not present

## 2021-05-25 DIAGNOSIS — E7800 Pure hypercholesterolemia, unspecified: Secondary | ICD-10-CM | POA: Diagnosis not present

## 2021-05-25 DIAGNOSIS — M1990 Unspecified osteoarthritis, unspecified site: Secondary | ICD-10-CM | POA: Diagnosis not present

## 2021-05-25 DIAGNOSIS — J9601 Acute respiratory failure with hypoxia: Secondary | ICD-10-CM | POA: Diagnosis not present

## 2021-05-25 DIAGNOSIS — I10 Essential (primary) hypertension: Secondary | ICD-10-CM | POA: Diagnosis not present

## 2021-05-25 DIAGNOSIS — I482 Chronic atrial fibrillation, unspecified: Secondary | ICD-10-CM | POA: Diagnosis not present

## 2021-05-25 DIAGNOSIS — J440 Chronic obstructive pulmonary disease with acute lower respiratory infection: Secondary | ICD-10-CM | POA: Diagnosis not present

## 2021-05-26 DIAGNOSIS — J440 Chronic obstructive pulmonary disease with acute lower respiratory infection: Secondary | ICD-10-CM | POA: Diagnosis not present

## 2021-05-26 DIAGNOSIS — F32A Depression, unspecified: Secondary | ICD-10-CM | POA: Diagnosis not present

## 2021-05-26 DIAGNOSIS — I10 Essential (primary) hypertension: Secondary | ICD-10-CM | POA: Diagnosis not present

## 2021-05-26 DIAGNOSIS — I482 Chronic atrial fibrillation, unspecified: Secondary | ICD-10-CM | POA: Diagnosis not present

## 2021-05-26 DIAGNOSIS — J9601 Acute respiratory failure with hypoxia: Secondary | ICD-10-CM | POA: Diagnosis not present

## 2021-05-26 DIAGNOSIS — M1990 Unspecified osteoarthritis, unspecified site: Secondary | ICD-10-CM | POA: Diagnosis not present

## 2021-05-26 DIAGNOSIS — E039 Hypothyroidism, unspecified: Secondary | ICD-10-CM | POA: Diagnosis not present

## 2021-05-26 DIAGNOSIS — Z87891 Personal history of nicotine dependence: Secondary | ICD-10-CM | POA: Diagnosis not present

## 2021-05-26 DIAGNOSIS — E7800 Pure hypercholesterolemia, unspecified: Secondary | ICD-10-CM | POA: Diagnosis not present

## 2021-06-04 DIAGNOSIS — F32A Depression, unspecified: Secondary | ICD-10-CM | POA: Diagnosis not present

## 2021-06-04 DIAGNOSIS — E039 Hypothyroidism, unspecified: Secondary | ICD-10-CM | POA: Diagnosis not present

## 2021-06-04 DIAGNOSIS — E7800 Pure hypercholesterolemia, unspecified: Secondary | ICD-10-CM | POA: Diagnosis not present

## 2021-06-04 DIAGNOSIS — J440 Chronic obstructive pulmonary disease with acute lower respiratory infection: Secondary | ICD-10-CM | POA: Diagnosis not present

## 2021-06-04 DIAGNOSIS — I482 Chronic atrial fibrillation, unspecified: Secondary | ICD-10-CM | POA: Diagnosis not present

## 2021-06-04 DIAGNOSIS — Z87891 Personal history of nicotine dependence: Secondary | ICD-10-CM | POA: Diagnosis not present

## 2021-06-04 DIAGNOSIS — J9601 Acute respiratory failure with hypoxia: Secondary | ICD-10-CM | POA: Diagnosis not present

## 2021-06-04 DIAGNOSIS — M1990 Unspecified osteoarthritis, unspecified site: Secondary | ICD-10-CM | POA: Diagnosis not present

## 2021-06-04 DIAGNOSIS — I10 Essential (primary) hypertension: Secondary | ICD-10-CM | POA: Diagnosis not present

## 2021-06-08 DIAGNOSIS — E039 Hypothyroidism, unspecified: Secondary | ICD-10-CM | POA: Diagnosis not present

## 2021-06-08 DIAGNOSIS — E7800 Pure hypercholesterolemia, unspecified: Secondary | ICD-10-CM | POA: Diagnosis not present

## 2021-06-08 DIAGNOSIS — M1990 Unspecified osteoarthritis, unspecified site: Secondary | ICD-10-CM | POA: Diagnosis not present

## 2021-06-08 DIAGNOSIS — J449 Chronic obstructive pulmonary disease, unspecified: Secondary | ICD-10-CM | POA: Diagnosis not present

## 2021-06-08 DIAGNOSIS — I482 Chronic atrial fibrillation, unspecified: Secondary | ICD-10-CM | POA: Diagnosis not present

## 2021-06-08 DIAGNOSIS — J9601 Acute respiratory failure with hypoxia: Secondary | ICD-10-CM | POA: Diagnosis not present

## 2021-06-08 DIAGNOSIS — F32A Depression, unspecified: Secondary | ICD-10-CM | POA: Diagnosis not present

## 2021-06-08 DIAGNOSIS — Z87891 Personal history of nicotine dependence: Secondary | ICD-10-CM | POA: Diagnosis not present

## 2021-06-08 DIAGNOSIS — J440 Chronic obstructive pulmonary disease with acute lower respiratory infection: Secondary | ICD-10-CM | POA: Diagnosis not present

## 2021-06-08 DIAGNOSIS — I10 Essential (primary) hypertension: Secondary | ICD-10-CM | POA: Diagnosis not present

## 2021-06-21 DIAGNOSIS — Z8679 Personal history of other diseases of the circulatory system: Secondary | ICD-10-CM | POA: Diagnosis not present

## 2021-06-21 DIAGNOSIS — G629 Polyneuropathy, unspecified: Secondary | ICD-10-CM | POA: Diagnosis not present

## 2021-06-21 DIAGNOSIS — R2243 Localized swelling, mass and lump, lower limb, bilateral: Secondary | ICD-10-CM | POA: Diagnosis not present

## 2021-06-21 DIAGNOSIS — M179 Osteoarthritis of knee, unspecified: Secondary | ICD-10-CM | POA: Diagnosis not present

## 2021-06-21 DIAGNOSIS — J309 Allergic rhinitis, unspecified: Secondary | ICD-10-CM | POA: Diagnosis not present

## 2021-06-21 DIAGNOSIS — E785 Hyperlipidemia, unspecified: Secondary | ICD-10-CM | POA: Diagnosis not present

## 2021-06-21 DIAGNOSIS — I10 Essential (primary) hypertension: Secondary | ICD-10-CM | POA: Diagnosis not present

## 2021-06-21 DIAGNOSIS — N289 Disorder of kidney and ureter, unspecified: Secondary | ICD-10-CM | POA: Diagnosis not present

## 2021-06-24 DIAGNOSIS — N390 Urinary tract infection, site not specified: Secondary | ICD-10-CM | POA: Diagnosis not present

## 2021-07-08 DIAGNOSIS — J449 Chronic obstructive pulmonary disease, unspecified: Secondary | ICD-10-CM | POA: Diagnosis not present

## 2021-07-15 NOTE — Progress Notes (Signed)
[facility] Note  07/29/2021     CHIEF COMPLAINT Patient presents for Retina Follow Up   HISTORY OF PRESENT ILLNESS: Eddie Kelly is a 88 y.o. male who presents to the clinic today for:   HPI     Retina Follow Up   Patient presents with  Wet AMD.  In both eyes.  This started 10 weeks ago.  I, the attending physician,  performed the HPI with the patient and updated documentation appropriately.        Comments   Patient here for 10 weeks retina follow up for exu ARMD OU. Patient states vision no difference. No eye pain. When wakes up in am for 5 seconds has eye pain. Then goes away.      Last edited by Bernarda Caffey, MD on 07/29/2021  1:24 PM.     Referring physician: Nicholos Johns, MD [place],  [place] [address]  HISTORICAL INFORMATION:   Selected notes from the MEDICAL RECORD NUMBER Referred by Dr. Shirleen Schirmer for concern of CNVM OD LEE: 05/04/2019 BCVA: 20/70-1            20/50+1   CURRENT MEDICATIONS: No current outpatient medications on file. (Ophthalmic Drugs)   No current facility-administered medications for this visit. (Ophthalmic Drugs)   Current Outpatient Medications (Other)  Medication Sig   aspirin EC 81 MG tablet Take 81 mg by mouth daily.   cyanocobalamin (,VITAMIN B-12,) 1000 MCG/ML injection Inject 1,000 mcg into the muscle every 30 (thirty) days.   diclofenac Sodium (VOLTAREN) 1 % GEL Apply 1 application topically 3 (three) times daily as needed for pain.   furosemide (LASIX) 20 MG tablet Take 20 mg by mouth daily.   levothyroxine (SYNTHROID) 100 MCG tablet Take 100 mcg by mouth daily.   loratadine (CLARITIN) 10 MG tablet Take 10 mg by mouth daily.   losartan (COZAAR) 100 MG tablet Take 100 mg by mouth daily.   metoprolol tartrate (LOPRESSOR) 25 MG tablet Take 25 mg by mouth 2 (two) times daily.   montelukast (SINGULAIR) 10 MG tablet Take 10 mg by mouth at bedtime.   Multiple Vitamin (MULTIVITAMIN  WITH MINERALS) TABS tablet Take 1 tablet by mouth daily.   nitrofurantoin, macrocrystal-monohydrate, (MACROBID) 100 MG capsule Take 100 mg by mouth daily.   nitroGLYCERIN (NITROSTAT) 0.4 MG SL tablet Place 0.4 mg under the tongue every 5 (five) minutes as needed for chest pain.   tamsulosin (FLOMAX) 0.4 MG CAPS capsule Take 1 capsule by mouth daily.   No current facility-administered medications for this visit. (Other)   REVIEW OF SYSTEMS: ROS   Positive for: Neurological, Cardiovascular, Eyes Negative for: Constitutional, Gastrointestinal, Skin, Genitourinary, Musculoskeletal, HENT, Endocrine, Respiratory, Psychiatric, Allergic/Imm, Heme/Lymph Last edited by Theodore Demark, COA on 07/29/2021  8:34 AM.     ALLERGIES Allergies  Allergen Reactions   Ciprofloxacin Other (See Comments)    Edema, generalized   Levofloxacin    PAST MEDICAL HISTORY Past Medical History:  Diagnosis Date   A-fib [facility])    Anxiety and depression    Aspiration pneumonia ([place]) 06/27/2019   CAD (coronary artery disease)    Chronic atrial fibrillation ([place]) 09/06/2019   Epistaxis 06/27/2019   Essential hypertension 04/12/2018   Hypertension 09/11/2019   Hypertensive retinopathy    OU   Hypothyroidism    Left inguinal hernia 08/10/2017   Macular degeneration    Wet OD; Dry OS   Permanent atrial fibrillation ([place])  01/14/2019   Postoperative examination 11/29/2017   Preoperative cardiovascular examination 09/26/2017   Presence of Watchman left atrial appendage closure device 02/04/2020   PVC (premature ventricular contraction)    Sinus bradycardia    Vitamin D deficiency    Past Surgical History:  Procedure Laterality Date   CATARACT EXTRACTION Bilateral    EYE SURGERY Bilateral    Cat Sx   HERNIA REPAIR     FAMILY HISTORY Family History  Problem Relation Age of Onset   Cancer Father    Heart attack Father    Heart disease Brother    Diabetes Brother    SOCIAL HISTORY Social History   Tobacco Use    Smoking status: Former    Types: Cigarettes    Quit date: 1983    Years since quitting: 40.4   Smokeless tobacco: Never  Vaping Use   Vaping Use: Never used  Substance Use Topics   Alcohol use: Yes    Alcohol/week: 1.0 standard drink of alcohol    Types: 1 Cans of beer per week    Comment: "every once in a blue moon"   Drug use: No       OPHTHALMIC EXAM: Base Eye Exam     Visual Acuity (Snellen - Linear)       Right Left   Dist cc 20/80 -2 20/30 +2   Dist ph cc NI NI    Correction: Glasses         Tonometry (Tonopen, 8:31 AM)       Right Left   Pressure 09 10         Pupils       Dark Light Shape React APD   Right 4 4 Round NR None   Left 2 1 Round Minimal None         Visual Fields (Counting fingers)       Left Right    Full Full         Extraocular Movement       Right Left    Full, Ortho Full, Ortho         Neuro/Psych     Oriented x3: Yes   Mood/Affect: Normal         Dilation     Both eyes: 1.0% Mydriacyl, 2.5% Phenylephrine @ 8:31 AM           Slit Lamp and Fundus Exam     Slit Lamp Exam       Right Left   Lids/Lashes Dermatochalasis - upper lid Dermatochalasis - upper lid   Conjunctiva/Sclera White and quiet White and quiet   Cornea 2-3+ Punctate epithelial erosions, crocodile shegreen, well healed temporal cataract wounds, dry tear film, irregular epi surface inferiorly 2-3+ Punctate epithelial erosions, crocodile shegreen, well healed temporal cataract wounds, dry tear film, tear film debris, irregular epi surface inferiorly   Anterior Chamber Deep and quiet Deep and quiet   Iris Round and moderately dilated Round and poorly dilated to 4.27m   Lens PC IOL in good position with open PC PC IOL in good position with open PC   Anterior Vitreous Vitreous syneresis Vitreous syneresis         Fundus Exam       Right Left   Disc mild Pallor, Sharp rim, peripapillary CNV with +exudate ST disc -- stably improved,  exudate gone, no heme mild Pallor, Sharp rim; peripapillary CNV with small pocket of SRF -- stably resolved   C/D Ratio 0.4 0.4  Macula Flat, Blunted foveal reflex, +peripapillary heme (resolved) and  exudate -- stably improved, edema - stably improved, central VMT with cystic changes - persistent Flat, Blunted foveal reflex, fine Drusen, mild Retinal pigment epithelial mottling, no heme, focal edema nasal mac--improved, peripapillary CNV with shallow SRF stably resolved, central cystic changes (VMT)   Vessels Vascular attenuation, mild Tortuousity, perivascular exudation along superior venule -- persistent Vascular attenuation, mild Tortuousity   Periphery Attached, mild RPE changes, No heme  Attached, no heme, mild RPE changes             Refraction     Wearing Rx       Sphere Cylinder Axis Add   Right -3.00 +3.00 005 +2.75   Left -1.50 +3.00 167 +2.75           IMAGING AND PROCEDURES  Imaging and Procedures for _0 @  OCT, Retina - OU - Both Eyes       Right Eye Quality was borderline. Central Foveal Thickness: 263. Progression has been stable. Findings include no SRF, abnormal foveal contour, retinal drusen , subretinal hyper-reflective material, intraretinal hyper-reflective material, epiretinal membrane, intraretinal fluid, pigment epithelial detachment, vitreous traction, outer retinal atrophy (Persistent central VMT; stable improvement in peripapillary CNV and IRF).   Left Eye Quality was borderline. Central Foveal Thickness: 290. Progression has been stable. Findings include no SRF, abnormal foveal contour, retinal drusen , subretinal hyper-reflective material, intraretinal fluid, vitreous traction (+VMT with persistent cystic changes; central SRHM (vitelliform lesion); stable improvement in peripapillary [facility]).   Notes *Images captured and stored on drive  Diagnosis / Impression:  OD: exudative ARMD w/ peripapillary CNVM -- Persistent central VMT; stable  improvement in peripapillary CNV and IRF OS: exudative ARMD -- +VMT with persistent cystic changes; central SRHM (vitelliform lesion); stable improvement in peripapillary [facility]  Clinical management:  See below  Abbreviations: NFP - Normal foveal profile. CME - cystoid macular edema. PED - pigment epithelial detachment. IRF - intraretinal fluid. SRF - subretinal fluid. EZ - ellipsoid zone. ERM - epiretinal membrane. ORA - outer retinal atrophy. ORT - outer retinal tubulation. SRHM - subretinal hyper-reflective material      Intravitreal Injection, Pharmacologic Agent - OS - Left Eye       Time Out 07/29/2021. 9:21 AM. Confirmed correct patient, procedure, site, and patient consented.   Anesthesia Topical anesthesia was used. Anesthetic medications included Lidocaine 2%, Proparacaine 0.5%.   Procedure Preparation included 5% betadine to ocular surface, eyelid speculum. A (32g) needle was used.   Injection: 2 mg aflibercept 2 MG/0.05ML   Route: Intravitreal, Site: Left Eye   NDC: A3590391, Lot: [id number], Expiration date: 04/20/2022, Waste: 0 mL   Post-op Post injection exam found visual acuity of at least counting fingers. The patient tolerated the procedure well. There were no complications. The patient received written and verbal post procedure care education. Post injection medications were not given.      Intravitreal Injection, Pharmacologic Agent - OD - Right Eye       Time Out 07/29/2021. 9:21 AM. Confirmed correct patient, procedure, site, and patient consented.   Anesthesia Topical anesthesia was used. Anesthetic medications included Lidocaine 2%, Proparacaine 0.5%.   Procedure Preparation included 5% betadine to ocular surface, eyelid speculum. A (32g) needle was used.   Injection: 2 mg aflibercept 2 MG/0.05ML   Route: Intravitreal, Site: Right Eye   NDC: A3590391, Lot: [id number], Expiration date: 05/21/2022, Waste: 0 mL   Post-op Post injection exam  found visual acuity of at least counting  fingers. The patient tolerated the procedure well. There were no complications. The patient received written and verbal post procedure care education. Post injection medications were not given.            ASSESSMENT/PLAN:   ICD-10-CM   1. Exudative age-related macular degeneration of both eyes with active choroidal neovascularization (HCC)  H35.3231 OCT, Retina - OU - Both Eyes    Intravitreal Injection, Pharmacologic Agent - OS - Left Eye    Intravitreal Injection, Pharmacologic Agent - OD - Right Eye    aflibercept (EYLEA) SOLN 2 mg    aflibercept (EYLEA) SOLN 2 mg    2. Vitreomacular adhesion of both eyes  H43.823     3. Essential hypertension  I10     4. Hypertensive retinopathy of both eyes  H35.033     5. Pseudophakia of both eyes  Z96.1      1. Exudative age related macular degeneration, both eyes  - OD incidental discovery of peripapillary CNV w/ heme and SRF during emergency visit for corneal abrasion OS  - OS Interval conversion of Exudative ARMD noted on 09.22.22 visit (peripapillary CNV w/ SRF)             - S/P IVA OD #1 (03.22.21), #2 (04.19.21), #3 (05.26.21), #4 (06.23.21) -- IVA resistance  - S/P IVE OD #1 sample (07.22.21), #2 (08.24.21), #3 (10.13.21), #4 (11.18.21), #5 (12.30.21), #6 (02.24.22), #7 (04.21.22), #8 (06.30.22), #9 (09.22.22), #10 (12.20.22), #11 (01.31.23), #12 (03.29.23) - s/p IVE OS #1 (09.22.22), #2 (10.21.22), #3 (12.20.22), #4 (01.31.23), #5 (03.29.23) - s/p IVA OS #1 (11.22.22)  - BCVA OD stable at 20/80; OS stable at 20/30  - OCT OD: Persistent central VMT; stable improvement in peripapillary CNV and IRF; OS: +VMT with persistent cystic changes; central SRHM (vitelliform lesion); stable improvement in peripapillary SRHM at 10 wks - recommend IVE OU -- OD #12 and OS #5 today, 03.29.23 with follow up extended to 12 weeks  - pt wishes to proceed with injections  - RBA of procedure discussed, questions  answered  - IVE informed consent obtained, signed and scanned, 09.22.22 OU  - Avastin informed consent obtained, signed and scanned, 03.22.21  - see procedure note  - [email or web address] benefits investigation started 07.22.21 -- approved for 2022  - f/u in 12 weeks -- DFE, OCT, possible injxns -- tx and ext as able  2. VMT OU  - mild -- OCT stable OD; OS with central SRHM (vitelliform lesion) and mild interval increase in cystic changes  - review of OCTs since initial visit shows progressive traction -- hopefully will release uneventfully soon  - monitor   3,4. Hypertensive retinopathy OU  - discussed importance of tight BP control  - monitor   5. Pseudophakia OU  - s/p CE/IOL OU  - IOLs in good position, doing well  - monitor   Ophthalmic Meds Ordered this visit:  Meds ordered this encounter  Medications   aflibercept (EYLEA) SOLN 2 mg   aflibercept (EYLEA) SOLN 2 mg     Return in about 12 weeks (around 10/21/2021) for DFE, OCT, possible injection.  There are no Patient Instructions on file for this visit.  Explained the diagnoses, plan, and follow up with the patient and they expressed understanding.  Patient expressed understanding of the importance of proper follow up care.   This document serves as a record of services personally performed by Gardiner Sleeper, MD, PhD. It was created on their behalf by San Jetty. Owens Shark, OA  an ophthalmic technician. The creation of this record is the provider's dictation and/or activities during the visit.    Electronically signed by: San Jetty. Owens Shark, [place] 05.25.2023 1:26 PM  This document serves as a record of services personally performed by Gardiner Sleeper, MD, PhD. It was created on their behalf by Leonie Douglas, an ophthalmic technician. The creation of this record is the provider's dictation and/or activities during the visit.    Electronically signed by: Leonie Douglas COA, 07/29/21  1:26 PM  Gardiner Sleeper, M.D., Ph.D. Diseases & Surgery of the  Retina and Vitreous [facility]  I have reviewed the above documentation for accuracy and completeness, and I agree with the above. Gardiner Sleeper, M.D., Ph.D. 07/29/21 1:29 PM  Abbreviations: M myopia (nearsighted); A astigmatism; H hyperopia (farsighted); P presbyopia; Mrx spectacle prescription;  CTL contact lenses; OD right eye; OS left eye; OU both eyes  XT exotropia; ET esotropia; PEK punctate epithelial keratitis; PEE punctate epithelial erosions; DES dry eye syndrome; MGD meibomian gland dysfunction; ATs artificial tears; PFAT's preservative free artificial tears; [place] nuclear sclerotic cataract; PSC posterior subcapsular cataract; ERM epi-retinal membrane; PVD posterior vitreous detachment; RD retinal detachment; DM diabetes mellitus; DR diabetic retinopathy; NPDR non-proliferative diabetic retinopathy; PDR proliferative diabetic retinopathy; CSME clinically significant macular edema; DME diabetic macular edema; dbh dot blot hemorrhages; CWS cotton wool spot; POAG primary open angle glaucoma; C/D cup-to-disc ratio; HVF humphrey visual field; GVF goldmann visual field; OCT optical coherence tomography; IOP intraocular pressure; BRVO Branch retinal vein occlusion; CRVO central retinal vein occlusion; CRAO central retinal artery occlusion; BRAO branch retinal artery occlusion; RT retinal tear; SB scleral buckle; PPV pars plana vitrectomy; VH Vitreous hemorrhage; PRP panretinal laser photocoagulation; IVK intravitreal kenalog; VMT vitreomacular traction; MH Macular hole;  NVD neovascularization of the disc; NVE neovascularization elsewhere; AREDS age related eye disease study; ARMD age related macular degeneration; POAG primary open angle glaucoma; EBMD epithelial/anterior basement membrane dystrophy; ACIOL anterior chamber intraocular lens; IOL intraocular lens; PCIOL posterior chamber intraocular lens; Phaco/IOL phacoemulsification with intraocular lens placement; [place]  photorefractive keratectomy; LASIK laser assisted in situ keratomileusis; HTN hypertension; DM diabetes mellitus; COPD chronic obstructive pulmonary disease

## 2021-07-29 ENCOUNTER — Ambulatory Visit (INDEPENDENT_AMBULATORY_CARE_PROVIDER_SITE_OTHER): Payer: Medicare Other | Admitting: Ophthalmology

## 2021-07-29 ENCOUNTER — Encounter (INDEPENDENT_AMBULATORY_CARE_PROVIDER_SITE_OTHER): Payer: Self-pay | Admitting: Ophthalmology

## 2021-07-29 DIAGNOSIS — Z961 Presence of intraocular lens: Secondary | ICD-10-CM | POA: Diagnosis not present

## 2021-07-29 DIAGNOSIS — H35033 Hypertensive retinopathy, bilateral: Secondary | ICD-10-CM

## 2021-07-29 DIAGNOSIS — H43823 Vitreomacular adhesion, bilateral: Secondary | ICD-10-CM | POA: Diagnosis not present

## 2021-07-29 DIAGNOSIS — H353231 Exudative age-related macular degeneration, bilateral, with active choroidal neovascularization: Secondary | ICD-10-CM | POA: Diagnosis not present

## 2021-07-29 DIAGNOSIS — I10 Essential (primary) hypertension: Secondary | ICD-10-CM | POA: Diagnosis not present

## 2021-07-29 MED ORDER — AFLIBERCEPT 2MG/0.05ML IZ SOLN FOR KALEIDOSCOPE
2.0000 mg | INTRAVITREAL | Status: AC | PRN
  Administered 2021-07-29: 2 mg via INTRAVITREAL

## 2021-08-08 DIAGNOSIS — J449 Chronic obstructive pulmonary disease, unspecified: Secondary | ICD-10-CM | POA: Diagnosis not present

## 2021-09-07 DIAGNOSIS — J449 Chronic obstructive pulmonary disease, unspecified: Secondary | ICD-10-CM | POA: Diagnosis not present

## 2021-09-22 DIAGNOSIS — R339 Retention of urine, unspecified: Secondary | ICD-10-CM | POA: Diagnosis not present

## 2021-09-23 DIAGNOSIS — E039 Hypothyroidism, unspecified: Secondary | ICD-10-CM | POA: Diagnosis not present

## 2021-09-23 DIAGNOSIS — R2243 Localized swelling, mass and lump, lower limb, bilateral: Secondary | ICD-10-CM | POA: Diagnosis not present

## 2021-09-23 DIAGNOSIS — M179 Osteoarthritis of knee, unspecified: Secondary | ICD-10-CM | POA: Diagnosis not present

## 2021-09-23 DIAGNOSIS — E785 Hyperlipidemia, unspecified: Secondary | ICD-10-CM | POA: Diagnosis not present

## 2021-09-23 DIAGNOSIS — E559 Vitamin D deficiency, unspecified: Secondary | ICD-10-CM | POA: Diagnosis not present

## 2021-09-23 DIAGNOSIS — G629 Polyneuropathy, unspecified: Secondary | ICD-10-CM | POA: Diagnosis not present

## 2021-09-23 DIAGNOSIS — J309 Allergic rhinitis, unspecified: Secondary | ICD-10-CM | POA: Diagnosis not present

## 2021-09-23 DIAGNOSIS — Z8679 Personal history of other diseases of the circulatory system: Secondary | ICD-10-CM | POA: Diagnosis not present

## 2021-09-23 DIAGNOSIS — N289 Disorder of kidney and ureter, unspecified: Secondary | ICD-10-CM | POA: Diagnosis not present

## 2021-09-23 DIAGNOSIS — I10 Essential (primary) hypertension: Secondary | ICD-10-CM | POA: Diagnosis not present

## 2021-09-24 DIAGNOSIS — Z79899 Other long term (current) drug therapy: Secondary | ICD-10-CM | POA: Diagnosis not present

## 2021-09-24 DIAGNOSIS — E785 Hyperlipidemia, unspecified: Secondary | ICD-10-CM | POA: Diagnosis not present

## 2021-09-24 DIAGNOSIS — E559 Vitamin D deficiency, unspecified: Secondary | ICD-10-CM | POA: Diagnosis not present

## 2021-09-29 DIAGNOSIS — Z9181 History of falling: Secondary | ICD-10-CM | POA: Diagnosis not present

## 2021-09-29 DIAGNOSIS — Z0000 Encounter for general adult medical examination without abnormal findings: Secondary | ICD-10-CM | POA: Diagnosis not present

## 2021-09-29 DIAGNOSIS — E785 Hyperlipidemia, unspecified: Secondary | ICD-10-CM | POA: Diagnosis not present

## 2021-09-29 DIAGNOSIS — Z139 Encounter for screening, unspecified: Secondary | ICD-10-CM | POA: Diagnosis not present

## 2021-10-08 DIAGNOSIS — J449 Chronic obstructive pulmonary disease, unspecified: Secondary | ICD-10-CM | POA: Diagnosis not present

## 2021-10-15 DIAGNOSIS — J690 Pneumonitis due to inhalation of food and vomit: Secondary | ICD-10-CM | POA: Diagnosis not present

## 2021-10-15 DIAGNOSIS — J439 Emphysema, unspecified: Secondary | ICD-10-CM | POA: Diagnosis not present

## 2021-10-15 DIAGNOSIS — R079 Chest pain, unspecified: Secondary | ICD-10-CM | POA: Diagnosis not present

## 2021-10-15 DIAGNOSIS — R059 Cough, unspecified: Secondary | ICD-10-CM | POA: Diagnosis not present

## 2021-10-15 DIAGNOSIS — I700 Atherosclerosis of aorta: Secondary | ICD-10-CM | POA: Diagnosis not present

## 2021-10-15 DIAGNOSIS — J90 Pleural effusion, not elsewhere classified: Secondary | ICD-10-CM | POA: Diagnosis not present

## 2021-10-26 DIAGNOSIS — R7401 Elevation of levels of liver transaminase levels: Secondary | ICD-10-CM | POA: Diagnosis not present

## 2021-10-26 DIAGNOSIS — D72829 Elevated white blood cell count, unspecified: Secondary | ICD-10-CM | POA: Diagnosis not present

## 2021-10-26 DIAGNOSIS — J90 Pleural effusion, not elsewhere classified: Secondary | ICD-10-CM | POA: Diagnosis not present

## 2021-10-26 DIAGNOSIS — E785 Hyperlipidemia, unspecified: Secondary | ICD-10-CM | POA: Diagnosis not present

## 2021-10-26 DIAGNOSIS — R5381 Other malaise: Secondary | ICD-10-CM | POA: Diagnosis not present

## 2021-10-26 DIAGNOSIS — Z01810 Encounter for preprocedural cardiovascular examination: Secondary | ICD-10-CM | POA: Diagnosis not present

## 2021-10-26 DIAGNOSIS — R079 Chest pain, unspecified: Secondary | ICD-10-CM | POA: Diagnosis not present

## 2021-10-26 DIAGNOSIS — I340 Nonrheumatic mitral (valve) insufficiency: Secondary | ICD-10-CM | POA: Diagnosis not present

## 2021-10-26 DIAGNOSIS — R338 Other retention of urine: Secondary | ICD-10-CM | POA: Diagnosis not present

## 2021-10-26 DIAGNOSIS — Z79899 Other long term (current) drug therapy: Secondary | ICD-10-CM | POA: Diagnosis not present

## 2021-10-26 DIAGNOSIS — I4891 Unspecified atrial fibrillation: Secondary | ICD-10-CM | POA: Diagnosis not present

## 2021-10-26 DIAGNOSIS — Z7401 Bed confinement status: Secondary | ICD-10-CM | POA: Diagnosis not present

## 2021-10-26 DIAGNOSIS — K829 Disease of gallbladder, unspecified: Secondary | ICD-10-CM | POA: Diagnosis not present

## 2021-10-26 DIAGNOSIS — M1990 Unspecified osteoarthritis, unspecified site: Secondary | ICD-10-CM | POA: Diagnosis not present

## 2021-10-26 DIAGNOSIS — K8010 Calculus of gallbladder with chronic cholecystitis without obstruction: Secondary | ICD-10-CM | POA: Diagnosis not present

## 2021-10-26 DIAGNOSIS — I482 Chronic atrial fibrillation, unspecified: Secondary | ICD-10-CM | POA: Diagnosis not present

## 2021-10-26 DIAGNOSIS — K8000 Calculus of gallbladder with acute cholecystitis without obstruction: Secondary | ICD-10-CM | POA: Diagnosis not present

## 2021-10-26 DIAGNOSIS — E039 Hypothyroidism, unspecified: Secondary | ICD-10-CM | POA: Diagnosis not present

## 2021-10-26 DIAGNOSIS — N183 Chronic kidney disease, stage 3 unspecified: Secondary | ICD-10-CM | POA: Diagnosis not present

## 2021-10-26 DIAGNOSIS — R279 Unspecified lack of coordination: Secondary | ICD-10-CM | POA: Diagnosis not present

## 2021-10-26 DIAGNOSIS — J432 Centrilobular emphysema: Secondary | ICD-10-CM | POA: Diagnosis not present

## 2021-10-26 DIAGNOSIS — I480 Paroxysmal atrial fibrillation: Secondary | ICD-10-CM | POA: Diagnosis not present

## 2021-10-26 DIAGNOSIS — K8012 Calculus of gallbladder with acute and chronic cholecystitis without obstruction: Secondary | ICD-10-CM | POA: Diagnosis not present

## 2021-10-26 DIAGNOSIS — K429 Umbilical hernia without obstruction or gangrene: Secondary | ICD-10-CM | POA: Diagnosis not present

## 2021-10-26 DIAGNOSIS — M6250 Muscle wasting and atrophy, not elsewhere classified, unspecified site: Secondary | ICD-10-CM | POA: Diagnosis not present

## 2021-10-26 DIAGNOSIS — I2510 Atherosclerotic heart disease of native coronary artery without angina pectoris: Secondary | ICD-10-CM | POA: Diagnosis not present

## 2021-10-26 DIAGNOSIS — R778 Other specified abnormalities of plasma proteins: Secondary | ICD-10-CM | POA: Diagnosis not present

## 2021-10-26 DIAGNOSIS — N179 Acute kidney failure, unspecified: Secondary | ICD-10-CM | POA: Diagnosis not present

## 2021-10-26 DIAGNOSIS — Z743 Need for continuous supervision: Secondary | ICD-10-CM | POA: Diagnosis not present

## 2021-10-26 DIAGNOSIS — I10 Essential (primary) hypertension: Secondary | ICD-10-CM | POA: Diagnosis not present

## 2021-10-26 DIAGNOSIS — K5900 Constipation, unspecified: Secondary | ICD-10-CM | POA: Diagnosis not present

## 2021-10-26 DIAGNOSIS — R109 Unspecified abdominal pain: Secondary | ICD-10-CM | POA: Diagnosis not present

## 2021-10-26 DIAGNOSIS — J449 Chronic obstructive pulmonary disease, unspecified: Secondary | ICD-10-CM | POA: Diagnosis not present

## 2021-10-26 DIAGNOSIS — D696 Thrombocytopenia, unspecified: Secondary | ICD-10-CM | POA: Diagnosis not present

## 2021-10-26 DIAGNOSIS — Z7982 Long term (current) use of aspirin: Secondary | ICD-10-CM | POA: Diagnosis not present

## 2021-10-26 DIAGNOSIS — N3289 Other specified disorders of bladder: Secondary | ICD-10-CM | POA: Diagnosis not present

## 2021-10-26 DIAGNOSIS — I700 Atherosclerosis of aorta: Secondary | ICD-10-CM | POA: Diagnosis not present

## 2021-10-26 DIAGNOSIS — M6281 Muscle weakness (generalized): Secondary | ICD-10-CM | POA: Diagnosis not present

## 2021-10-26 DIAGNOSIS — Z95818 Presence of other cardiac implants and grafts: Secondary | ICD-10-CM | POA: Diagnosis not present

## 2021-10-26 DIAGNOSIS — K828 Other specified diseases of gallbladder: Secondary | ICD-10-CM | POA: Diagnosis not present

## 2021-10-26 DIAGNOSIS — I272 Pulmonary hypertension, unspecified: Secondary | ICD-10-CM | POA: Diagnosis not present

## 2021-10-26 DIAGNOSIS — I071 Rheumatic tricuspid insufficiency: Secondary | ICD-10-CM | POA: Diagnosis not present

## 2021-10-26 DIAGNOSIS — E46 Unspecified protein-calorie malnutrition: Secondary | ICD-10-CM | POA: Diagnosis not present

## 2021-10-26 DIAGNOSIS — R140 Abdominal distension (gaseous): Secondary | ICD-10-CM | POA: Diagnosis not present

## 2021-10-26 DIAGNOSIS — N281 Cyst of kidney, acquired: Secondary | ICD-10-CM | POA: Diagnosis not present

## 2021-10-26 DIAGNOSIS — R2689 Other abnormalities of gait and mobility: Secondary | ICD-10-CM | POA: Diagnosis not present

## 2021-10-26 DIAGNOSIS — K8020 Calculus of gallbladder without cholecystitis without obstruction: Secondary | ICD-10-CM | POA: Diagnosis not present

## 2021-10-26 DIAGNOSIS — D649 Anemia, unspecified: Secondary | ICD-10-CM | POA: Diagnosis not present

## 2021-10-26 DIAGNOSIS — N1330 Unspecified hydronephrosis: Secondary | ICD-10-CM | POA: Diagnosis not present

## 2021-10-26 DIAGNOSIS — M810 Age-related osteoporosis without current pathological fracture: Secondary | ICD-10-CM | POA: Diagnosis not present

## 2021-10-26 DIAGNOSIS — R1011 Right upper quadrant pain: Secondary | ICD-10-CM | POA: Diagnosis not present

## 2021-10-26 DIAGNOSIS — K838 Other specified diseases of biliary tract: Secondary | ICD-10-CM | POA: Diagnosis not present

## 2021-10-26 DIAGNOSIS — Z87891 Personal history of nicotine dependence: Secondary | ICD-10-CM | POA: Diagnosis not present

## 2021-10-26 DIAGNOSIS — D539 Nutritional anemia, unspecified: Secondary | ICD-10-CM | POA: Diagnosis not present

## 2021-10-26 DIAGNOSIS — R6889 Other general symptoms and signs: Secondary | ICD-10-CM | POA: Diagnosis not present

## 2021-10-26 DIAGNOSIS — F32A Depression, unspecified: Secondary | ICD-10-CM | POA: Diagnosis not present

## 2021-10-26 DIAGNOSIS — Z881 Allergy status to other antibiotic agents status: Secondary | ICD-10-CM | POA: Diagnosis not present

## 2021-10-27 DIAGNOSIS — Z95818 Presence of other cardiac implants and grafts: Secondary | ICD-10-CM | POA: Diagnosis not present

## 2021-10-27 DIAGNOSIS — Z01810 Encounter for preprocedural cardiovascular examination: Secondary | ICD-10-CM | POA: Diagnosis not present

## 2021-10-27 DIAGNOSIS — I2510 Atherosclerotic heart disease of native coronary artery without angina pectoris: Secondary | ICD-10-CM | POA: Diagnosis not present

## 2021-10-27 DIAGNOSIS — I10 Essential (primary) hypertension: Secondary | ICD-10-CM

## 2021-10-27 DIAGNOSIS — I340 Nonrheumatic mitral (valve) insufficiency: Secondary | ICD-10-CM | POA: Diagnosis not present

## 2021-10-27 DIAGNOSIS — I482 Chronic atrial fibrillation, unspecified: Secondary | ICD-10-CM | POA: Diagnosis not present

## 2021-10-28 ENCOUNTER — Encounter (INDEPENDENT_AMBULATORY_CARE_PROVIDER_SITE_OTHER): Payer: Medicare Other | Admitting: Ophthalmology

## 2021-10-28 DIAGNOSIS — Z961 Presence of intraocular lens: Secondary | ICD-10-CM

## 2021-10-28 DIAGNOSIS — Z95818 Presence of other cardiac implants and grafts: Secondary | ICD-10-CM | POA: Diagnosis not present

## 2021-10-28 DIAGNOSIS — I482 Chronic atrial fibrillation, unspecified: Secondary | ICD-10-CM | POA: Diagnosis not present

## 2021-10-28 DIAGNOSIS — I10 Essential (primary) hypertension: Secondary | ICD-10-CM

## 2021-10-28 DIAGNOSIS — Z01810 Encounter for preprocedural cardiovascular examination: Secondary | ICD-10-CM | POA: Diagnosis not present

## 2021-10-28 DIAGNOSIS — H35033 Hypertensive retinopathy, bilateral: Secondary | ICD-10-CM

## 2021-10-28 DIAGNOSIS — H353231 Exudative age-related macular degeneration, bilateral, with active choroidal neovascularization: Secondary | ICD-10-CM

## 2021-10-28 DIAGNOSIS — H43823 Vitreomacular adhesion, bilateral: Secondary | ICD-10-CM

## 2021-10-28 DIAGNOSIS — I2510 Atherosclerotic heart disease of native coronary artery without angina pectoris: Secondary | ICD-10-CM | POA: Diagnosis not present

## 2021-10-29 DIAGNOSIS — Z95818 Presence of other cardiac implants and grafts: Secondary | ICD-10-CM | POA: Diagnosis not present

## 2021-10-29 DIAGNOSIS — I482 Chronic atrial fibrillation, unspecified: Secondary | ICD-10-CM | POA: Diagnosis not present

## 2021-10-29 DIAGNOSIS — I2510 Atherosclerotic heart disease of native coronary artery without angina pectoris: Secondary | ICD-10-CM | POA: Diagnosis not present

## 2021-10-29 DIAGNOSIS — I10 Essential (primary) hypertension: Secondary | ICD-10-CM | POA: Diagnosis not present

## 2021-10-30 DIAGNOSIS — I482 Chronic atrial fibrillation, unspecified: Secondary | ICD-10-CM

## 2021-10-30 DIAGNOSIS — I10 Essential (primary) hypertension: Secondary | ICD-10-CM

## 2021-10-30 DIAGNOSIS — I2510 Atherosclerotic heart disease of native coronary artery without angina pectoris: Secondary | ICD-10-CM

## 2021-10-30 DIAGNOSIS — Z95818 Presence of other cardiac implants and grafts: Secondary | ICD-10-CM

## 2021-10-30 DIAGNOSIS — I071 Rheumatic tricuspid insufficiency: Secondary | ICD-10-CM

## 2021-10-30 DIAGNOSIS — I272 Pulmonary hypertension, unspecified: Secondary | ICD-10-CM

## 2021-11-05 DIAGNOSIS — N183 Chronic kidney disease, stage 3 unspecified: Secondary | ICD-10-CM | POA: Diagnosis not present

## 2021-11-05 DIAGNOSIS — M810 Age-related osteoporosis without current pathological fracture: Secondary | ICD-10-CM | POA: Diagnosis not present

## 2021-11-05 DIAGNOSIS — R279 Unspecified lack of coordination: Secondary | ICD-10-CM | POA: Diagnosis not present

## 2021-11-05 DIAGNOSIS — M6281 Muscle weakness (generalized): Secondary | ICD-10-CM | POA: Diagnosis not present

## 2021-11-05 DIAGNOSIS — R5381 Other malaise: Secondary | ICD-10-CM | POA: Diagnosis not present

## 2021-11-05 DIAGNOSIS — Z7401 Bed confinement status: Secondary | ICD-10-CM | POA: Diagnosis not present

## 2021-11-05 DIAGNOSIS — E46 Unspecified protein-calorie malnutrition: Secondary | ICD-10-CM | POA: Diagnosis not present

## 2021-11-05 DIAGNOSIS — I482 Chronic atrial fibrillation, unspecified: Secondary | ICD-10-CM | POA: Diagnosis not present

## 2021-11-05 DIAGNOSIS — Z743 Need for continuous supervision: Secondary | ICD-10-CM | POA: Diagnosis not present

## 2021-11-05 DIAGNOSIS — M6250 Muscle wasting and atrophy, not elsewhere classified, unspecified site: Secondary | ICD-10-CM | POA: Diagnosis not present

## 2021-11-05 DIAGNOSIS — J449 Chronic obstructive pulmonary disease, unspecified: Secondary | ICD-10-CM | POA: Diagnosis not present

## 2021-11-05 DIAGNOSIS — R338 Other retention of urine: Secondary | ICD-10-CM | POA: Diagnosis not present

## 2021-11-05 DIAGNOSIS — K8000 Calculus of gallbladder with acute cholecystitis without obstruction: Secondary | ICD-10-CM | POA: Diagnosis not present

## 2021-11-05 DIAGNOSIS — N179 Acute kidney failure, unspecified: Secondary | ICD-10-CM | POA: Diagnosis not present

## 2021-11-05 DIAGNOSIS — K8012 Calculus of gallbladder with acute and chronic cholecystitis without obstruction: Secondary | ICD-10-CM | POA: Diagnosis not present

## 2021-11-05 DIAGNOSIS — I480 Paroxysmal atrial fibrillation: Secondary | ICD-10-CM | POA: Diagnosis not present

## 2021-11-05 DIAGNOSIS — N1831 Chronic kidney disease, stage 3a: Secondary | ICD-10-CM | POA: Diagnosis not present

## 2021-11-05 DIAGNOSIS — D649 Anemia, unspecified: Secondary | ICD-10-CM | POA: Diagnosis not present

## 2021-11-05 DIAGNOSIS — I10 Essential (primary) hypertension: Secondary | ICD-10-CM | POA: Diagnosis not present

## 2021-11-05 DIAGNOSIS — R2689 Other abnormalities of gait and mobility: Secondary | ICD-10-CM | POA: Diagnosis not present

## 2021-11-05 DIAGNOSIS — R339 Retention of urine, unspecified: Secondary | ICD-10-CM | POA: Diagnosis not present

## 2021-11-05 DIAGNOSIS — R262 Difficulty in walking, not elsewhere classified: Secondary | ICD-10-CM | POA: Diagnosis not present

## 2021-11-05 DIAGNOSIS — E039 Hypothyroidism, unspecified: Secondary | ICD-10-CM | POA: Diagnosis not present

## 2021-11-05 DIAGNOSIS — Z95818 Presence of other cardiac implants and grafts: Secondary | ICD-10-CM | POA: Diagnosis not present

## 2021-11-06 DIAGNOSIS — R262 Difficulty in walking, not elsewhere classified: Secondary | ICD-10-CM | POA: Diagnosis not present

## 2021-11-06 DIAGNOSIS — D649 Anemia, unspecified: Secondary | ICD-10-CM | POA: Diagnosis not present

## 2021-11-06 DIAGNOSIS — N1831 Chronic kidney disease, stage 3a: Secondary | ICD-10-CM | POA: Diagnosis not present

## 2021-11-08 DIAGNOSIS — J449 Chronic obstructive pulmonary disease, unspecified: Secondary | ICD-10-CM | POA: Diagnosis not present

## 2021-11-18 DIAGNOSIS — R339 Retention of urine, unspecified: Secondary | ICD-10-CM | POA: Diagnosis not present

## 2022-01-17 ENCOUNTER — Encounter: Payer: Self-pay | Admitting: Gastroenterology

## 2022-02-01 NOTE — Progress Notes (Shared)
[facility] Note  02/03/2022     CHIEF COMPLAINT Patient presents for Retina Follow Up   HISTORY OF PRESENT ILLNESS: Eddie Kelly is a 88 y.o. male who presents to the clinic today for:   HPI     Retina Follow Up   In both eyes.  Severity is moderate.  Duration of 6 months.  Since onset it is gradually worsening.  I, the attending physician,  performed the HPI with the patient and updated documentation appropriately.        Comments   Pt here for 6 mo ret f/u for exu ARMD OU. Pt states he had an emergency cholecystectomy in September, reason for delay. Pt states he does feel VA has deteriorated.       Last edited by Bernarda Caffey, MD on 02/03/2022 11:00 AM.    Pt is delayed to follow up from 12 weeks to 6 months due to an emergency cholecystectomy right before his last appt, pt had to have rehab after sx, pt states his eyes have been blurry upon waking, but he has been using Systane TID OU and that seems to be helping  Referring physician: Nicholos Johns, MD [place],   [id number]  HISTORICAL INFORMATION:   Selected notes from the MEDICAL RECORD NUMBER Referred by Dr. Shirleen Schirmer for concern of CNVM OD LEE: 05/04/2019 BCVA: 20/70-1            20/50+1   CURRENT MEDICATIONS: No current outpatient medications on file. (Ophthalmic Drugs)   No current facility-administered medications for this visit. (Ophthalmic Drugs)   Current Outpatient Medications (Other)  Medication Sig   aspirin EC 81 MG tablet Take 81 mg by mouth daily.   cyanocobalamin (,VITAMIN B-12,) 1000 MCG/ML injection Inject 1,000 mcg into the muscle every 30 (thirty) days.   diclofenac Sodium (VOLTAREN) 1 % GEL Apply 1 application topically 3 (three) times daily as needed for pain.   furosemide (LASIX) 20 MG tablet Take 20 mg by mouth daily.   levothyroxine (SYNTHROID) 100 MCG tablet Take 100 mcg by mouth daily.   loratadine (CLARITIN) 10 MG tablet  Take 10 mg by mouth daily.   metoprolol tartrate (LOPRESSOR) 25 MG tablet Take 25 mg by mouth 2 (two) times daily.   montelukast (SINGULAIR) 10 MG tablet Take 10 mg by mouth at bedtime.   Multiple Vitamin (MULTIVITAMIN WITH MINERALS) TABS tablet Take 1 tablet by mouth daily.   nitrofurantoin, macrocrystal-monohydrate, (MACROBID) 100 MG capsule Take 100 mg by mouth daily.   nitroGLYCERIN (NITROSTAT) 0.4 MG SL tablet Place 0.4 mg under the tongue every 5 (five) minutes as needed for chest pain.   tamsulosin (FLOMAX) 0.4 MG CAPS capsule Take 1 capsule by mouth daily.   losartan (COZAAR) 100 MG tablet Take 100 mg by mouth daily. (Patient not taking: Reported on 02/03/2022)   No current facility-administered medications for this visit. (Other)   REVIEW OF SYSTEMS: ROS   Positive for: Neurological, Cardiovascular, Eyes Negative for: Constitutional, Gastrointestinal, Skin, Genitourinary, Musculoskeletal, HENT, Endocrine, Respiratory, Psychiatric, Allergic/Imm, Heme/Lymph Last edited by Kingsley Spittle, COT on 02/03/2022  8:44 AM.     ALLERGIES Allergies  Allergen Reactions   Ciprofloxacin Other (See Comments)    Edema, generalized   Levofloxacin    PAST MEDICAL HISTORY Past Medical History:  Diagnosis Date   A-fib [facility])    Anxiety and depression    Aspiration pneumonia ([place]) 06/27/2019   CAD (coronary  artery disease)    Chronic atrial fibrillation ([place]) 09/06/2019   Epistaxis 06/27/2019   Essential hypertension 04/12/2018   Hypertension 09/11/2019   Hypertensive retinopathy    OU   Hypothyroidism    Left inguinal hernia 08/10/2017   Macular degeneration    Wet OD; Dry OS   Permanent atrial fibrillation ([place]) 01/14/2019   Postoperative examination 11/29/2017   Preoperative cardiovascular examination 09/26/2017   Presence of Watchman left atrial appendage closure device 02/04/2020   PVC (premature ventricular contraction)    Sinus bradycardia    Vitamin D deficiency    Past Surgical  History:  Procedure Laterality Date   CATARACT EXTRACTION Bilateral    [facility] [place]   EYE SURGERY Bilateral    Cat Sx   HERNIA REPAIR     FAMILY HISTORY Family History  Problem Relation Age of Onset   Cancer Father    Heart attack Father    Heart disease Brother    Diabetes Brother    SOCIAL HISTORY Social History   Tobacco Use   Smoking status: Former    Types: Cigarettes    Quit date: 1983    Years since quitting: 40.9   Smokeless tobacco: Never  Vaping Use   Vaping Use: Never used  Substance Use Topics   Alcohol use: Yes    Alcohol/week: 1.0 standard drink of alcohol    Types: 1 Cans of beer per week    Comment: "every once in a blue moon"   Drug use: No       OPHTHALMIC EXAM: Base Eye Exam     Visual Acuity (Snellen - Linear)       Right Left   Dist cc 20/100 -2 20/50 +2   Dist ph cc NI NI    Correction: Glasses         Tonometry (Tonopen, 8:57 AM)       Right Left   Pressure 10 10         Pupils       Dark Light Shape React APD   Right 4 4 Round NR None   Left 2 1 Round Minimal None         Visual Fields (Counting fingers)       Left Right    Full Full         Extraocular Movement       Right Left    Full, Ortho Full, Ortho         Neuro/Psych     Oriented x3: Yes   Mood/Affect: Normal         Dilation     Both eyes: 1.0% Mydriacyl, 2.5% Phenylephrine @ 8:58 AM           Slit Lamp and Fundus Exam     Slit Lamp Exam       Right Left   Lids/Lashes Dermatochalasis - upper lid, +hooding Dermatochalasis - upper lid, Meibomian gland dysfunction   Conjunctiva/Sclera White and quiet White and quiet   Cornea 3+4+ Punctate epithelial erosions, tear film debris, well healed cataract wound 3+4 Punctate epithelial erosions, irregular epi, tear film debris   Anterior Chamber Deep and quiet Deep and quiet   Iris Round and moderately dilated Round and poorly dilated to 4.46m    Lens PC IOL in good position with open PC PC IOL in good position with open PC   Anterior Vitreous Vitreous syneresis Vitreous syneresis  Fundus Exam       Right Left   Disc mild Pallor, Sharp rim, peripapillary CNV with mild cystic changes ST quad, no heme mild Pallor, Sharp rim; peripapillary CNV with mild cystic changes overlying, no heme   C/D Ratio 0.4 0.4   Macula Flat, Blunted foveal reflex, +peripapillary heme (resolved) and  exudate -- stably improved, central VMT with cystic changes - persistent Flat, Blunted foveal reflex, fine Drusen, mild Retinal pigment epithelial mottling, no heme, focal edema nasal mac--improved, peripapillary CNV with overlying cystic changes, central cystic changes (VMT)   Vessels attenuated, Tortuous attenuated, Tortuous   Periphery Attached, mild RPE changes, No heme Attached, no heme, mild RPE changes           Refraction     Wearing Rx       Sphere Cylinder Axis Add   Right -3.00 +3.00 005 +2.75   Left -1.50 +3.00 167 +2.75         Manifest Refraction       Sphere Cylinder Axis Dist VA   Right -3.00 +3.25 005 20/80-2   Left -2.00 +3.00 160 20/40           IMAGING AND PROCEDURES  Imaging and Procedures for _0 @  OCT, Retina - OU - Both Eyes       Right Eye Quality was borderline. Central Foveal Thickness: 352. Progression has worsened. Findings include no SRF, abnormal foveal contour, retinal drusen , subretinal hyper-reflective material, intraretinal hyper-reflective material, epiretinal membrane, intraretinal fluid, pigment epithelial detachment, vitreous traction, outer retinal atrophy (Persistent central VMT with cystic changes -- stable; peripapillary CNV with interval increase in IRF).   Left Eye Quality was poor. Central Foveal Thickness: 284. Progression has worsened. Findings include no SRF, abnormal foveal contour, retinal drusen , subretinal hyper-reflective material, intraretinal fluid, vitreous traction  (+VMT with persistent cystic changes; central SRHM (vitelliform lesion); peripapillary SRHM with interval re-development of cystic changes overlying).   Notes *Images captured and stored on drive  Diagnosis / Impression:  OD: exudative ARMD -- peripapillary CNV with interval increase in IRF; Persistent central VMT with cystic changes -- stable; OS: exudative ARMD -- peripapillary SRHM with interval re-development of cystic changes overlying; +VMT with persistent cystic changes; central SRHM (vitelliform lesion);  Clinical management: See below  Abbreviations: NFP - Normal foveal profile. CME - cystoid macular edema. PED - pigment epithelial detachment. IRF - intraretinal fluid. SRF - subretinal fluid. EZ - ellipsoid zone. ERM - epiretinal membrane. ORA - outer retinal atrophy. ORT - outer retinal tubulation. SRHM - subretinal hyper-reflective material      Intravitreal Injection, Pharmacologic Agent - OD - Right Eye       Time Out 02/03/2022. 10:00 AM. Confirmed correct patient, procedure, site, and patient consented.   Anesthesia Topical anesthesia was used. Anesthetic medications included Lidocaine 2%, Proparacaine 0.5%.   Procedure Preparation included 5% betadine to ocular surface, eyelid speculum. A (32g) needle was used.   Injection: 2 mg aflibercept 2 MG/0.05ML   Route: Intravitreal, Site: Right Eye   NDC: A3590391, Lot: [id number], Expiration date: 04/21/2023, Waste: 0 mL   Post-op Post injection exam found visual acuity of at least counting fingers. The patient tolerated the procedure well. There were no complications. The patient received written and verbal post procedure care education. Post injection medications were not given.      Intravitreal Injection, Pharmacologic Agent - OS - Left Eye       Time Out 02/03/2022. 10:01 AM. Confirmed correct patient, procedure,  site, and patient consented.   Anesthesia Topical anesthesia was used. Anesthetic medications  included Lidocaine 2%, Proparacaine 0.5%.   Procedure Preparation included 5% betadine to ocular surface, eyelid speculum. A (32g) needle was used.   Injection: 2 mg aflibercept 2 MG/0.05ML   Route: Intravitreal, Site: Left Eye   NDC: A3590391, Lot: [id number], Expiration date: 04/21/2023, Waste: 0 mL   Post-op Post injection exam found visual acuity of at least counting fingers. The patient tolerated the procedure well. There were no complications. The patient received written and verbal post procedure care education. Post injection medications were not given.            ASSESSMENT/PLAN:   ICD-10-CM   1. Exudative age-related macular degeneration of both eyes with active choroidal neovascularization (HCC)  H35.3231 OCT, Retina - OU - Both Eyes    Intravitreal Injection, Pharmacologic Agent - OD - Right Eye    Intravitreal Injection, Pharmacologic Agent - OS - Left Eye    aflibercept (EYLEA) SOLN 2 mg    aflibercept (EYLEA) SOLN 2 mg    2. Vitreomacular adhesion of both eyes  H43.823     3. Essential hypertension  I10     4. Hypertensive retinopathy of both eyes  H35.033     5. Pseudophakia of both eyes  Z96.1     6. Dry eyes  H04.123      1. Exudative age related macular degeneration, both eyes  - pt delayed to follow up from 12 weeks to 6 months due to emergency cholecystomy right before his last appt  - OD incidental discovery of peripapillary CNV w/ heme and SRF during emergency visit for corneal abrasion OS  - OS Interval conversion of Exudative ARMD noted on 09.22.22 visit (peripapillary CNV w/ SRF)             - S/P IVA OD #1 (03.22.21), #2 (04.19.21), #3 (05.26.21), #4 (06.23.21) -- IVA resistance  - S/P IVE OD #1 sample (07.22.21), #2 (08.24.21), #3 (10.13.21), #4 (11.18.21), #5 (12.30.21), #6 (02.24.22), #7 (04.21.22), #8 (06.30.22), #9 (09.22.22), #10 (12.20.22), #11 (01.31.23), #12 (03.29.23), #13 (06.08.23) - s/p IVE OS #1 (09.22.22), #2 (10.21.22), #3  (12.20.22), #4 (01.31.23), #5 (03.29.23), #6 (06.08.23) - s/p IVA OS #1 (11.22.22)  - BCVA OD stable at 20/100; OS stable at 20/50 -- decreased OU  - OCT OD: exudative ARMD -- peripapillary CNV with interval increase in IRF; Persistent central VMT with cystic changes -- stable; OS: exudative ARMD -- peripapillary SRHM with interval re-development of cystic changes overlying; +VMT with persistent cystic changes; central SRHM (vitelliform lesion); at 6 months - recommend IVE OU -- OD #14 and OS #7 today, 12.14.23 with follow up back to 6-8 weeks  - pt wishes to proceed with injections  - RBA of procedure discussed, questions answered  - IVE informed consent obtained, and re-signed, 12.14.23 OU  - Avastin informed consent obtained, signed and scanned, 03.22.21  - see procedure note  - Eylea4U benefits investigation started 07.22.21 -- approved for 2023  - f/u in 6-8 weeks -- DFE, OCT, possible injxns -- tx and ext as able  2. VMT OU  - mild -- OCT stable OU  - review of OCTs since initial visit shows progressive traction -- hopefully will release uneventfully soon  - monitor   3,4. Hypertensive retinopathy OU  - discussed importance of tight BP control  - monitor   5. Pseudophakia OU  - s/p CE/IOL OU  - IOLs in good position, doing  well  - monitor   6. Dry eyes OU - recommend artificial tears and lubricating ointment as needed   Ophthalmic Meds Ordered this visit:  Meds ordered this encounter  Medications   aflibercept (EYLEA) SOLN 2 mg   aflibercept (EYLEA) SOLN 2 mg     Return for f/u 6-8 weeks, exu ARMD OU, DFE, OCT.  There are no Patient Instructions on file for this visit.  Explained the diagnoses, plan, and follow up with the patient and they expressed understanding.  Patient expressed understanding of the importance of proper follow up care.   This document serves as a record of services personally performed by Gardiner Sleeper, MD, PhD. It was created on their behalf by  San Jetty. Owens Shark, OA an ophthalmic technician. The creation of this record is the provider's dictation and/or activities during the visit.    Electronically signed by: San Jetty. Owens Shark, [place] 12.12.2023 11:06 AM  Gardiner Sleeper, M.D., Ph.D. Diseases & Surgery of the Retina and Vitreous [facility]  I have reviewed the above documentation for accuracy and completeness, and I agree with the above. Gardiner Sleeper, M.D., Ph.D. 02/03/22 11:06 AM   Abbreviations: M myopia (nearsighted); A astigmatism; H hyperopia (farsighted); P presbyopia; Mrx spectacle prescription;  CTL contact lenses; OD right eye; OS left eye; OU both eyes  XT exotropia; ET esotropia; PEK punctate epithelial keratitis; PEE punctate epithelial erosions; DES dry eye syndrome; MGD meibomian gland dysfunction; ATs artificial tears; PFAT's preservative free artificial tears; [place] nuclear sclerotic cataract; PSC posterior subcapsular cataract; ERM epi-retinal membrane; PVD posterior vitreous detachment; RD retinal detachment; DM diabetes mellitus; DR diabetic retinopathy; NPDR non-proliferative diabetic retinopathy; PDR proliferative diabetic retinopathy; CSME clinically significant macular edema; DME diabetic macular edema; dbh dot blot hemorrhages; CWS cotton wool spot; POAG primary open angle glaucoma; C/D cup-to-disc ratio; HVF humphrey visual field; GVF goldmann visual field; OCT optical coherence tomography; IOP intraocular pressure; BRVO Branch retinal vein occlusion; CRVO central retinal vein occlusion; CRAO central retinal artery occlusion; BRAO branch retinal artery occlusion; RT retinal tear; SB scleral buckle; PPV pars plana vitrectomy; VH Vitreous hemorrhage; PRP panretinal laser photocoagulation; IVK intravitreal kenalog; VMT vitreomacular traction; MH Macular hole;  NVD neovascularization of the disc; NVE neovascularization elsewhere; AREDS age related eye disease study; ARMD age related macular degeneration;  POAG primary open angle glaucoma; EBMD epithelial/anterior basement membrane dystrophy; ACIOL anterior chamber intraocular lens; IOL intraocular lens; PCIOL posterior chamber intraocular lens; Phaco/IOL phacoemulsification with intraocular lens placement; [place] photorefractive keratectomy; LASIK laser assisted in situ keratomileusis; HTN hypertension; DM diabetes mellitus; COPD chronic obstructive pulmonary disease

## 2022-02-03 ENCOUNTER — Ambulatory Visit (INDEPENDENT_AMBULATORY_CARE_PROVIDER_SITE_OTHER): Payer: Medicare Other | Admitting: Ophthalmology

## 2022-02-03 ENCOUNTER — Encounter (INDEPENDENT_AMBULATORY_CARE_PROVIDER_SITE_OTHER): Payer: Self-pay | Admitting: Ophthalmology

## 2022-02-03 DIAGNOSIS — H04123 Dry eye syndrome of bilateral lacrimal glands: Secondary | ICD-10-CM

## 2022-02-03 DIAGNOSIS — H35033 Hypertensive retinopathy, bilateral: Secondary | ICD-10-CM

## 2022-02-03 DIAGNOSIS — I10 Essential (primary) hypertension: Secondary | ICD-10-CM | POA: Diagnosis not present

## 2022-02-03 DIAGNOSIS — H353231 Exudative age-related macular degeneration, bilateral, with active choroidal neovascularization: Secondary | ICD-10-CM

## 2022-02-03 DIAGNOSIS — H43823 Vitreomacular adhesion, bilateral: Secondary | ICD-10-CM

## 2022-02-03 DIAGNOSIS — Z961 Presence of intraocular lens: Secondary | ICD-10-CM

## 2022-02-03 MED ORDER — AFLIBERCEPT 2MG/0.05ML IZ SOLN FOR KALEIDOSCOPE
2.0000 mg | INTRAVITREAL | Status: AC | PRN
  Administered 2022-02-03: 2 mg via INTRAVITREAL

## 2022-03-03 NOTE — Progress Notes (Signed)
[facility] Note  03/17/2022     CHIEF COMPLAINT Patient presents for Retina Follow Up   HISTORY OF PRESENT ILLNESS: Eddie Kelly is a 89 y.o. male who presents to the clinic today for:   HPI     Retina Follow Up   Patient presents with  Dry AMD.  In both eyes.  This started 6 weeks ago.  Duration of 6 weeks.  Since onset it is stable.  I, the attending physician,  performed the HPI with the patient and updated documentation appropriately.        Comments   Retina follow up ARMD OU and I'VE OU pt is reporting no vision changes noticed he has noticed his left eye is photo phobic in the morning when he wakes up       Last edited by Bernarda Caffey, MD on 03/17/2022 11:07 AM.    Pt states light hurts his eyes every morning when he wakes up, he uses drops, but feel like they turn crusty and don't really help with his dryness   Referring physician: Nicholos Johns, MD [place],  [place] [id number]  HISTORICAL INFORMATION:   Selected notes from the MEDICAL RECORD NUMBER Referred by Dr. Shirleen Schirmer for concern of CNVM OD LEE: 05/04/2019 BCVA: 20/70-1            20/50+1   CURRENT MEDICATIONS: No current outpatient medications on file. (Ophthalmic Drugs)   No current facility-administered medications for this visit. (Ophthalmic Drugs)   Current Outpatient Medications (Other)  Medication Sig   aspirin EC 81 MG tablet Take 81 mg by mouth daily.   cyanocobalamin (,VITAMIN B-12,) 1000 MCG/ML injection Inject 1,000 mcg into the muscle every 30 (thirty) days.   diclofenac Sodium (VOLTAREN) 1 % GEL Apply 1 application topically 3 (three) times daily as needed for pain.   furosemide (LASIX) 20 MG tablet Take 20 mg by mouth daily.   levothyroxine (SYNTHROID) 100 MCG tablet Take 100 mcg by mouth daily.   loratadine (CLARITIN) 10 MG tablet Take 10 mg by mouth daily.   losartan (COZAAR) 100 MG tablet Take 100 mg by mouth daily. (Patient not  taking: Reported on 02/03/2022)   metoprolol tartrate (LOPRESSOR) 25 MG tablet Take 25 mg by mouth 2 (two) times daily.   montelukast (SINGULAIR) 10 MG tablet Take 10 mg by mouth at bedtime.   Multiple Vitamin (MULTIVITAMIN WITH MINERALS) TABS tablet Take 1 tablet by mouth daily.   nitrofurantoin, macrocrystal-monohydrate, (MACROBID) 100 MG capsule Take 100 mg by mouth daily.   nitroGLYCERIN (NITROSTAT) 0.4 MG SL tablet Place 0.4 mg under the tongue every 5 (five) minutes as needed for chest pain.   tamsulosin (FLOMAX) 0.4 MG CAPS capsule Take 1 capsule by mouth daily.   No current facility-administered medications for this visit. (Other)   REVIEW OF SYSTEMS: ROS   Positive for: Neurological, Cardiovascular, Eyes Negative for: Constitutional, Gastrointestinal, Skin, Genitourinary, Musculoskeletal, HENT, Endocrine, Respiratory, Psychiatric, Allergic/Imm, Heme/Lymph Last edited by Parthenia Ames, COT on 03/17/2022  8:40 AM.     ALLERGIES Allergies  Allergen Reactions   Ciprofloxacin Other (See Comments)    Edema, generalized   Levofloxacin    PAST MEDICAL HISTORY Past Medical History:  Diagnosis Date   A-fib [facility])    Anxiety and depression    Aspiration pneumonia ([place]) 06/27/2019   CAD (coronary artery disease)    Chronic atrial fibrillation ([place]) 09/06/2019   Epistaxis 06/27/2019  Essential hypertension 04/12/2018   Hypertension 09/11/2019   Hypertensive retinopathy    OU   Hypothyroidism    Left inguinal hernia 08/10/2017   Macular degeneration    Wet OD; Dry OS   Permanent atrial fibrillation ([place]) 01/14/2019   Postoperative examination 11/29/2017   Preoperative cardiovascular examination 09/26/2017   Presence of Watchman left atrial appendage closure device 02/04/2020   PVC (premature ventricular contraction)    Sinus bradycardia    Vitamin D deficiency    Past Surgical History:  Procedure Laterality Date   CATARACT EXTRACTION Bilateral    [facility], [place]   EYE SURGERY Bilateral    Cat Sx   HERNIA REPAIR     FAMILY HISTORY Family History  Problem Relation Age of Onset   Cancer Father    Heart attack Father    Heart disease Brother    Diabetes Brother    SOCIAL HISTORY Social History   Tobacco Use   Smoking status: Former    Types: Cigarettes    Quit date: 1983    Years since quitting: 41.0   Smokeless tobacco: Never  Vaping Use   Vaping Use: Never used  Substance Use Topics   Alcohol use: Yes    Alcohol/week: 1.0 standard drink of alcohol    Types: 1 Cans of beer per week    Comment: "every once in a blue moon"   Drug use: No       OPHTHALMIC EXAM: Base Eye Exam     Visual Acuity (Snellen - Linear)       Right Left   Dist cc 20/100 -2 20/40 -3   Dist ph cc NI NI    Correction: Contacts         Tonometry (Tonopen, 8:44 AM)       Right Left   Pressure 8 8         Pupils       Pupils Dark Light Shape React APD   Right PERRL 4 4 Round NR None   Left PERRL 2 1 Round Slow None         Visual Fields       Left Right    Full Full         Extraocular Movement       Right Left    Full, Ortho Full, Ortho         Neuro/Psych     Oriented x3: Yes   Mood/Affect: Normal         Dilation     Both eyes: 2.5% Phenylephrine @ 8:44 AM           Slit Lamp and Fundus Exam     Slit Lamp Exam       Right Left   Lids/Lashes Dermatochalasis - upper lid, +hooding Dermatochalasis - upper lid, Meibomian gland dysfunction   Conjunctiva/Sclera White and quiet White and quiet   Cornea 3+ Punctate epithelial erosions, tear film debris, well healed cataract wound 3+Punctate epithelial erosions, irregular epi, tear film debris, decreased TBUT   Anterior Chamber deep and clear deep and clear   Iris Round and moderately dilated Round and poorly dilated to 4.63m   Lens PC IOL in good position with open PC PC IOL in good position with open PC   Anterior Vitreous  Vitreous syneresis Vitreous syneresis         Fundus Exam       Right Left   Disc  mild Pallor, Sharp rim, peripapillary CNV with mild cystic changes ST disc, no heme mild Pallor, Sharp rim; peripapillary CNV with mild cystic changes overlying - improved, no heme   C/D Ratio 0.4 0.4   Macula Flat, Blunted foveal reflex, +peripapillary heme (resolved) and  exudate -- stably improved, drusen, central VMT with cystic changes - persistent Flat, Blunted foveal reflex, fine Drusen, mild Retinal pigment epithelial mottling, no heme, focal edema nasal mac--improved, peripapillary CNV with overlying cystic changes - improved, central cystic changes (VMT)   Vessels attenuated, Tortuous attenuated, Tortuous   Periphery Attached, mild RPE changes, No heme Attached, no heme, mild RPE changes           Refraction     Wearing Rx       Sphere Cylinder Axis Add   Right -3.00 +3.00 005 +2.75   Left -1.50 +3.00 167 +2.75           IMAGING AND PROCEDURES  Imaging and Procedures for @TODAY @  OCT, Retina - OU - Both Eyes       Right Eye Quality was borderline. Central Foveal Thickness: 359. Progression has improved. Findings include no SRF, abnormal foveal contour, retinal drusen , subretinal hyper-reflective material, intraretinal hyper-reflective material, epiretinal membrane, intraretinal fluid, pigment epithelial detachment, vitreous traction, outer retinal atrophy (Persistent central VMT with cystic changes -- stable; peripapillary CNV with interval improvement in IRF/SRHM).   Left Eye Quality was borderline. Central Foveal Thickness: 292. Progression has improved. Findings include no SRF, abnormal foveal contour, retinal drusen , subretinal hyper-reflective material, intraretinal fluid, vitreous traction (+VMT with persistent cystic changes; central SRHM (vitelliform lesion); peripapillary SRHM with interval improvement in cystic changes overlying).   Notes *Images captured and stored on  drive  Diagnosis / Impression:  OD: exudative ARMD -- Persistent central VMT with cystic changes -- stable; peripapillary CNV with interval improvement in IRF/SRHM OS: exudative ARMD -- +VMT with persistent cystic changes; central SRHM (vitelliform lesion); peripapillary SRHM with interval improvement in cystic changes overlying  Clinical management: See below  Abbreviations: NFP - Normal foveal profile. CME - cystoid macular edema. PED - pigment epithelial detachment. IRF - intraretinal fluid. SRF - subretinal fluid. EZ - ellipsoid zone. ERM - epiretinal membrane. ORA - outer retinal atrophy. ORT - outer retinal tubulation. SRHM - subretinal hyper-reflective material      Intravitreal Injection, Pharmacologic Agent - OD - Right Eye       Time Out 03/17/2022. 9:35 AM. Confirmed correct patient, procedure, site, and patient consented.   Anesthesia Topical anesthesia was used. Anesthetic medications included Lidocaine 2%, Proparacaine 0.5%.   Procedure Preparation included 5% betadine to ocular surface, eyelid speculum. A (32g) needle was used.   Injection: 2 mg aflibercept 2 MG/0.05ML   Route: Intravitreal, Site: Right Eye   NDC: [id number], Lot: [id number], Expiration date: 06/21/2022, Waste: 0 mL   Post-op Post injection exam found visual acuity of at least counting fingers. The patient tolerated the procedure well. There were no complications. The patient received written and verbal post procedure care education. Post injection medications were not given.            ASSESSMENT/PLAN:   ICD-10-CM   1. Exudative age-related macular degeneration of both eyes with active choroidal neovascularization (HCC)  H35.3231 OCT, Retina - OU - Both Eyes    Intravitreal Injection, Pharmacologic Agent - OD - Right Eye    aflibercept (EYLEA) SOLN 2 mg    CANCELED: Intravitreal Injection, Pharmacologic Agent - OS - Left Eye  2. Vitreomacular adhesion of both eyes  H43.823     3.  Essential hypertension  I10     4. Hypertensive retinopathy of both eyes  H35.033     5. Pseudophakia of both eyes  Z96.1     6. Dry eyes  H04.123      1. Exudative age related macular degeneration, both eyes  - pt delayed to follow up from 12 weeks to 6 months due to emergency cholecystomy right before his last appt (June 2023 to Dec 2023)  - OD incidental discovery of peripapillary CNV w/ heme and SRF during emergency visit for corneal abrasion OS  - OS Interval conversion of Exudative ARMD noted on 09.22.22 visit (peripapillary CNV w/ SRF)             - S/P IVA OD #1 (03.22.21), #2 (04.19.21), #3 (05.26.21), #4 (06.23.21) -- IVA resistance  - S/P IVE OD #1 sample (07.22.21), #2 (08.24.21), #3 (10.13.21), #4 (11.18.21), #5 (12.30.21), #6 (02.24.22), #7 (04.21.22), #8 (06.30.22), #9 (09.22.22), #10 (12.20.22), #11 (01.31.23), #12 (03.29.23), #13 (06.08.23), #14 (12.14.23) - s/p IVA OS #1 (11.22.22) - s/p IVE OS #1 (09.22.22), #2 (10.21.22), #3 (12.20.22), #4 (01.31.23), #5 (03.29.23), #6 (06.08.23), #7 (12.14.23)  - BCVA OD 20/100 - stable; OS 20/40 - improved  - OCT shows OD: exudative ARMD -- Persistent central VMT with cystic changes -- stable; peripapillary CNV with interval improvement in IRF/SRHM; OS: exudative ARMD -- +VMT with persistent cystic changes; central SRHM (vitelliform lesion); peripapillary SRHM with interval improvement in cystic changes overlying at 6 wks - recommend IVE OD #15 today, 01.25.24 with follow up extended to 7 weeks  - will hold off on OS today - pt wishes to proceed with injections  - RBA of procedure discussed, questions answered  - IVE informed consent obtained, and re-signed, 12.14.23 OU  - Avastin informed consent obtained, signed and scanned, 03.22.21  - see procedure note  - Eylea4U benefits investigation started 07.22.21 -- approved for 2023  - f/u in 7 weeks -- DFE, OCT, possible injxns -- tx and ext as able  2. VMT OU  - mild -- OCT stable  OU  - review of OCTs since initial visit shows progressive traction -- hopefully will release uneventfully soon  - monitor   3,4. Hypertensive retinopathy OU  - discussed importance of tight BP control  - monitor   5. Pseudophakia OU  - s/p CE/IOL OU  - IOLs in good position, doing well  - monitor   6. Dry eyes OU - recommend artificial tears and lubricating ointment as needed   Ophthalmic Meds Ordered this visit:  Meds ordered this encounter  Medications   aflibercept (EYLEA) SOLN 2 mg     Return in about 7 weeks (around 05/05/2022) for f/u exu ARMD OU, DFE, OCT.  There are no Patient Instructions on file for this visit.  Explained the diagnoses, plan, and follow up with the patient and they expressed understanding.  Patient expressed understanding of the importance of proper follow up care.   This document serves as a record of services personally performed by Gardiner Sleeper, MD, PhD. It was created on their behalf by San Jetty. Owens Shark, OA an ophthalmic technician. The creation of this record is the provider's dictation and/or activities during the visit.    Electronically signed by: San Jetty. [place], [place] 01.11.2024 2:16 AM  Gardiner Sleeper, M.D., Ph.D. Diseases & Surgery of the Retina and [place]  I have reviewed the above documentation for accuracy and completeness, and I agree with the above. Gardiner Sleeper, M.D., Ph.D. 03/18/22 2:20 AM  Abbreviations: M myopia (nearsighted); A astigmatism; H hyperopia (farsighted); P presbyopia; Mrx spectacle prescription;  CTL contact lenses; OD right eye; OS left eye; OU both eyes  XT exotropia; ET esotropia; PEK punctate epithelial keratitis; PEE punctate epithelial erosions; DES dry eye syndrome; MGD meibomian gland dysfunction; ATs artificial tears; PFAT's preservative free artificial tears; [place] nuclear sclerotic cataract; PSC posterior subcapsular cataract; ERM epi-retinal membrane; PVD posterior  vitreous detachment; RD retinal detachment; DM diabetes mellitus; DR diabetic retinopathy; NPDR non-proliferative diabetic retinopathy; PDR proliferative diabetic retinopathy; CSME clinically significant macular edema; DME diabetic macular edema; dbh dot blot hemorrhages; CWS cotton wool spot; POAG primary open angle glaucoma; C/D cup-to-disc ratio; HVF humphrey visual field; GVF goldmann visual field; OCT optical coherence tomography; IOP intraocular pressure; BRVO Branch retinal vein occlusion; CRVO central retinal vein occlusion; CRAO central retinal artery occlusion; BRAO branch retinal artery occlusion; RT retinal tear; SB scleral buckle; PPV pars plana vitrectomy; VH Vitreous hemorrhage; PRP panretinal laser photocoagulation; IVK intravitreal kenalog; VMT vitreomacular traction; MH Macular hole;  NVD neovascularization of the disc; NVE neovascularization elsewhere; AREDS age related eye disease study; ARMD age related macular degeneration; POAG primary open angle glaucoma; EBMD epithelial/anterior basement membrane dystrophy; ACIOL anterior chamber intraocular lens; IOL intraocular lens; PCIOL posterior chamber intraocular lens; Phaco/IOL phacoemulsification with intraocular lens placement; [place] photorefractive keratectomy; LASIK laser assisted in situ keratomileusis; HTN hypertension; DM diabetes mellitus; COPD chronic obstructive pulmonary disease

## 2022-03-10 ENCOUNTER — Ambulatory Visit: Payer: Medicare Other | Admitting: Gastroenterology

## 2022-03-17 ENCOUNTER — Encounter (INDEPENDENT_AMBULATORY_CARE_PROVIDER_SITE_OTHER): Payer: Self-pay | Admitting: Ophthalmology

## 2022-03-17 ENCOUNTER — Ambulatory Visit (INDEPENDENT_AMBULATORY_CARE_PROVIDER_SITE_OTHER): Payer: Medicare Other | Admitting: Ophthalmology

## 2022-03-17 DIAGNOSIS — I10 Essential (primary) hypertension: Secondary | ICD-10-CM | POA: Diagnosis not present

## 2022-03-17 DIAGNOSIS — H353231 Exudative age-related macular degeneration, bilateral, with active choroidal neovascularization: Secondary | ICD-10-CM

## 2022-03-17 DIAGNOSIS — Z961 Presence of intraocular lens: Secondary | ICD-10-CM

## 2022-03-17 DIAGNOSIS — H43823 Vitreomacular adhesion, bilateral: Secondary | ICD-10-CM | POA: Diagnosis not present

## 2022-03-17 DIAGNOSIS — H35033 Hypertensive retinopathy, bilateral: Secondary | ICD-10-CM

## 2022-03-17 DIAGNOSIS — H04123 Dry eye syndrome of bilateral lacrimal glands: Secondary | ICD-10-CM

## 2022-03-17 MED ORDER — AFLIBERCEPT 2MG/0.05ML IZ SOLN FOR KALEIDOSCOPE
2.0000 mg | INTRAVITREAL | Status: AC | PRN
  Administered 2022-03-17: 2 mg via INTRAVITREAL

## 2022-04-07 ENCOUNTER — Encounter: Payer: Self-pay | Admitting: Gastroenterology

## 2022-04-07 ENCOUNTER — Ambulatory Visit: Payer: Medicare Other | Admitting: Gastroenterology

## 2022-04-07 VITALS — BP 128/68 | HR 74 | Ht 72.0 in | Wt 178.5 lb

## 2022-04-07 DIAGNOSIS — R159 Full incontinence of feces: Secondary | ICD-10-CM

## 2022-04-07 NOTE — Progress Notes (Signed)
Chief Complaint: Rectal incontinence  Referring Provider:  Nicholos Johns, MD      ASSESSMENT AND PLAN;   #1. Rectal incontinence after GB Sx (resolved).  #2. Rectal bleeding d/t hemorrhoids (resolved)  Plan: -Hold off any GI WU at pt's request since he feels significantly better and back to baseline.  I do agree. -Continue using HC 2.5% BID PRN. -If any further diarrhea, trial of cholestyramine. -If any problems, then would consider FS vs colon -Discussed extensively with pt and pt's daughter (who is a patient of ours).  They have all her contact numbers.  They will get in touch with [place] promptly in case of any further problems.   HPI:    Eddie Kelly is a 89 y.o. male  A fib s/p Watchman (off Xeralto) on bASA, multiple comorbidities as listed below Accompanied by his daughter  Had cholecystectomy August 2023 with resultant diarrhea and incontinence. This is much better over the last 2 weeks. Now he is back to baseline-normal bowel movements Initially had rectal bleeding, rectal exam by Dr Rica Records revealed moderate hemorrhoids, treated with HC 2.5% twice daily also with good results.  No further rectal bleeding.  His labs including CBC was normal with Hb 12.6 (at baseline) 01/11/2022. Nl CMP except for alk phos 177.  Denies having any abdominal pain.  No upper GI symptoms  Would like to hold off on any endoscopic procedures.  He feels much better.  He came with his daughter since he had appointment.  He does have urinary problems and had urinary retention after cholecystectomy requiring catheter.  He is being followed by Dr. Nila Nephew.  Past GI procedures: -Colonoscopy by Dr. Lyda Jester 11/30/2012: neg. No need to repeat.   Past Medical History:  Diagnosis Date   A-fib [facility])    Anxiety and depression    Aspiration pneumonia ([place]) 06/27/2019   CAD (coronary artery disease)    Chronic atrial fibrillation ([place]) 09/06/2019   Epistaxis 06/27/2019   Essential hypertension  04/12/2018   Hypertension 09/11/2019   Hypertensive retinopathy    OU   Hypothyroidism    Left inguinal hernia 08/10/2017   Macular degeneration    Wet OD; Dry OS   Permanent atrial fibrillation ([place]) 01/14/2019   Postoperative examination 11/29/2017   Preoperative cardiovascular examination 09/26/2017   Presence of Watchman left atrial appendage closure device 02/04/2020   PVC (premature ventricular contraction)    Sinus bradycardia    Vitamin D deficiency     Past Surgical History:  Procedure Laterality Date   CATARACT EXTRACTION Bilateral    [facility], [place]   EYE SURGERY Bilateral    Cat Sx   HERNIA REPAIR      Family History  Problem Relation Age of Onset   Cancer Father    Heart attack Father    Colon cancer Father    Heart disease Brother    Diabetes Brother    Rectal cancer Neg Hx    Stomach cancer Neg Hx     Social History   Tobacco Use   Smoking status: Former    Types: Cigarettes    Quit date: 1983    Years since quitting: 41.1   Smokeless tobacco: Never  Vaping Use   Vaping Use: Never used  Substance Use Topics   Alcohol use: Not Currently    Alcohol/week: 1.0 standard drink of alcohol    Types: 1 Cans of beer per week    Comment: "every  once in a blue moon"   Drug use: No    Current Outpatient Medications  Medication Sig Dispense Refill   aspirin EC 81 MG tablet Take 81 mg by mouth daily.     bethanechol (URECHOLINE) 25 MG tablet Take 25 mg by mouth 2 (two) times daily.     cyanocobalamin (,VITAMIN B-12,) 1000 MCG/ML injection Inject 1,000 mcg into the muscle every 30 (thirty) days.     diclofenac Sodium (VOLTAREN) 1 % GEL Apply 1 application topically 3 (three) times daily as needed for pain.     ferrous sulfate 325 (65 FE) MG EC tablet Take 325 mg by mouth 3 (three) times daily with meals.     finasteride (PROSCAR) 5 MG tablet Take 5 mg by mouth daily.     furosemide (LASIX) 20 MG tablet Take 20 mg by mouth  daily.     levothyroxine (SYNTHROID) 100 MCG tablet Take 100 mcg by mouth daily.     loratadine (CLARITIN) 10 MG tablet Take 10 mg by mouth daily.     memantine (NAMENDA) 10 MG tablet Take 10 mg by mouth daily.     metoprolol tartrate (LOPRESSOR) 25 MG tablet Take 25 mg by mouth 2 (two) times daily.     montelukast (SINGULAIR) 10 MG tablet Take 10 mg by mouth at bedtime.     Multiple Vitamin (MULTIVITAMIN WITH MINERALS) TABS tablet Take 1 tablet by mouth daily.     nitroGLYCERIN (NITROSTAT) 0.4 MG SL tablet Place 0.4 mg under the tongue every 5 (five) minutes as needed for chest pain.     Probiotic Product (PROBIOTIC DAILY PO) Take 2 capsules by mouth daily.     saw palmetto 80 MG capsule Take 80 mg by mouth daily.     tamsulosin (FLOMAX) 0.4 MG CAPS capsule Take 1 capsule by mouth daily.     No current facility-administered medications for this visit.    Allergies  Allergen Reactions   Ciprofloxacin Other (See Comments)    Edema, generalized   Levofloxacin     Review of Systems:  Constitutional: Denies fever, chills, diaphoresis, appetite change and fatigue.  HEENT: Denies photophobia, eye pain, redness, hearing loss, ear pain, congestion, sore throat, rhinorrhea, sneezing, mouth sores, neck pain, neck stiffness and tinnitus.   Respiratory: Denies SOB, DOE, cough, chest tightness,  and wheezing.   Cardiovascular: Denies chest pain, palpitations and leg swelling.  Genitourinary: Has hesitancy, urinary problems. Musculoskeletal: Denies myalgias, back pain, joint swelling, arthralgias and gait problem.  Skin: No rash.  Neurological: Denies dizziness, seizures, syncope, weakness, light-headedness, numbness and headaches.  Hematological: Denies adenopathy. Easy bruising, personal or family bleeding history  Psychiatric/Behavioral: No anxiety or depression     Physical Exam:    BP 128/68   Pulse 74   Ht 6' (1.829 m)   Wt 178 lb 8 oz (81 kg)   SpO2 95%   BMI 24.21 kg/m  Wt  Readings from Last 3 Encounters:  04/07/22 178 lb 8 oz (81 kg)  09/09/20 187 lb (84.8 kg)  02/04/20 186 lb (84.4 kg)   Constitutional:  Well-developed, in no acute distress.  Elderly male Psychiatric: Normal mood and affect. Behavior is normal. HEENT: Conjunctivae are normal. No scleral icterus.  Cardiovascular: Normal rate, regular rhythm. No edema Pulmonary/chest: Effort normal and breath sounds normal. No wheezing, rales or rhonchi. Abdominal: Soft, nondistended. Nontender. Bowel sounds active throughout. There are no masses palpable. No hepatomegaly. Rectal: Deferred at patient's request. Neurological: Alert and oriented to person  place and time. Skin: Skin is warm and dry. No rashes noted.  Data Reviewed: I have personally reviewed following labs and imaging studies  CBC:    Latest Ref Rng & Units 01/14/2019   10:55 AM  CBC  WBC 3.4 - 10.8 x10E3/uL 5.9   Hemoglobin 13.0 - 17.7 g/dL 13.1   Hematocrit 37.5 - 51.0 % 39.3   Platelets 150 - 450 x10E3/uL 170     CMP:    Latest Ref Rng & Units 01/29/2019    9:32 AM 01/14/2019   10:55 AM  CMP  Glucose 65 - 99 mg/dL 92  99   BUN 10 - 36 mg/dL 23  32   Creatinine 0.76 - 1.27 mg/dL 1.12  1.29   Sodium 134 - 144 mmol/L 145  146   Potassium 3.5 - 5.2 mmol/L 5.2  5.4   Chloride 96 - 106 mmol/L 105  106   CO2 20 - 29 mmol/L 23  24   Calcium 8.6 - 10.2 mg/dL 9.6  9.9   Total Protein 6.0 - 8.5 g/dL  6.9   Total Bilirubin 0.0 - 1.2 mg/dL  0.8   Alkaline Phos 39 - 117 IU/L  147   AST 0 - 40 IU/L  18   ALT 0 - 44 IU/L  15        Carmell Austria, MD 04/07/2022, 10:34 AM  Cc: Nicholos Johns, MD

## 2022-04-07 NOTE — Patient Instructions (Signed)
_______________________________________________________  If your blood pressure at your visit was 140/90 or greater, please contact your primary care physician to follow up on this.  _______________________________________________________  If you are age 87 or older, your body mass index should be between 23-30. Your Body mass index is 24.21 kg/m. If this is out of the aforementioned range listed, please consider follow up with your Primary Care Provider.  If you are age 50 or younger, your body mass index should be between 19-25. Your Body mass index is 24.21 kg/m. If this is out of the aformentioned range listed, please consider follow up with your Primary Care Provider.   ________________________________________________________  The [facility] providers would like to encourage you to use [facility] to communicate with providers for non-urgent requests or questions.  Due to long hold times on the telephone, sending your provider a message by [facility] may be a faster and more efficient way to get a response.  Please allow 48 business hours for a response.  Please remember that this is for non-urgent requests.  _______________________________________________________  Please call if symptoms worsen.  Thank you,  Dr. Jackquline Denmark

## 2022-05-05 NOTE — Progress Notes (Signed)
[facility] Note  05/19/2022     CHIEF COMPLAINT Patient presents for Retina Follow Up   HISTORY OF PRESENT ILLNESS: Eddie Kelly is a 89 y.o. male who presents to the clinic today for:   HPI     Retina Follow Up   Patient presents with  Wet AMD.  In both eyes.  This started 7 weeks ago.  I, the attending physician,  performed the HPI with the patient and updated documentation appropriately.        Comments   Patient here for 7 weeks (9 weeks) for retina follow up for exu ARMD OU. Patient states vision about the same. No eye pain. Uses drops. Not as much as should.      Last edited by Bernarda Caffey, MD on 05/19/2022 12:07 PM.     Pt states he got new glasses, but they did not put the prism in them, so he had to send them back    Referring physician: Nicholos Johns, MD [place],  [place] [id number]  HISTORICAL INFORMATION:   Selected notes from the [facility] Referred by Dr. Shirleen Schirmer for concern of CNVM OD LEE: 05/04/2019 BCVA: 20/70-1            20/50+1   CURRENT MEDICATIONS: No current outpatient medications on file. (Ophthalmic Drugs)   No current facility-administered medications for this visit. (Ophthalmic Drugs)   Current Outpatient Medications (Other)  Medication Sig   aspirin EC 81 MG tablet Take 81 mg by mouth daily.   bethanechol (URECHOLINE) 25 MG tablet Take 25 mg by mouth 2 (two) times daily.   cyanocobalamin (,VITAMIN B-12,) 1000 MCG/ML injection Inject 1,000 mcg into the muscle every 30 (thirty) days.   diclofenac Sodium (VOLTAREN) 1 % GEL Apply 1 application topically 3 (three) times daily as needed for pain.   ferrous sulfate 325 (65 FE) MG EC tablet Take 325 mg by mouth 3 (three) times daily with meals.   finasteride (PROSCAR) 5 MG tablet Take 5 mg by mouth daily.   furosemide (LASIX) 20 MG tablet Take 20 mg by mouth daily.   levothyroxine (SYNTHROID) 100 MCG tablet Take 100 mcg by  mouth daily.   loratadine (CLARITIN) 10 MG tablet Take 10 mg by mouth daily.   memantine (NAMENDA) 10 MG tablet Take 10 mg by mouth daily.   metoprolol tartrate (LOPRESSOR) 25 MG tablet Take 25 mg by mouth 2 (two) times daily.   montelukast (SINGULAIR) 10 MG tablet Take 10 mg by mouth at bedtime.   Multiple Vitamin (MULTIVITAMIN WITH MINERALS) TABS tablet Take 1 tablet by mouth daily.   nitroGLYCERIN (NITROSTAT) 0.4 MG SL tablet Place 0.4 mg under the tongue every 5 (five) minutes as needed for chest pain.   Probiotic Product (PROBIOTIC DAILY PO) Take 2 capsules by mouth daily.   saw palmetto 80 MG capsule Take 80 mg by mouth daily.   tamsulosin (FLOMAX) 0.4 MG CAPS capsule Take 1 capsule by mouth daily.   No current facility-administered medications for this visit. (Other)   REVIEW OF SYSTEMS: ROS   Positive for: Neurological, Cardiovascular, Eyes Negative for: Constitutional, Gastrointestinal, Skin, Genitourinary, Musculoskeletal, HENT, Endocrine, Respiratory, Psychiatric, Allergic/Imm, Heme/Lymph Last edited by Theodore Demark, COA on 05/19/2022  8:41 AM.      ALLERGIES Allergies  Allergen Reactions   Ciprofloxacin Other (See Comments)    Edema, generalized   Levofloxacin    PAST MEDICAL HISTORY Past  Medical History:  Diagnosis Date   A-fib [facility])    Anxiety and depression    Aspiration pneumonia ([place]) 06/27/2019   CAD (coronary artery disease)    Chronic atrial fibrillation ([place]) 09/06/2019   Epistaxis 06/27/2019   Essential hypertension 04/12/2018   Hypertension 09/11/2019   Hypertensive retinopathy    OU   Hypothyroidism    Left inguinal hernia 08/10/2017   Macular degeneration    Wet OD; Dry OS   Permanent atrial fibrillation ([place]) 01/14/2019   Postoperative examination 11/29/2017   Preoperative cardiovascular examination 09/26/2017   Presence of [place]man left atrial appendage closure device 02/04/2020   PVC (premature ventricular contraction)    Sinus bradycardia     Vitamin D deficiency    Past Surgical History:  Procedure Laterality Date   CATARACT EXTRACTION Bilateral    [facility]   EYE SURGERY Bilateral    Cat Sx   HERNIA REPAIR     FAMILY HISTORY Family History  Problem Relation Age of Onset   Cancer Father    Heart attack Father    Colon cancer Father    Heart disease Brother    Diabetes Brother    Rectal cancer Neg Hx    Stomach cancer Neg Hx    SOCIAL HISTORY Social History   Tobacco Use   Smoking status: Former    Types: Cigarettes    Quit date: 1983    Years since quitting: 41.2   Smokeless tobacco: Never  Vaping Use   Vaping Use: Never used  Substance Use Topics   Alcohol use: Not Currently    Alcohol/week: 1.0 standard drink of alcohol    Types: 1 Cans of beer per week    Comment: "every once in a blue moon"   Drug use: No       OPHTHALMIC EXAM: Base Eye Exam     Visual Acuity (Snellen - Linear)       Right Left   Dist cc 20/100 -1 20/40 -2   Dist ph cc 20/100 +2 NI    Correction: Glasses         Tonometry (Tonopen, 8:37 AM)       Right Left   Pressure 10 09         Pupils       Dark Light Shape React APD   Right 4 4 Round NR None   Left 2 1 Round Minimal None         Visual Fields (Counting fingers)       Left Right    Full Full         Extraocular Movement       Right Left    Full, Ortho Full, Ortho         Neuro/Psych     Oriented x3: Yes   Mood/Affect: Normal         Dilation     Both eyes: 1.0% Mydriacyl, 2.5% Phenylephrine @ 8:37 AM           Slit Lamp and Fundus Exam     Slit Lamp Exam       Right Left   Lids/Lashes Dermatochalasis - upper lid, +hooding Dermatochalasis - upper lid, Meibomian gland dysfunction   Conjunctiva/Sclera White and quiet White and quiet   Cornea 3+ Punctate epithelial erosions, tear film debris, well healed cataract wound 3+Punctate epithelial erosions, irregular epi, tear film  debris, decreased TBUT   Anterior Chamber deep and  clear deep and clear   Iris Round and moderately dilated Round and poorly dilated to 4.51mm   Lens PC IOL in good position with open PC PC IOL in good position with open PC   Anterior Vitreous Vitreous syneresis Vitreous syneresis         Fundus Exam       Right Left   Disc mild Pallor, Sharp rim, peripapillary CNV with mild cystic changes ST disc -- stable improved, no heme mild Pallor, Sharp rim; peripapillary CNV with mild cystic changes overlying - stably improved, no heme   C/D Ratio 0.4 0.4   Macula Flat, Blunted foveal reflex, +peripapillary heme (resolved) and  exudate -- stably improved, drusen, central VMT with cystic changes - persistent Flat, Blunted foveal reflex, fine Drusen, mild Retinal pigment epithelial mottling, no heme, focal edema nasal mac -- improved, peripapillary CNV with overlying cystic changes - improved, central cystic changes (VMT)   Vessels attenuated, Tortuous attenuated, Tortuous   Periphery Attached, mild RPE changes, No heme Attached, no heme, mild RPE changes           Refraction     Wearing Rx       Sphere Cylinder Axis Add   Right -3.00 +3.00 005 +2.75   Left -1.50 +3.00 167 +2.75           IMAGING AND PROCEDURES  Imaging and Procedures for @TODAY @  OCT, Retina - OU - Both Eyes       Right Eye Quality was poor. Central Foveal Thickness: 302. Progression has been stable. Findings include no SRF, abnormal foveal contour, retinal drusen , subretinal hyper-reflective material, intraretinal hyper-reflective material, epiretinal membrane, intraretinal fluid, pigment epithelial detachment, vitreous traction, outer retinal atrophy (Persistent central VMT with cystic changes -- stable; peripapillary CNV with stable improvement in IRF/SRHM).   Left Eye Quality was borderline. Central Foveal Thickness: 284. Progression has been stable. Findings include no SRF, abnormal foveal contour, retinal  drusen , subretinal hyper-reflective material, intraretinal fluid, vitreous traction (+VMT with persistent cystic changes; central SRHM (vitelliform lesion); peripapillary SRHM with stablel improvement in cystic changes overlying).   Notes *Images captured and stored on drive  Diagnosis / Impression:  OD: exudative ARMD -- Persistent central VMT with cystic changes -- stable; peripapillary CNV with stable improvement in IRF/SRHM OS: exudative ARMD -- +VMT with persistent cystic changes; central SRHM (vitelliform lesion); peripapillary SRHM with stable improvement in cystic changes overlying  Clinical management: See below  Abbreviations: NFP - Normal foveal profile. CME - cystoid macular edema. PED - pigment epithelial detachment. IRF - intraretinal fluid. SRF - subretinal fluid. EZ - ellipsoid zone. ERM - epiretinal membrane. ORA - outer retinal atrophy. ORT - outer retinal tubulation. SRHM - subretinal hyper-reflective material      Intravitreal Injection, Pharmacologic Agent - OD - Right Eye       Time Out 05/19/2022. 9:14 AM. Confirmed correct patient, procedure, site, and patient consented.   Anesthesia Topical anesthesia was used. Anesthetic medications included Lidocaine 2%, Proparacaine 0.5%.   Procedure Preparation included 5% betadine to ocular surface, eyelid speculum. A (32g) needle was used.   Injection: 2 mg aflibercept 2 MG/0.05ML   Route: Intravitreal, Site: Right Eye   NDC: [id number], Lot: [id number], Expiration date: 06/21/2023, Waste: 0 mL   Post-op Post injection exam found visual acuity of at least counting fingers. The patient tolerated the procedure well. There were no complications. The patient received written and verbal post procedure care education. Post injection medications were not  given.             ASSESSMENT/PLAN:   ICD-10-CM   1. Exudative age-related macular degeneration of both eyes with active choroidal neovascularization (HCC)   H35.3231 OCT, Retina - OU - Both Eyes    Intravitreal Injection, Pharmacologic Agent - OD - Right Eye    aflibercept (EYLEA) SOLN 2 mg    2. Vitreomacular adhesion of both eyes  H43.823     3. Essential hypertension  I10     4. Hypertensive retinopathy of both eyes  H35.033     5. Pseudophakia of both eyes  Z96.1     6. Dry eyes  H04.123      1. Exudative age related macular degeneration, both eyes  - delayed to follow up from 7 weeks to 9 weeks (01.25.24 to 03.28.24) due to transportation  - pt delayed to follow up from 12 weeks to 6 months due to emergency cholecystomy right before his last appt (June 2023 to Dec 2023)  - OD incidental discovery of peripapillary CNV w/ heme and SRF during emergency visit for corneal abrasion OS  - OS Interval conversion of Exudative ARMD noted on 09.22.22 visit (peripapillary CNV w/ SRF)             - S/P IVA OD #1 (03.22.21), #2 (04.19.21), #3 (05.26.21), #4 (06.23.21) -- IVA resistance  - S/P IVE OD #1 sample (07.22.21), #2 (08.24.21), #3 (10.13.21), #4 (11.18.21), #5 (12.30.21), #6 (02.24.22), #7 (04.21.22), #8 (06.30.22), #9 (09.22.22), #10 (12.20.22), #11 (01.31.23), #12 (03.29.23), #13 (06.08.23), #14 (12.14.23), #15 (01.25.24) - s/p IVA OS #1 (11.22.22) - s/p IVE OS #1 (09.22.22), #2 (10.21.22), #3 (12.20.22), #4 (01.31.23), #5 (03.29.23), #6 (06.08.23), #7 (12.14.23)  - BCVA OD 20/100 - stable; OS 20/40 - improved  - OCT shows OD: exudative ARMD -- Persistent central VMT with cystic changes -- stable; peripapillary CNV with stable improvement in IRF/SRHM; OS: exudative ARMD -- +VMT with persistent cystic changes; central SRHM (vitelliform lesion); peripapillary SRHM with stable improvement in cystic changes overlying at 9 weeks - recommend IVE OD #16 today, 03.15.24 with follow up extended to 10 weeks  - will hold off on OS again today -- will treat prn - pt wishes to proceed with injections  - RBA of procedure discussed, questions answered  -  IVE informed consent obtained, and re-signed, 12.14.23 OU  - Avastin informed consent obtained, signed and scanned, 03.22.21  - see procedure note  - Eylea4U benefits investigation started 07.22.21 -- approved for 2023  - f/u in 10 weeks -- DFE, OCT, possible injxns -- tx and ext as able  2. VMT OU  - mild -- OCT stable OU  - review of OCTs since initial visit shows progressive traction -- hopefully will release uneventfully soon  - monitor   3,4. Hypertensive retinopathy OU  - discussed importance of tight BP control  - monitor   5. Pseudophakia OU  - s/p CE/IOL OU  - IOLs in good position, doing well  - monitor   6. Dry eyes OU - recommend artificial tears and lubricating ointment as needed   Ophthalmic Meds Ordered this visit:  Meds ordered this encounter  Medications   aflibercept (EYLEA) SOLN 2 mg     Return in about 10 weeks (around 07/28/2022) for f/u exu ARMD OU, DFE, OCT.  There are no Patient Instructions on file for this visit.  Explained the diagnoses, plan, and follow up with the patient and they expressed understanding.  Patient  expressed understanding of the importance of proper follow up care.   This document serves as a record of services personally performed by Gardiner Sleeper, MD, PhD. It was created on their behalf by San Jetty. Owens Shark, OA an ophthalmic technician. The creation of this record is the provider's dictation and/or activities during the visit.    Electronically signed by: San Jetty. Owens Shark, [place] 03.14.2024 12:08 PM  Gardiner Sleeper, M.D., Ph.D. Diseases & Surgery of the Retina and Vitreous [facility]  I have reviewed the above documentation for accuracy and completeness, and I agree with the above. Gardiner Sleeper, M.D., Ph.D. 05/19/22 12:09 PM   Abbreviations: M myopia (nearsighted); A astigmatism; H hyperopia (farsighted); P presbyopia; Mrx spectacle prescription;  CTL contact lenses; OD right eye; OS left eye; OU both  eyes  XT exotropia; ET esotropia; PEK punctate epithelial keratitis; PEE punctate epithelial erosions; DES dry eye syndrome; MGD meibomian gland dysfunction; ATs artificial tears; PFAT's preservative free artificial tears; [place] nuclear sclerotic cataract; PSC posterior subcapsular cataract; ERM epi-retinal membrane; PVD posterior vitreous detachment; RD retinal detachment; DM diabetes mellitus; DR diabetic retinopathy; NPDR non-proliferative diabetic retinopathy; PDR proliferative diabetic retinopathy; CSME clinically significant macular edema; DME diabetic macular edema; dbh dot blot hemorrhages; CWS cotton wool spot; POAG primary open angle glaucoma; C/D cup-to-disc ratio; HVF humphrey visual field; GVF goldmann visual field; OCT optical coherence tomography; IOP intraocular pressure; BRVO Branch retinal vein occlusion; CRVO central retinal vein occlusion; CRAO central retinal artery occlusion; BRAO branch retinal artery occlusion; RT retinal tear; SB scleral buckle; PPV pars plana vitrectomy; VH Vitreous hemorrhage; PRP panretinal laser photocoagulation; IVK intravitreal kenalog; VMT vitreomacular traction; MH Macular hole;  NVD neovascularization of the disc; NVE neovascularization elsewhere; AREDS age related eye disease study; ARMD age related macular degeneration; POAG primary open angle glaucoma; EBMD epithelial/anterior basement membrane dystrophy; ACIOL anterior chamber intraocular lens; IOL intraocular lens; PCIOL posterior chamber intraocular lens; Phaco/IOL phacoemulsification with intraocular lens placement; [place] photorefractive keratectomy; LASIK laser assisted in situ keratomileusis; HTN hypertension; DM diabetes mellitus; COPD chronic obstructive pulmonary disease

## 2022-05-19 ENCOUNTER — Ambulatory Visit (INDEPENDENT_AMBULATORY_CARE_PROVIDER_SITE_OTHER): Payer: Medicare Other | Admitting: Ophthalmology

## 2022-05-19 ENCOUNTER — Encounter (INDEPENDENT_AMBULATORY_CARE_PROVIDER_SITE_OTHER): Payer: Self-pay | Admitting: Ophthalmology

## 2022-05-19 DIAGNOSIS — H43823 Vitreomacular adhesion, bilateral: Secondary | ICD-10-CM | POA: Diagnosis not present

## 2022-05-19 DIAGNOSIS — H04123 Dry eye syndrome of bilateral lacrimal glands: Secondary | ICD-10-CM

## 2022-05-19 DIAGNOSIS — H353231 Exudative age-related macular degeneration, bilateral, with active choroidal neovascularization: Secondary | ICD-10-CM | POA: Diagnosis not present

## 2022-05-19 DIAGNOSIS — I10 Essential (primary) hypertension: Secondary | ICD-10-CM | POA: Diagnosis not present

## 2022-05-19 DIAGNOSIS — Z961 Presence of intraocular lens: Secondary | ICD-10-CM

## 2022-05-19 DIAGNOSIS — H35033 Hypertensive retinopathy, bilateral: Secondary | ICD-10-CM | POA: Diagnosis not present

## 2022-05-19 MED ORDER — AFLIBERCEPT 2MG/0.05ML IZ SOLN FOR KALEIDOSCOPE
2.0000 mg | INTRAVITREAL | Status: AC | PRN
  Administered 2022-05-19: 2 mg via INTRAVITREAL

## 2022-07-14 NOTE — Progress Notes (Signed)
[facility] - Clinic Note  07/28/2022     CHIEF COMPLAINT Patient presents for Retina Follow Up   HISTORY OF PRESENT ILLNESS: Eddie Kelly is a 89 y.o. male who presents to the clinic today for:   HPI     Retina Follow Up   Patient presents with  Wet AMD.  In both eyes.  This started 10 weeks ago.  Duration of 10 weeks.  Since onset it is stable.  I, the attending physician,  performed the HPI with the patient and updated documentation appropriately.        Comments   10 week retina ARMD ou and I'VE OD pt is reporting no vision changes noticed he denies any flashes or floaters       Last edited by [name], MD on 07/28/2022  1:08 PM.    Pt is on oxygen now, he states it fluctuates and he feels the same whether it's in the 80's or 95, he states he fell and a chair hit him in the chest, he did not crack any ribs    Referring physician: [name], MD [address] [place],  [place] [address]  HISTORICAL INFORMATION:   Selected notes from the MEDICAL RECORD NUMBER Referred by Dr. Zetta Bills for concern of CNVM OD LEE: 05/04/2019 BCVA: 20/70-1            20/50+1   CURRENT MEDICATIONS: No current outpatient medications on file. (Ophthalmic Drugs)   No current facility-administered medications for this visit. (Ophthalmic Drugs)   Current Outpatient Medications (Other)  Medication Sig   aspirin EC 81 MG tablet Take 81 mg by mouth daily.   bethanechol (URECHOLINE) 25 MG tablet Take 25 mg by mouth 2 (two) times daily.   cyanocobalamin (,VITAMIN B-12,) 1000 MCG/ML injection Inject 1,000 mcg into the muscle every 30 (thirty) days.   diclofenac Sodium (VOLTAREN) 1 % GEL Apply 1 application topically 3 (three) times daily as needed for pain.   ferrous sulfate 325 (65 FE) MG EC tablet Take 325 mg by mouth 3 (three) times daily with meals.   finasteride (PROSCAR) 5 MG tablet Take 5 mg by mouth daily.   furosemide (LASIX) 20 MG tablet Take 20 mg  by mouth daily.   levothyroxine (SYNTHROID) 100 MCG tablet Take 100 mcg by mouth daily.   loratadine (CLARITIN) 10 MG tablet Take 10 mg by mouth daily.   memantine (NAMENDA) 10 MG tablet Take 10 mg by mouth daily.   metoprolol tartrate (LOPRESSOR) 25 MG tablet Take 25 mg by mouth 2 (two) times daily.   montelukast (SINGULAIR) 10 MG tablet Take 10 mg by mouth at bedtime.   Multiple Vitamin (MULTIVITAMIN WITH MINERALS) TABS tablet Take 1 tablet by mouth daily.   nitroGLYCERIN (NITROSTAT) 0.4 MG SL tablet Place 0.4 mg under the tongue every 5 (five) minutes as needed for chest pain.   Probiotic Product (PROBIOTIC DAILY PO) Take 2 capsules by mouth daily.   saw palmetto 80 MG capsule Take 80 mg by mouth daily.   tamsulosin (FLOMAX) 0.4 MG CAPS capsule Take 1 capsule by mouth daily.   No current facility-administered medications for this visit. (Other)   REVIEW OF SYSTEMS: ROS   Positive for: Neurological, Cardiovascular, Eyes Negative for: Constitutional, Gastrointestinal, Skin, Genitourinary, Musculoskeletal, HENT, Endocrine, Respiratory, Psychiatric, Allergic/Imm, Heme/Lymph Last edited by Etheleen Mayhew, COT on 07/28/2022  8:35 AM.       ALLERGIES Allergies  Allergen Reactions  Ciprofloxacin Other (See Comments)    Edema, generalized   Levofloxacin    PAST MEDICAL HISTORY Past Medical History:  Diagnosis Date   A-fib [facility])    Anxiety and depression    Aspiration pneumonia (HCC) 06/27/2019   CAD (coronary artery disease)    Chronic atrial fibrillation (HCC) 09/06/2019   Epistaxis 06/27/2019   Essential hypertension 04/12/2018   Hypertension 09/11/2019   Hypertensive retinopathy    OU   Hypothyroidism    Left inguinal hernia 08/10/2017   Macular degeneration    Wet OD; Dry OS   Permanent atrial fibrillation (HCC) 01/14/2019   Postoperative examination 11/29/2017   Preoperative cardiovascular examination 09/26/2017   Presence of Watchman left atrial appendage closure device  02/04/2020   PVC (premature ventricular contraction)    Sinus bradycardia    Vitamin D deficiency    Past Surgical History:  Procedure Laterality Date   CATARACT EXTRACTION Bilateral    CHOLECYSTECTOMY     [facility] [place]   EYE SURGERY Bilateral    Cat Sx   HERNIA REPAIR     FAMILY HISTORY Family History  Problem Relation Age of Onset   Cancer Father    Heart attack Father    Colon cancer Father    Heart disease Brother    Diabetes Brother    Rectal cancer Neg Hx    Stomach cancer Neg Hx    SOCIAL HISTORY Social History   Tobacco Use   Smoking status: Former    Types: Cigarettes    Quit date: 1983    Years since quitting: 41.4   Smokeless tobacco: Never  Vaping Use   Vaping Use: Never used  Substance Use Topics   Alcohol use: Not Currently    Alcohol/week: 1.0 standard drink of alcohol    Types: 1 Cans of beer per week    Comment: "every once in a blue moon"   Drug use: No       OPHTHALMIC EXAM: Base Eye Exam     Visual Acuity (Snellen - Linear)       Right Left   Dist cc 20/100 -3 20/40 -2   Dist ph cc NI NI    Correction: Glasses         Tonometry (Tonopen, 8:38 AM)       Right Left   Pressure 12 12         Pupils       Pupils Dark Light Shape React APD   Right PERRL 4 4 Round NR None   Left PERRL 2 1 Round Sluggish None         Visual Fields       Left Right    Full Full         Extraocular Movement       Right Left    Full, Ortho Full, Ortho         Neuro/Psych     Oriented x3: Yes   Mood/Affect: Normal         Dilation     Both eyes: 2.5% Phenylephrine @ 8:38 AM           Slit Lamp and Fundus Exam     Slit Lamp Exam       Right Left   Lids/Lashes Dermatochalasis - upper lid, +hooding Dermatochalasis - upper lid, Meibomian gland dysfunction   Conjunctiva/Sclera White and quiet White and quiet   Cornea 3+ Punctate epithelial erosions, tear film debris, well healed cataract wound  3+Punctate epithelial erosions, irregular epi, tear film debris, decreased TBUT   Anterior Chamber deep and clear deep and clear   Iris Round and moderately dilated Round and poorly dilated to 4.69mm   Lens PC IOL in good position with open PC PC IOL in good position with open PC   Anterior Vitreous Vitreous syneresis Vitreous syneresis         Fundus Exam       Right Left   Disc mild Pallor, Sharp rim, peripapillary CNV with mild cystic changes ST disc -- stable improved, no heme mild Pallor, Sharp rim; peripapillary CNV with mild cystic changes overlying - stably improved, no heme   C/D Ratio 0.4 0.4   Macula Flat, Blunted foveal reflex, drusen, central VMT with cystic changes - persistent Flat, Blunted foveal reflex, fine Drusen, mild Retinal pigment epithelial mottling, no heme, focal edema nasal mac -- stably improved, central cystic changes (VMT)   Vessels attenuated, mild tortuosity attenuated, Tortuous   Periphery Attached, mild RPE changes, No heme Attached, no heme, mild RPE changes           Refraction     Wearing Rx       Sphere Cylinder Axis Add   Right -3.00 +3.00 005 +2.75   Left -1.50 +3.00 167 +2.75           IMAGING AND PROCEDURES  Imaging and Procedures for @TODAY @  OCT, Retina - OU - Both Eyes       Right Eye Quality was good. Central Foveal Thickness: 399. Progression has been stable. Findings include no SRF, abnormal foveal contour, retinal drusen , subretinal hyper-reflective material, intraretinal hyper-reflective material, epiretinal membrane, intraretinal fluid, pigment epithelial detachment, vitreous traction, outer retinal atrophy (Persistent central VMT with cystic changes -- stable; peripapillary CNV with stable improvement in IRF/SRHM).   Left Eye Quality was borderline. Central Foveal Thickness: 284. Progression has been stable. Findings include no SRF, abnormal foveal contour, retinal drusen , subretinal hyper-reflective material,  intraretinal fluid, vitreous traction (+VMT with persistent cystic changes; central SRHM (vitelliform lesion); peripapillary SRHM with stable improvement in cystic changes overlying).   Notes *Images captured and stored on drive  Diagnosis / Impression:  OD: exudative ARMD -- Persistent central VMT with cystic changes -- stable; peripapillary CNV with stable improvement in IRF/SRHM OS: exudative ARMD -- +VMT with persistent cystic changes; central SRHM (vitelliform lesion); peripapillary SRHM with stable improvement in cystic changes overlying  Clinical management: See below  Abbreviations: NFP - Normal foveal profile. CME - cystoid macular edema. PED - pigment epithelial detachment. IRF - intraretinal fluid. SRF - subretinal fluid. EZ - ellipsoid zone. ERM - epiretinal membrane. ORA - outer retinal atrophy. ORT - outer retinal tubulation. SRHM - subretinal hyper-reflective material      Intravitreal Injection, Pharmacologic Agent - OD - Right Eye       Time Out 07/28/2022. 9:17 AM. Confirmed correct patient, procedure, site, and patient consented.   Anesthesia Topical anesthesia was used. Anesthetic medications included Lidocaine 2%, Proparacaine 0.5%.   Procedure Preparation included 5% betadine to ocular surface, eyelid speculum. A (32g) needle was used.   Injection: 2 mg aflibercept 2 MG/0.05ML   Route: Intravitreal, Site: Right Eye   NDC: L6038910, Lot: [id number], Expiration date: 06/21/2023, Waste: 0 mL   Post-op Post injection exam found visual acuity of at least counting fingers. The patient tolerated the procedure well. There were no complications. The patient received written and verbal post procedure care education. Post injection medications were  not given.            ASSESSMENT/PLAN:   ICD-10-CM   1. Exudative age-related macular degeneration of both eyes with active choroidal neovascularization (HCC)  H35.3231 OCT, Retina - OU - Both Eyes    Intravitreal  Injection, Pharmacologic Agent - OD - Right Eye    aflibercept (EYLEA) SOLN 2 mg    2. Vitreomacular adhesion of both eyes  H43.823     3. Essential hypertension  I10     4. Hypertensive retinopathy of both eyes  H35.033     5. Pseudophakia of both eyes  Z96.1     6. Dry eyes  H04.123      1. Exudative age related macular degeneration, both eyes  - delayed to follow up from 7 weeks to 9 weeks (01.25.24 to 03.28.24) due to transportation  - pt delayed to follow up from 12 weeks to 6 months due to emergency cholecystomy right before his last appt (June 2023 to Dec 2023)  - OD incidental discovery of peripapillary CNV w/ heme and SRF during emergency visit for corneal abrasion OS  - OS Interval conversion of Exudative ARMD noted on 09.22.22 visit (peripapillary CNV w/ SRF)             - S/P IVA OD #1 (03.22.21), #2 (04.19.21), #3 (05.26.21), #4 (06.23.21) -- IVA resistance  - S/P IVE OD #1 sample (07.22.21), #2 (08.24.21), #3 (10.13.21), #4 (11.18.21), #5 (12.30.21), #6 (02.24.22), #7 (04.21.22), #8 (06.30.22), #9 (09.22.22), #10 (12.20.22), #11 (01.31.23), #12 (03.29.23), #13 (06.08.23), #14 (12.14.23), #15 (01.25.24), #16 (03.15.24) - s/p IVA OS #1 (11.22.22) - s/p IVE OS #1 (09.22.22), #2 (10.21.22), #3 (12.20.22), #4 (01.31.23), #5 (03.29.23), #6 (06.08.23), #7 (12.14.23)  - BCVA OD 20/100 - stable; OS 20/40 - improved  - OCT shows OD: exudative ARMD -- Persistent central VMT with cystic changes -- stable; peripapillary CNV with stable improvement in IRF/SRHM; OS: exudative ARMD -- +VMT with persistent cystic changes; central SRHM (vitelliform lesion); peripapillary SRHM with stable improvement in cystic changes overlying at 10 weeks - recommend IVE OD #17 today, 06.06.24 with follow up extended to 12 weeks  - will hold off on OS again today -- will treat prn - pt wishes to proceed with injections  - RBA of procedure discussed, questions answered  - IVE informed consent obtained, and  re-signed, 12.14.23 OU  - Avastin informed consent obtained, signed and scanned, 03.22.21  - see procedure note  - Eylea4U benefits investigation started 07.22.21 -- approved for 2023  - f/u in 12 weeks -- DFE, OCT, possible injxns -- tx and ext as able  2. VMT OU  - mild -- OCT stable OU  - review of OCTs since initial visit shows progressive traction -- hopefully will release uneventfully soon  - monitor   3,4. Hypertensive retinopathy OU  - discussed importance of tight BP control  - monitor   5. Pseudophakia OU  - s/p CE/IOL OU  - IOLs in good position, doing well  - monitor   6. Dry eyes OU - recommend artificial tears and lubricating ointment as needed   Ophthalmic Meds Ordered this visit:  Meds ordered this encounter  Medications   aflibercept (EYLEA) SOLN 2 mg     Return in about 12 weeks (around 10/20/2022) for f/u exu ARMD OU, DFE, OCT.  There are no Patient Instructions on file for this visit.  Explained the diagnoses, plan, and follow up with the patient and they expressed understanding.  Patient expressed understanding of the importance of proper follow up care.   This document serves as a record of services personally performed by Karie Chimera, MD, PhD. It was created on their behalf by Glee Arvin. Manson Passey, OA an ophthalmic technician. The creation of this record is the provider's dictation and/or activities during the visit.    Electronically signed by: Glee Arvin. Manson Passey, [place] 05.23.2024 8:09 PM   Karie Chimera, M.D., Ph.D. Diseases & Surgery of the Retina and Vitreous [facility]  I have reviewed the above documentation for accuracy and completeness, and I agree with the above. Karie Chimera, M.D., Ph.D. 07/28/22 8:10 PM    Abbreviations: M myopia (nearsighted); A astigmatism; H hyperopia (farsighted); P presbyopia; Mrx spectacle prescription;  CTL contact lenses; OD right eye; OS left eye; OU both eyes  XT exotropia; ET esotropia;  PEK punctate epithelial keratitis; PEE punctate epithelial erosions; DES dry eye syndrome; MGD meibomian gland dysfunction; ATs artificial tears; PFAT's preservative free artificial tears; NSC nuclear sclerotic cataract; PSC posterior subcapsular cataract; ERM epi-retinal membrane; PVD posterior vitreous detachment; RD retinal detachment; DM diabetes mellitus; DR diabetic retinopathy; NPDR non-proliferative diabetic retinopathy; PDR proliferative diabetic retinopathy; CSME clinically significant macular edema; DME diabetic macular edema; dbh dot blot hemorrhages; CWS cotton wool spot; POAG primary open angle glaucoma; C/D cup-to-disc ratio; HVF humphrey visual field; GVF goldmann visual field; OCT optical coherence tomography; IOP intraocular pressure; BRVO Branch retinal vein occlusion; CRVO central retinal vein occlusion; CRAO central retinal artery occlusion; BRAO branch retinal artery occlusion; RT retinal tear; SB scleral buckle; PPV pars plana vitrectomy; VH Vitreous hemorrhage; PRP panretinal laser photocoagulation; IVK intravitreal kenalog; VMT vitreomacular traction; MH Macular hole;  NVD neovascularization of the disc; NVE neovascularization elsewhere; AREDS age related eye disease study; ARMD age related macular degeneration; POAG primary open angle glaucoma; EBMD epithelial/anterior basement membrane dystrophy; ACIOL anterior chamber intraocular lens; IOL intraocular lens; PCIOL posterior chamber intraocular lens; Phaco/IOL phacoemulsification with intraocular lens placement; PRK photorefractive keratectomy; LASIK laser assisted in situ keratomileusis; HTN hypertension; DM diabetes mellitus; COPD chronic obstructive pulmonary disease

## 2022-07-28 ENCOUNTER — Ambulatory Visit (INDEPENDENT_AMBULATORY_CARE_PROVIDER_SITE_OTHER): Payer: Medicare Other | Admitting: Ophthalmology

## 2022-07-28 ENCOUNTER — Encounter (INDEPENDENT_AMBULATORY_CARE_PROVIDER_SITE_OTHER): Payer: Self-pay | Admitting: Ophthalmology

## 2022-07-28 DIAGNOSIS — H35033 Hypertensive retinopathy, bilateral: Secondary | ICD-10-CM | POA: Diagnosis not present

## 2022-07-28 DIAGNOSIS — I10 Essential (primary) hypertension: Secondary | ICD-10-CM | POA: Diagnosis not present

## 2022-07-28 DIAGNOSIS — H43823 Vitreomacular adhesion, bilateral: Secondary | ICD-10-CM

## 2022-07-28 DIAGNOSIS — H04123 Dry eye syndrome of bilateral lacrimal glands: Secondary | ICD-10-CM

## 2022-07-28 DIAGNOSIS — H353231 Exudative age-related macular degeneration, bilateral, with active choroidal neovascularization: Secondary | ICD-10-CM | POA: Diagnosis not present

## 2022-07-28 DIAGNOSIS — Z961 Presence of intraocular lens: Secondary | ICD-10-CM | POA: Diagnosis not present

## 2022-07-28 MED ORDER — AFLIBERCEPT 2MG/0.05ML IZ SOLN FOR KALEIDOSCOPE
2.0000 mg | INTRAVITREAL | Status: AC | PRN
  Administered 2022-07-28: 2 mg via INTRAVITREAL

## 2022-08-04 DIAGNOSIS — R339 Retention of urine, unspecified: Secondary | ICD-10-CM | POA: Diagnosis not present

## 2022-08-09 DIAGNOSIS — J449 Chronic obstructive pulmonary disease, unspecified: Secondary | ICD-10-CM | POA: Diagnosis not present

## 2022-08-15 DIAGNOSIS — I10 Essential (primary) hypertension: Secondary | ICD-10-CM | POA: Diagnosis not present

## 2022-08-15 DIAGNOSIS — J309 Allergic rhinitis, unspecified: Secondary | ICD-10-CM | POA: Diagnosis not present

## 2022-08-15 DIAGNOSIS — Z6824 Body mass index (BMI) 24.0-24.9, adult: Secondary | ICD-10-CM | POA: Diagnosis not present

## 2022-08-15 DIAGNOSIS — M179 Osteoarthritis of knee, unspecified: Secondary | ICD-10-CM | POA: Diagnosis not present

## 2022-08-15 DIAGNOSIS — G629 Polyneuropathy, unspecified: Secondary | ICD-10-CM | POA: Diagnosis not present

## 2022-08-15 DIAGNOSIS — N289 Disorder of kidney and ureter, unspecified: Secondary | ICD-10-CM | POA: Diagnosis not present

## 2022-08-15 DIAGNOSIS — E785 Hyperlipidemia, unspecified: Secondary | ICD-10-CM | POA: Diagnosis not present

## 2022-08-15 DIAGNOSIS — Z8679 Personal history of other diseases of the circulatory system: Secondary | ICD-10-CM | POA: Diagnosis not present

## 2022-08-18 DIAGNOSIS — E785 Hyperlipidemia, unspecified: Secondary | ICD-10-CM | POA: Diagnosis not present

## 2022-08-18 DIAGNOSIS — Z79899 Other long term (current) drug therapy: Secondary | ICD-10-CM | POA: Diagnosis not present

## 2022-08-18 DIAGNOSIS — E559 Vitamin D deficiency, unspecified: Secondary | ICD-10-CM | POA: Diagnosis not present

## 2022-09-01 DIAGNOSIS — R918 Other nonspecific abnormal finding of lung field: Secondary | ICD-10-CM | POA: Diagnosis not present

## 2022-09-01 DIAGNOSIS — D649 Anemia, unspecified: Secondary | ICD-10-CM | POA: Diagnosis not present

## 2022-09-01 DIAGNOSIS — R109 Unspecified abdominal pain: Secondary | ICD-10-CM | POA: Diagnosis not present

## 2022-09-01 DIAGNOSIS — Z9181 History of falling: Secondary | ICD-10-CM | POA: Diagnosis not present

## 2022-09-01 DIAGNOSIS — N281 Cyst of kidney, acquired: Secondary | ICD-10-CM | POA: Diagnosis not present

## 2022-09-01 DIAGNOSIS — R310 Gross hematuria: Secondary | ICD-10-CM | POA: Diagnosis not present

## 2022-09-01 DIAGNOSIS — R9431 Abnormal electrocardiogram [ECG] [EKG]: Secondary | ICD-10-CM | POA: Diagnosis not present

## 2022-09-01 DIAGNOSIS — J90 Pleural effusion, not elsewhere classified: Secondary | ICD-10-CM | POA: Diagnosis not present

## 2022-09-01 DIAGNOSIS — R339 Retention of urine, unspecified: Secondary | ICD-10-CM | POA: Diagnosis not present

## 2022-09-01 DIAGNOSIS — Z95818 Presence of other cardiac implants and grafts: Secondary | ICD-10-CM | POA: Diagnosis not present

## 2022-09-01 DIAGNOSIS — I2510 Atherosclerotic heart disease of native coronary artery without angina pectoris: Secondary | ICD-10-CM | POA: Diagnosis not present

## 2022-09-01 DIAGNOSIS — J449 Chronic obstructive pulmonary disease, unspecified: Secondary | ICD-10-CM | POA: Diagnosis not present

## 2022-09-01 DIAGNOSIS — R188 Other ascites: Secondary | ICD-10-CM | POA: Diagnosis not present

## 2022-09-01 DIAGNOSIS — Z79899 Other long term (current) drug therapy: Secondary | ICD-10-CM | POA: Diagnosis not present

## 2022-09-01 DIAGNOSIS — R531 Weakness: Secondary | ICD-10-CM | POA: Diagnosis not present

## 2022-09-01 DIAGNOSIS — I110 Hypertensive heart disease with heart failure: Secondary | ICD-10-CM | POA: Diagnosis not present

## 2022-09-01 DIAGNOSIS — I482 Chronic atrial fibrillation, unspecified: Secondary | ICD-10-CM | POA: Diagnosis not present

## 2022-09-01 DIAGNOSIS — N390 Urinary tract infection, site not specified: Secondary | ICD-10-CM | POA: Diagnosis not present

## 2022-09-02 DIAGNOSIS — N390 Urinary tract infection, site not specified: Secondary | ICD-10-CM | POA: Diagnosis not present

## 2022-09-02 DIAGNOSIS — J90 Pleural effusion, not elsewhere classified: Secondary | ICD-10-CM | POA: Diagnosis not present

## 2022-09-02 DIAGNOSIS — N289 Disorder of kidney and ureter, unspecified: Secondary | ICD-10-CM | POA: Diagnosis not present

## 2022-09-02 DIAGNOSIS — Z48813 Encounter for surgical aftercare following surgery on the respiratory system: Secondary | ICD-10-CM | POA: Diagnosis not present

## 2022-09-02 DIAGNOSIS — R109 Unspecified abdominal pain: Secondary | ICD-10-CM | POA: Diagnosis not present

## 2022-09-02 DIAGNOSIS — R918 Other nonspecific abnormal finding of lung field: Secondary | ICD-10-CM | POA: Diagnosis not present

## 2022-09-03 DIAGNOSIS — J90 Pleural effusion, not elsewhere classified: Secondary | ICD-10-CM | POA: Diagnosis not present

## 2022-09-03 DIAGNOSIS — N390 Urinary tract infection, site not specified: Secondary | ICD-10-CM | POA: Diagnosis not present

## 2022-09-03 DIAGNOSIS — R109 Unspecified abdominal pain: Secondary | ICD-10-CM | POA: Diagnosis not present

## 2022-09-03 DIAGNOSIS — N289 Disorder of kidney and ureter, unspecified: Secondary | ICD-10-CM | POA: Diagnosis not present

## 2022-09-06 DIAGNOSIS — Z09 Encounter for follow-up examination after completed treatment for conditions other than malignant neoplasm: Secondary | ICD-10-CM | POA: Diagnosis not present

## 2022-09-06 DIAGNOSIS — Z79899 Other long term (current) drug therapy: Secondary | ICD-10-CM | POA: Diagnosis not present

## 2022-09-06 DIAGNOSIS — Y828 Other medical devices associated with adverse incidents: Secondary | ICD-10-CM | POA: Diagnosis not present

## 2022-09-06 DIAGNOSIS — R2243 Localized swelling, mass and lump, lower limb, bilateral: Secondary | ICD-10-CM | POA: Diagnosis not present

## 2022-09-06 DIAGNOSIS — Z8679 Personal history of other diseases of the circulatory system: Secondary | ICD-10-CM | POA: Diagnosis not present

## 2022-09-06 DIAGNOSIS — I10 Essential (primary) hypertension: Secondary | ICD-10-CM | POA: Diagnosis not present

## 2022-09-06 DIAGNOSIS — T83011A Breakdown (mechanical) of indwelling urethral catheter, initial encounter: Secondary | ICD-10-CM | POA: Diagnosis not present

## 2022-09-06 DIAGNOSIS — T83091A Other mechanical complication of indwelling urethral catheter, initial encounter: Secondary | ICD-10-CM | POA: Diagnosis not present

## 2022-09-07 DIAGNOSIS — Y828 Other medical devices associated with adverse incidents: Secondary | ICD-10-CM | POA: Diagnosis not present

## 2022-09-07 DIAGNOSIS — T83011A Breakdown (mechanical) of indwelling urethral catheter, initial encounter: Secondary | ICD-10-CM | POA: Diagnosis not present

## 2022-09-08 DIAGNOSIS — J449 Chronic obstructive pulmonary disease, unspecified: Secondary | ICD-10-CM | POA: Diagnosis not present

## 2022-09-15 DIAGNOSIS — Z87440 Personal history of urinary (tract) infections: Secondary | ICD-10-CM | POA: Diagnosis not present

## 2022-09-15 DIAGNOSIS — J90 Pleural effusion, not elsewhere classified: Secondary | ICD-10-CM | POA: Diagnosis not present

## 2022-09-15 DIAGNOSIS — R339 Retention of urine, unspecified: Secondary | ICD-10-CM | POA: Diagnosis not present

## 2022-09-15 DIAGNOSIS — J9811 Atelectasis: Secondary | ICD-10-CM | POA: Diagnosis not present

## 2022-09-21 DIAGNOSIS — I10 Essential (primary) hypertension: Secondary | ICD-10-CM | POA: Diagnosis not present

## 2022-09-21 DIAGNOSIS — M170 Bilateral primary osteoarthritis of knee: Secondary | ICD-10-CM | POA: Diagnosis not present

## 2022-09-21 DIAGNOSIS — K5909 Other constipation: Secondary | ICD-10-CM | POA: Diagnosis not present

## 2022-09-21 DIAGNOSIS — R5383 Other fatigue: Secondary | ICD-10-CM | POA: Diagnosis not present

## 2022-09-21 DIAGNOSIS — J90 Pleural effusion, not elsewhere classified: Secondary | ICD-10-CM | POA: Diagnosis not present

## 2022-09-26 DIAGNOSIS — D500 Iron deficiency anemia secondary to blood loss (chronic): Secondary | ICD-10-CM | POA: Diagnosis not present

## 2022-09-26 DIAGNOSIS — Z87440 Personal history of urinary (tract) infections: Secondary | ICD-10-CM | POA: Diagnosis not present

## 2022-09-26 DIAGNOSIS — I4891 Unspecified atrial fibrillation: Secondary | ICD-10-CM | POA: Diagnosis not present

## 2022-09-26 DIAGNOSIS — R1310 Dysphagia, unspecified: Secondary | ICD-10-CM | POA: Diagnosis not present

## 2022-09-26 DIAGNOSIS — E559 Vitamin D deficiency, unspecified: Secondary | ICD-10-CM | POA: Diagnosis not present

## 2022-09-26 DIAGNOSIS — J449 Chronic obstructive pulmonary disease, unspecified: Secondary | ICD-10-CM | POA: Diagnosis not present

## 2022-09-26 DIAGNOSIS — G8929 Other chronic pain: Secondary | ICD-10-CM | POA: Diagnosis not present

## 2022-09-26 DIAGNOSIS — K649 Unspecified hemorrhoids: Secondary | ICD-10-CM | POA: Diagnosis not present

## 2022-09-26 DIAGNOSIS — I071 Rheumatic tricuspid insufficiency: Secondary | ICD-10-CM | POA: Diagnosis not present

## 2022-09-26 DIAGNOSIS — G629 Polyneuropathy, unspecified: Secondary | ICD-10-CM | POA: Diagnosis not present

## 2022-09-26 DIAGNOSIS — R600 Localized edema: Secondary | ICD-10-CM | POA: Diagnosis not present

## 2022-09-26 DIAGNOSIS — M545 Low back pain, unspecified: Secondary | ICD-10-CM | POA: Diagnosis not present

## 2022-09-26 DIAGNOSIS — I10 Essential (primary) hypertension: Secondary | ICD-10-CM | POA: Diagnosis not present

## 2022-09-26 DIAGNOSIS — E039 Hypothyroidism, unspecified: Secondary | ICD-10-CM | POA: Diagnosis not present

## 2022-09-26 DIAGNOSIS — J90 Pleural effusion, not elsewhere classified: Secondary | ICD-10-CM | POA: Diagnosis not present

## 2022-09-26 DIAGNOSIS — D510 Vitamin B12 deficiency anemia due to intrinsic factor deficiency: Secondary | ICD-10-CM | POA: Diagnosis not present

## 2022-09-26 DIAGNOSIS — N289 Disorder of kidney and ureter, unspecified: Secondary | ICD-10-CM | POA: Diagnosis not present

## 2022-09-26 DIAGNOSIS — E785 Hyperlipidemia, unspecified: Secondary | ICD-10-CM | POA: Diagnosis not present

## 2022-09-26 DIAGNOSIS — I2510 Atherosclerotic heart disease of native coronary artery without angina pectoris: Secondary | ICD-10-CM | POA: Diagnosis not present

## 2022-09-26 DIAGNOSIS — R634 Abnormal weight loss: Secondary | ICD-10-CM | POA: Diagnosis not present

## 2022-09-26 DIAGNOSIS — K589 Irritable bowel syndrome without diarrhea: Secondary | ICD-10-CM | POA: Diagnosis not present

## 2022-09-26 DIAGNOSIS — M170 Bilateral primary osteoarthritis of knee: Secondary | ICD-10-CM | POA: Diagnosis not present

## 2022-10-03 NOTE — Progress Notes (Signed)
[facility] - Clinic Note  10/13/2022     CHIEF COMPLAINT Patient presents for Retina Follow Up   HISTORY OF PRESENT ILLNESS: Eddie Kelly is a 89 y.o. male who presents to the clinic today for:   HPI     Retina Follow Up   Patient presents with  Wet AMD.  In both eyes.  This started months ago.  Duration of 11 weeks.  Since onset it is gradually worsening.  I, the attending physician,  performed the HPI with the patient and updated documentation appropriately.        Comments   Patient feels like the vision is getting worse. He is having trouble reading small print. He is not using eye drops.       Last edited by [name], MD on 10/13/2022 10:33 AM.    Pt states vision is stable, he is not using any drops, but his eyes do not feel dry to him   Referring physician: [name], MD [address] [place],  [place] [address]  HISTORICAL INFORMATION:   Selected notes from the MEDICAL RECORD NUMBER Referred by Dr. Zetta Bills for concern of CNVM OD LEE: 05/04/2019 BCVA: 20/70-1            20/50+1   CURRENT MEDICATIONS: No current outpatient medications on file. (Ophthalmic Drugs)   No current facility-administered medications for this visit. (Ophthalmic Drugs)   Current Outpatient Medications (Other)  Medication Sig   aspirin EC 81 MG tablet Take 81 mg by mouth daily.   bethanechol (URECHOLINE) 25 MG tablet Take 25 mg by mouth 2 (two) times daily.   cyanocobalamin (,VITAMIN B-12,) 1000 MCG/ML injection Inject 1,000 mcg into the muscle every 30 (thirty) days.   diclofenac Sodium (VOLTAREN) 1 % GEL Apply 1 application topically 3 (three) times daily as needed for pain.   ferrous sulfate 325 (65 FE) MG EC tablet Take 325 mg by mouth 3 (three) times daily with meals.   finasteride (PROSCAR) 5 MG tablet Take 5 mg by mouth daily.   furosemide (LASIX) 20 MG tablet Take 20 mg by mouth daily.   levothyroxine (SYNTHROID) 100 MCG tablet Take  100 mcg by mouth daily.   loratadine (CLARITIN) 10 MG tablet Take 10 mg by mouth daily.   memantine (NAMENDA) 10 MG tablet Take 10 mg by mouth daily.   metoprolol tartrate (LOPRESSOR) 25 MG tablet Take 25 mg by mouth 2 (two) times daily.   montelukast (SINGULAIR) 10 MG tablet Take 10 mg by mouth at bedtime.   Multiple Vitamin (MULTIVITAMIN WITH MINERALS) TABS tablet Take 1 tablet by mouth daily.   nitroGLYCERIN (NITROSTAT) 0.4 MG SL tablet Place 0.4 mg under the tongue every 5 (five) minutes as needed for chest pain.   Probiotic Product (PROBIOTIC DAILY PO) Take 2 capsules by mouth daily.   saw palmetto 80 MG capsule Take 80 mg by mouth daily.   tamsulosin (FLOMAX) 0.4 MG CAPS capsule Take 1 capsule by mouth daily.   No current facility-administered medications for this visit. (Other)   REVIEW OF SYSTEMS: ROS   Positive for: Neurological, Cardiovascular, Eyes Negative for: Constitutional, Gastrointestinal, Skin, Genitourinary, Musculoskeletal, HENT, Endocrine, Respiratory, Psychiatric, Allergic/Imm, Heme/Lymph Last edited by Charlette Caffey, COT on 10/13/2022  8:14 AM.     ALLERGIES Allergies  Allergen Reactions   Ciprofloxacin Other (See Comments)    Edema, generalized   Levofloxacin    PAST MEDICAL HISTORY Past Medical History:  Diagnosis Date   A-fib [facility])    Anxiety and depression    Aspiration pneumonia (HCC) 06/27/2019   CAD (coronary artery disease)    Chronic atrial fibrillation (HCC) 09/06/2019   Epistaxis 06/27/2019   Essential hypertension 04/12/2018   Hypertension 09/11/2019   Hypertensive retinopathy    OU   Hypothyroidism    Left inguinal hernia 08/10/2017   Macular degeneration    Wet OD; Dry OS   Permanent atrial fibrillation (HCC) 01/14/2019   Postoperative examination 11/29/2017   Preoperative cardiovascular examination 09/26/2017   Presence of Watchman left atrial appendage closure device 02/04/2020   PVC (premature ventricular contraction)    Sinus  bradycardia    Vitamin D deficiency    Past Surgical History:  Procedure Laterality Date   CATARACT EXTRACTION Bilateral    CHOLECYSTECTOMY     [facility] [place]   EYE SURGERY Bilateral    Cat Sx   HERNIA REPAIR     FAMILY HISTORY Family History  Problem Relation Age of Onset   Cancer Father    Heart attack Father    Colon cancer Father    Heart disease Brother    Diabetes Brother    Rectal cancer Neg Hx    Stomach cancer Neg Hx    SOCIAL HISTORY Social History   Tobacco Use   Smoking status: Former    Current packs/day: 0.00    Types: Cigarettes    Quit date: 1983    Years since quitting: 41.6   Smokeless tobacco: Never  Vaping Use   Vaping status: Never Used  Substance Use Topics   Alcohol use: Not Currently    Alcohol/week: 1.0 standard drink of alcohol    Types: 1 Cans of beer per week    Comment: "every once in a blue moon"   Drug use: No       OPHTHALMIC EXAM: Base Eye Exam     Visual Acuity (Snellen - Linear)       Right Left   Dist [place] 20/100 +1 20/40 +1   Dist ph [place] NI NI         Tonometry (Tonopen, 8:20 AM)       Right Left   Pressure 11 13         Pupils       Dark Light Shape React APD   Right 4 4 Round NR None   Left 2 2 Round Minimal None         Visual Fields       Left Right    Full Full         Extraocular Movement       Right Left    Full, Ortho Full, Ortho         Neuro/Psych     Oriented x3: Yes   Mood/Affect: Normal         Dilation     Both eyes: 2.5% Phenylephrine @ 8:15 AM           Slit Lamp and Fundus Exam     Slit Lamp Exam       Right Left   Lids/Lashes Dermatochalasis - upper lid, +hooding Dermatochalasis - upper lid, Meibomian gland dysfunction   Conjunctiva/Sclera White and quiet White and quiet   Cornea 2-3+ Punctate epithelial erosions, well healed cataract wound, irregular epi, decreased TBUT 2-3+ Punctate epithelial erosions, well healed cataract wound,  irregular epi, decreased TBUT   Anterior Chamber deep and clear deep and clear  Iris Round and moderately dilated Round and poorly dilated to 4.57mm   Lens PC IOL in good position with open PC PC IOL in good position with open PC   Anterior Vitreous Vitreous syneresis Vitreous syneresis         Fundus Exam       Right Left   Disc mild Pallor, Sharp rim, peripapillary CNV with mild cystic changes ST disc -- stable improved, no heme mild Pallor, Sharp rim; peripapillary CNV with mild cystic changes overlying - stably improved, no heme   C/D Ratio 0.4 0.4   Macula Flat, Blunted foveal reflex, drusen, central VMT with cystic changes -- persistent Flat, Blunted foveal reflex, fine Drusen, mild Retinal pigment epithelial mottling, no heme, focal edema nasal mac -- increased, central cystic changes (VMT)   Vessels attenuated, mild tortuosity attenuated, Tortuous   Periphery Attached, mild RPE changes, No heme Attached, no heme, mild RPE changes           Refraction     Wearing Rx       Sphere Cylinder Axis Add   Right -3.00 +3.00 005 +2.75   Left -1.50 +3.00 167 +2.75           IMAGING AND PROCEDURES  Imaging and Procedures for @TODAY @  OCT, Retina - OU - Both Eyes       Right Eye Quality was poor. Central Foveal Thickness: 347. Progression has been stable. Findings include no SRF, abnormal foveal contour, retinal drusen , subretinal hyper-reflective material, intraretinal hyper-reflective material, epiretinal membrane, intraretinal fluid, pigment epithelial detachment, vitreous traction, outer retinal atrophy (Persistent central VMT with cystic changes -- stable; peripapillary CNV with stable improvement in IRF/SRHM).   Left Eye Quality was borderline. Central Foveal Thickness: 304. Progression has worsened. Findings include abnormal foveal contour, retinal drusen , subretinal hyper-reflective material, intraretinal fluid, subretinal fluid, vitreous traction (central SRHM  (vitelliform lesion) with new focal pocket of SRF SN fovea and macula, +VMT; peripapillary SRHM with stable improvement in cystic changes overlying).   Notes *Images captured and stored on drive  Diagnosis / Impression:  OD: exudative ARMD -- Persistent central VMT with cystic changes -- stable; peripapillary CNV with stable improvement in IRF/SRHM OS: exudative ARMD -- central SRHM (vitelliform lesion) with new focal pocket of SRF SN fovea and macula, +VMT; peripapillary SRHM with stable improvement in cystic changes overlying  Clinical management: See below  Abbreviations: NFP - Normal foveal profile. CME - cystoid macular edema. PED - pigment epithelial detachment. IRF - intraretinal fluid. SRF - subretinal fluid. EZ - ellipsoid zone. ERM - epiretinal membrane. ORA - outer retinal atrophy. ORT - outer retinal tubulation. SRHM - subretinal hyper-reflective material      Intravitreal Injection, Pharmacologic Agent - OD - Right Eye       Time Out 10/13/2022. 8:54 AM. Confirmed correct patient, procedure, site, and patient consented.   Anesthesia Topical anesthesia was used. Anesthetic medications included Lidocaine 2%, Proparacaine 0.5%.   Procedure Preparation included 5% betadine to ocular surface, eyelid speculum. A (32g) needle was used.   Injection: 2 mg aflibercept 2 MG/0.05ML   Route: Intravitreal, Site: Right Eye   NDC: L6038910, Lot: [id number], Expiration date: 11/21/2023, Waste: 0 mL   Post-op Post injection exam found visual acuity of at least counting fingers. The patient tolerated the procedure well. There were no complications. The patient received written and verbal post procedure care education. Post injection medications were not given.      Intravitreal Injection, Pharmacologic Agent -  OS - Left Eye       Time Out 10/13/2022. 8:54 AM. Confirmed correct patient, procedure, site, and patient consented.   Anesthesia Topical anesthesia was used. Anesthetic  medications included Lidocaine 2%, Proparacaine 0.5%.   Procedure Preparation included 5% betadine to ocular surface, eyelid speculum. A (32g) needle was used.   Injection: 2 mg aflibercept 2 MG/0.05ML   Route: Intravitreal, Site: Left Eye   NDC: L6038910, Lot: [id number], Expiration date: 05/22/2023, Waste: 0 mL   Post-op Post injection exam found visual acuity of at least counting fingers. The patient tolerated the procedure well. There were no complications. The patient received written and verbal post procedure care education. Post injection medications were not given.            ASSESSMENT/PLAN:   ICD-10-CM   1. Exudative age-related macular degeneration of both eyes with active choroidal neovascularization (HCC)  H35.3231 OCT, Retina - OU - Both Eyes    Intravitreal Injection, Pharmacologic Agent - OD - Right Eye    Intravitreal Injection, Pharmacologic Agent - OS - Left Eye    aflibercept (EYLEA) SOLN 2 mg    aflibercept (EYLEA) SOLN 2 mg    2. Vitreomacular adhesion of both eyes  H43.823     3. Essential hypertension  I10     4. Hypertensive retinopathy of both eyes  H35.033     5. Pseudophakia of both eyes  Z96.1     6. Dry eyes  H04.123      1. Exudative age-related macular degeneration, both eyes  - delayed to follow up from 7 weeks to 9 weeks (01.25.24 to 03.28.24) due to transportation  - pt delayed to follow up from 12 weeks to 6 months due to emergency cholecystomy right before his last appt (June 2023 to Dec 2023)  - OD incidental discovery of peripapillary CNV w/ heme and SRF during emergency visit for corneal abrasion OS  - OS Interval conversion of Exudative ARMD noted on 09.22.22 visit (peripapillary CNV w/ SRF)             - S/P IVA OD #1 (03.22.21), #2 (04.19.21), #3 (05.26.21), #4 (06.23.21) -- IVA resistance  - s/p IVA OS #1 (11.22.22)  - S/P IVE OD #1 sample (07.22.21), #2 (08.24.21), #3 (10.13.21), #4 (11.18.21), #5 (12.30.21), #6 (02.24.22),  #7 (04.21.22), #8 (06.30.22), #9 (09.22.22), #10 (12.20.22), #11 (01.31.23), #12 (03.29.23), #13 (06.08.23), #14 (12.14.23), #15 (01.25.24), #16 (03.15.24), #17 (06.06.24) - s/p IVE OS #1 (09.22.22), #2 (10.21.22), #3 (12.20.22), #4 (01.31.23), #5 (03.29.23), #6 (06.08.23), #7 (12.14.23)  - BCVA OD 20/100 - stable; OS 20/40 - improved  - OCT shows OD: persistent central VMT with cystic changes -- stable; peripapillary CNV with stable improvement in IRF/SRHM at 11 wks since last IVE OD; OS: central SRHM (vitelliform lesion) with new focal pocket of SRF SN fovea and macula, +VMT; peripapillary SRHM with stable improvement in cystic changes overlying at 8 mos since last injection - recommend IVE OU (OD #18 and OS #8) today, 08.22.24 with follow up back to 4 weeks for OS **OD will be treated q12 weeks** - pt wishes to proceed with injections  - RBA of procedure discussed, questions answered  - IVE informed consent obtained, and re-signed, 12.14.23 OU  - see procedure note  - Eylea4U benefits approved for 2024  - f/u in 4 weeks -- DFE, OCT, possible injxns  2. VMT OU  - mild -- OCT stable OU  - review of OCTs since initial visit  shows progressive traction -- hopefully will release uneventfully soon  - monitor   3,4. Hypertensive retinopathy OU  - discussed importance of tight BP control  - monitor   5. Pseudophakia OU  - s/p CE/IOL OU  - IOLs in good position, doing well  - monitor   6. Dry eyes OU - recommend artificial tears and lubricating ointment as needed   Ophthalmic Meds Ordered this visit:  Meds ordered this encounter  Medications   aflibercept (EYLEA) SOLN 2 mg   aflibercept (EYLEA) SOLN 2 mg     Return in about 4 weeks (around 11/10/2022) for f/u exu ARMD OU, DFE, OCT.  There are no Patient Instructions on file for this visit.  Explained the diagnoses, plan, and follow up with the patient and they expressed understanding.  Patient expressed understanding of the  importance of proper follow up care.   This document serves as a record of services personally performed by Karie Chimera, MD, PhD. It was created on their behalf by Glee Arvin. Manson Passey, OA an ophthalmic technician. The creation of this record is the provider's dictation and/or activities during the visit.    Electronically signed by: Glee Arvin. Manson Passey, OA 10/13/22 10:34 AM  Karie Chimera, M.D., Ph.D. Diseases & Surgery of the Retina and Vitreous [facility]  I have reviewed the above documentation for accuracy and completeness, and I agree with the above. Karie Chimera, M.D., Ph.D. 10/13/22 10:35 AM  Abbreviations: M myopia (nearsighted); A astigmatism; H hyperopia (farsighted); P presbyopia; Mrx spectacle prescription;  CTL contact lenses; OD right eye; OS left eye; OU both eyes  XT exotropia; ET esotropia; PEK punctate epithelial keratitis; PEE punctate epithelial erosions; DES dry eye syndrome; MGD meibomian gland dysfunction; ATs artificial tears; PFAT's preservative free artificial tears; NSC nuclear sclerotic cataract; PSC posterior subcapsular cataract; ERM epi-retinal membrane; PVD posterior vitreous detachment; RD retinal detachment; DM diabetes mellitus; DR diabetic retinopathy; NPDR non-proliferative diabetic retinopathy; PDR proliferative diabetic retinopathy; CSME clinically significant macular edema; DME diabetic macular edema; dbh dot blot hemorrhages; CWS cotton wool spot; POAG primary open angle glaucoma; C/D cup-to-disc ratio; HVF humphrey visual field; GVF goldmann visual field; OCT optical coherence tomography; IOP intraocular pressure; BRVO Branch retinal vein occlusion; CRVO central retinal vein occlusion; CRAO central retinal artery occlusion; BRAO branch retinal artery occlusion; RT retinal tear; SB scleral buckle; PPV pars plana vitrectomy; VH Vitreous hemorrhage; PRP panretinal laser photocoagulation; IVK intravitreal kenalog; VMT vitreomacular traction;  MH Macular hole;  NVD neovascularization of the disc; NVE neovascularization elsewhere; AREDS age related eye disease study; ARMD age related macular degeneration; POAG primary open angle glaucoma; EBMD epithelial/anterior basement membrane dystrophy; ACIOL anterior chamber intraocular lens; IOL intraocular lens; PCIOL posterior chamber intraocular lens; Phaco/IOL phacoemulsification with intraocular lens placement; PRK photorefractive keratectomy; LASIK laser assisted in situ keratomileusis; HTN hypertension; DM diabetes mellitus; COPD chronic obstructive pulmonary disease

## 2022-10-04 DIAGNOSIS — K649 Unspecified hemorrhoids: Secondary | ICD-10-CM | POA: Diagnosis not present

## 2022-10-04 DIAGNOSIS — R600 Localized edema: Secondary | ICD-10-CM | POA: Diagnosis not present

## 2022-10-04 DIAGNOSIS — R1310 Dysphagia, unspecified: Secondary | ICD-10-CM | POA: Diagnosis not present

## 2022-10-04 DIAGNOSIS — Z87440 Personal history of urinary (tract) infections: Secondary | ICD-10-CM | POA: Diagnosis not present

## 2022-10-04 DIAGNOSIS — M545 Low back pain, unspecified: Secondary | ICD-10-CM | POA: Diagnosis not present

## 2022-10-04 DIAGNOSIS — I4891 Unspecified atrial fibrillation: Secondary | ICD-10-CM | POA: Diagnosis not present

## 2022-10-04 DIAGNOSIS — E039 Hypothyroidism, unspecified: Secondary | ICD-10-CM | POA: Diagnosis not present

## 2022-10-04 DIAGNOSIS — E559 Vitamin D deficiency, unspecified: Secondary | ICD-10-CM | POA: Diagnosis not present

## 2022-10-04 DIAGNOSIS — I10 Essential (primary) hypertension: Secondary | ICD-10-CM | POA: Diagnosis not present

## 2022-10-04 DIAGNOSIS — K589 Irritable bowel syndrome without diarrhea: Secondary | ICD-10-CM | POA: Diagnosis not present

## 2022-10-04 DIAGNOSIS — D500 Iron deficiency anemia secondary to blood loss (chronic): Secondary | ICD-10-CM | POA: Diagnosis not present

## 2022-10-04 DIAGNOSIS — R634 Abnormal weight loss: Secondary | ICD-10-CM | POA: Diagnosis not present

## 2022-10-04 DIAGNOSIS — M170 Bilateral primary osteoarthritis of knee: Secondary | ICD-10-CM | POA: Diagnosis not present

## 2022-10-04 DIAGNOSIS — E785 Hyperlipidemia, unspecified: Secondary | ICD-10-CM | POA: Diagnosis not present

## 2022-10-04 DIAGNOSIS — J449 Chronic obstructive pulmonary disease, unspecified: Secondary | ICD-10-CM | POA: Diagnosis not present

## 2022-10-04 DIAGNOSIS — J90 Pleural effusion, not elsewhere classified: Secondary | ICD-10-CM | POA: Diagnosis not present

## 2022-10-04 DIAGNOSIS — I2510 Atherosclerotic heart disease of native coronary artery without angina pectoris: Secondary | ICD-10-CM | POA: Diagnosis not present

## 2022-10-04 DIAGNOSIS — G8929 Other chronic pain: Secondary | ICD-10-CM | POA: Diagnosis not present

## 2022-10-04 DIAGNOSIS — G629 Polyneuropathy, unspecified: Secondary | ICD-10-CM | POA: Diagnosis not present

## 2022-10-04 DIAGNOSIS — D510 Vitamin B12 deficiency anemia due to intrinsic factor deficiency: Secondary | ICD-10-CM | POA: Diagnosis not present

## 2022-10-04 DIAGNOSIS — N289 Disorder of kidney and ureter, unspecified: Secondary | ICD-10-CM | POA: Diagnosis not present

## 2022-10-04 DIAGNOSIS — I071 Rheumatic tricuspid insufficiency: Secondary | ICD-10-CM | POA: Diagnosis not present

## 2022-10-05 DIAGNOSIS — R339 Retention of urine, unspecified: Secondary | ICD-10-CM | POA: Diagnosis not present

## 2022-10-05 DIAGNOSIS — K5900 Constipation, unspecified: Secondary | ICD-10-CM | POA: Diagnosis not present

## 2022-10-09 DIAGNOSIS — J449 Chronic obstructive pulmonary disease, unspecified: Secondary | ICD-10-CM | POA: Diagnosis not present

## 2022-10-11 DIAGNOSIS — J449 Chronic obstructive pulmonary disease, unspecified: Secondary | ICD-10-CM | POA: Diagnosis not present

## 2022-10-11 DIAGNOSIS — G629 Polyneuropathy, unspecified: Secondary | ICD-10-CM | POA: Diagnosis not present

## 2022-10-11 DIAGNOSIS — D500 Iron deficiency anemia secondary to blood loss (chronic): Secondary | ICD-10-CM | POA: Diagnosis not present

## 2022-10-11 DIAGNOSIS — Z87440 Personal history of urinary (tract) infections: Secondary | ICD-10-CM | POA: Diagnosis not present

## 2022-10-11 DIAGNOSIS — N289 Disorder of kidney and ureter, unspecified: Secondary | ICD-10-CM | POA: Diagnosis not present

## 2022-10-11 DIAGNOSIS — I071 Rheumatic tricuspid insufficiency: Secondary | ICD-10-CM | POA: Diagnosis not present

## 2022-10-11 DIAGNOSIS — J90 Pleural effusion, not elsewhere classified: Secondary | ICD-10-CM | POA: Diagnosis not present

## 2022-10-11 DIAGNOSIS — I10 Essential (primary) hypertension: Secondary | ICD-10-CM | POA: Diagnosis not present

## 2022-10-11 DIAGNOSIS — M170 Bilateral primary osteoarthritis of knee: Secondary | ICD-10-CM | POA: Diagnosis not present

## 2022-10-11 DIAGNOSIS — G8929 Other chronic pain: Secondary | ICD-10-CM | POA: Diagnosis not present

## 2022-10-11 DIAGNOSIS — D510 Vitamin B12 deficiency anemia due to intrinsic factor deficiency: Secondary | ICD-10-CM | POA: Diagnosis not present

## 2022-10-11 DIAGNOSIS — R1310 Dysphagia, unspecified: Secondary | ICD-10-CM | POA: Diagnosis not present

## 2022-10-11 DIAGNOSIS — E559 Vitamin D deficiency, unspecified: Secondary | ICD-10-CM | POA: Diagnosis not present

## 2022-10-11 DIAGNOSIS — M545 Low back pain, unspecified: Secondary | ICD-10-CM | POA: Diagnosis not present

## 2022-10-11 DIAGNOSIS — E785 Hyperlipidemia, unspecified: Secondary | ICD-10-CM | POA: Diagnosis not present

## 2022-10-11 DIAGNOSIS — I4891 Unspecified atrial fibrillation: Secondary | ICD-10-CM | POA: Diagnosis not present

## 2022-10-11 DIAGNOSIS — K649 Unspecified hemorrhoids: Secondary | ICD-10-CM | POA: Diagnosis not present

## 2022-10-11 DIAGNOSIS — I2510 Atherosclerotic heart disease of native coronary artery without angina pectoris: Secondary | ICD-10-CM | POA: Diagnosis not present

## 2022-10-11 DIAGNOSIS — R634 Abnormal weight loss: Secondary | ICD-10-CM | POA: Diagnosis not present

## 2022-10-11 DIAGNOSIS — K589 Irritable bowel syndrome without diarrhea: Secondary | ICD-10-CM | POA: Diagnosis not present

## 2022-10-11 DIAGNOSIS — R600 Localized edema: Secondary | ICD-10-CM | POA: Diagnosis not present

## 2022-10-11 DIAGNOSIS — E039 Hypothyroidism, unspecified: Secondary | ICD-10-CM | POA: Diagnosis not present

## 2022-10-13 ENCOUNTER — Ambulatory Visit (INDEPENDENT_AMBULATORY_CARE_PROVIDER_SITE_OTHER): Payer: Medicare Other | Admitting: Ophthalmology

## 2022-10-13 ENCOUNTER — Encounter (INDEPENDENT_AMBULATORY_CARE_PROVIDER_SITE_OTHER): Payer: Self-pay | Admitting: Ophthalmology

## 2022-10-13 DIAGNOSIS — H04123 Dry eye syndrome of bilateral lacrimal glands: Secondary | ICD-10-CM

## 2022-10-13 DIAGNOSIS — H35033 Hypertensive retinopathy, bilateral: Secondary | ICD-10-CM | POA: Diagnosis not present

## 2022-10-13 DIAGNOSIS — Z961 Presence of intraocular lens: Secondary | ICD-10-CM | POA: Diagnosis not present

## 2022-10-13 DIAGNOSIS — I10 Essential (primary) hypertension: Secondary | ICD-10-CM | POA: Diagnosis not present

## 2022-10-13 DIAGNOSIS — H43823 Vitreomacular adhesion, bilateral: Secondary | ICD-10-CM

## 2022-10-13 DIAGNOSIS — H353231 Exudative age-related macular degeneration, bilateral, with active choroidal neovascularization: Secondary | ICD-10-CM | POA: Diagnosis not present

## 2022-10-13 MED ORDER — AFLIBERCEPT 2MG/0.05ML IZ SOLN FOR KALEIDOSCOPE
2.0000 mg | INTRAVITREAL | Status: AC | PRN
  Administered 2022-10-13: 2 mg via INTRAVITREAL

## 2022-10-14 DIAGNOSIS — R339 Retention of urine, unspecified: Secondary | ICD-10-CM | POA: Diagnosis not present

## 2022-10-17 DIAGNOSIS — R600 Localized edema: Secondary | ICD-10-CM | POA: Diagnosis not present

## 2022-10-17 DIAGNOSIS — J449 Chronic obstructive pulmonary disease, unspecified: Secondary | ICD-10-CM | POA: Diagnosis not present

## 2022-10-17 DIAGNOSIS — D510 Vitamin B12 deficiency anemia due to intrinsic factor deficiency: Secondary | ICD-10-CM | POA: Diagnosis not present

## 2022-10-17 DIAGNOSIS — E559 Vitamin D deficiency, unspecified: Secondary | ICD-10-CM | POA: Diagnosis not present

## 2022-10-17 DIAGNOSIS — N289 Disorder of kidney and ureter, unspecified: Secondary | ICD-10-CM | POA: Diagnosis not present

## 2022-10-17 DIAGNOSIS — G8929 Other chronic pain: Secondary | ICD-10-CM | POA: Diagnosis not present

## 2022-10-17 DIAGNOSIS — M545 Low back pain, unspecified: Secondary | ICD-10-CM | POA: Diagnosis not present

## 2022-10-17 DIAGNOSIS — R5383 Other fatigue: Secondary | ICD-10-CM | POA: Diagnosis not present

## 2022-10-17 DIAGNOSIS — I2510 Atherosclerotic heart disease of native coronary artery without angina pectoris: Secondary | ICD-10-CM | POA: Diagnosis not present

## 2022-10-17 DIAGNOSIS — I071 Rheumatic tricuspid insufficiency: Secondary | ICD-10-CM | POA: Diagnosis not present

## 2022-10-17 DIAGNOSIS — K5909 Other constipation: Secondary | ICD-10-CM | POA: Diagnosis not present

## 2022-10-17 DIAGNOSIS — K589 Irritable bowel syndrome without diarrhea: Secondary | ICD-10-CM | POA: Diagnosis not present

## 2022-10-17 DIAGNOSIS — J90 Pleural effusion, not elsewhere classified: Secondary | ICD-10-CM | POA: Diagnosis not present

## 2022-10-17 DIAGNOSIS — R634 Abnormal weight loss: Secondary | ICD-10-CM | POA: Diagnosis not present

## 2022-10-17 DIAGNOSIS — E785 Hyperlipidemia, unspecified: Secondary | ICD-10-CM | POA: Diagnosis not present

## 2022-10-17 DIAGNOSIS — I10 Essential (primary) hypertension: Secondary | ICD-10-CM | POA: Diagnosis not present

## 2022-10-17 DIAGNOSIS — K649 Unspecified hemorrhoids: Secondary | ICD-10-CM | POA: Diagnosis not present

## 2022-10-17 DIAGNOSIS — Z87440 Personal history of urinary (tract) infections: Secondary | ICD-10-CM | POA: Diagnosis not present

## 2022-10-17 DIAGNOSIS — G629 Polyneuropathy, unspecified: Secondary | ICD-10-CM | POA: Diagnosis not present

## 2022-10-17 DIAGNOSIS — M170 Bilateral primary osteoarthritis of knee: Secondary | ICD-10-CM | POA: Diagnosis not present

## 2022-10-17 DIAGNOSIS — I4891 Unspecified atrial fibrillation: Secondary | ICD-10-CM | POA: Diagnosis not present

## 2022-10-17 DIAGNOSIS — D500 Iron deficiency anemia secondary to blood loss (chronic): Secondary | ICD-10-CM | POA: Diagnosis not present

## 2022-10-17 DIAGNOSIS — E039 Hypothyroidism, unspecified: Secondary | ICD-10-CM | POA: Diagnosis not present

## 2022-10-17 DIAGNOSIS — R1310 Dysphagia, unspecified: Secondary | ICD-10-CM | POA: Diagnosis not present

## 2022-10-20 ENCOUNTER — Other Ambulatory Visit: Payer: Self-pay

## 2022-10-25 ENCOUNTER — Encounter: Payer: Self-pay | Admitting: Cardiology

## 2022-10-25 ENCOUNTER — Ambulatory Visit: Payer: Medicare Other | Attending: Cardiology | Admitting: Cardiology

## 2022-10-25 ENCOUNTER — Ambulatory Visit: Payer: Medicare Other

## 2022-10-25 VITALS — BP 130/74 | HR 99 | Ht 74.0 in | Wt 170.4 lb

## 2022-10-25 DIAGNOSIS — R1310 Dysphagia, unspecified: Secondary | ICD-10-CM | POA: Diagnosis not present

## 2022-10-25 DIAGNOSIS — R002 Palpitations: Secondary | ICD-10-CM | POA: Diagnosis not present

## 2022-10-25 DIAGNOSIS — D510 Vitamin B12 deficiency anemia due to intrinsic factor deficiency: Secondary | ICD-10-CM | POA: Diagnosis not present

## 2022-10-25 DIAGNOSIS — G629 Polyneuropathy, unspecified: Secondary | ICD-10-CM | POA: Diagnosis not present

## 2022-10-25 DIAGNOSIS — R634 Abnormal weight loss: Secondary | ICD-10-CM | POA: Diagnosis not present

## 2022-10-25 DIAGNOSIS — J811 Chronic pulmonary edema: Secondary | ICD-10-CM | POA: Diagnosis not present

## 2022-10-25 DIAGNOSIS — K589 Irritable bowel syndrome without diarrhea: Secondary | ICD-10-CM | POA: Diagnosis not present

## 2022-10-25 DIAGNOSIS — E785 Hyperlipidemia, unspecified: Secondary | ICD-10-CM | POA: Diagnosis not present

## 2022-10-25 DIAGNOSIS — I4891 Unspecified atrial fibrillation: Secondary | ICD-10-CM | POA: Diagnosis not present

## 2022-10-25 DIAGNOSIS — I4821 Permanent atrial fibrillation: Secondary | ICD-10-CM | POA: Diagnosis not present

## 2022-10-25 DIAGNOSIS — E559 Vitamin D deficiency, unspecified: Secondary | ICD-10-CM | POA: Diagnosis not present

## 2022-10-25 DIAGNOSIS — R600 Localized edema: Secondary | ICD-10-CM | POA: Diagnosis not present

## 2022-10-25 DIAGNOSIS — I10 Essential (primary) hypertension: Secondary | ICD-10-CM | POA: Diagnosis not present

## 2022-10-25 DIAGNOSIS — I959 Hypotension, unspecified: Secondary | ICD-10-CM | POA: Diagnosis not present

## 2022-10-25 DIAGNOSIS — K649 Unspecified hemorrhoids: Secondary | ICD-10-CM | POA: Diagnosis not present

## 2022-10-25 DIAGNOSIS — J449 Chronic obstructive pulmonary disease, unspecified: Secondary | ICD-10-CM | POA: Diagnosis not present

## 2022-10-25 DIAGNOSIS — J90 Pleural effusion, not elsewhere classified: Secondary | ICD-10-CM | POA: Diagnosis not present

## 2022-10-25 DIAGNOSIS — Z87440 Personal history of urinary (tract) infections: Secondary | ICD-10-CM | POA: Diagnosis not present

## 2022-10-25 DIAGNOSIS — I071 Rheumatic tricuspid insufficiency: Secondary | ICD-10-CM | POA: Diagnosis not present

## 2022-10-25 DIAGNOSIS — E039 Hypothyroidism, unspecified: Secondary | ICD-10-CM | POA: Diagnosis not present

## 2022-10-25 DIAGNOSIS — M170 Bilateral primary osteoarthritis of knee: Secondary | ICD-10-CM | POA: Diagnosis not present

## 2022-10-25 DIAGNOSIS — N289 Disorder of kidney and ureter, unspecified: Secondary | ICD-10-CM

## 2022-10-25 DIAGNOSIS — G8929 Other chronic pain: Secondary | ICD-10-CM | POA: Diagnosis not present

## 2022-10-25 DIAGNOSIS — D500 Iron deficiency anemia secondary to blood loss (chronic): Secondary | ICD-10-CM | POA: Diagnosis not present

## 2022-10-25 DIAGNOSIS — Z95818 Presence of other cardiac implants and grafts: Secondary | ICD-10-CM

## 2022-10-25 DIAGNOSIS — M545 Low back pain, unspecified: Secondary | ICD-10-CM | POA: Diagnosis not present

## 2022-10-25 DIAGNOSIS — I2510 Atherosclerotic heart disease of native coronary artery without angina pectoris: Secondary | ICD-10-CM | POA: Diagnosis not present

## 2022-10-25 HISTORY — DX: Disorder of kidney and ureter, unspecified: N28.9

## 2022-10-25 HISTORY — DX: Palpitations: R00.2

## 2022-10-25 HISTORY — DX: Pleural effusion, not elsewhere classified: J90

## 2022-10-25 NOTE — Patient Instructions (Signed)
Medication Instructions:  Your physician recommends that you continue on your current medications as directed. Please refer to the Current Medication list given to you today.  *If you need a refill on your cardiac medications before your next appointment, please call your pharmacy*   Lab Work: None ordered If you have labs (blood work) drawn today and your tests are completely normal, you will receive your results only by: MyChart Message (if you have MyChart) OR A paper copy in the mail If you have any lab test that is abnormal or we need to change your treatment, we will call you to review the results.   Testing/Procedures: A zio monitor was ordered today. It will remain on for 14 days. Remove 11/08/22. You will then return monitor and event diary in provided box. It takes 1-2 weeks for report to be downloaded and returned to [place]. We will call you with the results. If monitor falls off or has orange flashing light, please call Zio for further instructions. 11/08/22   Follow-Up: At [facility], you and your health needs are our priority.  As part of our continuing mission to provide you with exceptional heart care, we have created designated Provider Care Teams.  These Care Teams include your primary Cardiologist (physician) and Advanced Practice Providers (APPs -  Physician Assistants and Nurse Practitioners) who all work together to provide you with the care you need, when you need it.  We recommend signing up for the patient portal called "MyChart".  Sign up information is provided on this After Visit Summary.  MyChart is used to connect with patients for Virtual Visits (Telemedicine).  Patients are able to view lab/test results, encounter notes, upcoming appointments, etc.  Non-urgent messages can be sent to your provider as well.   To learn more about what you can do with MyChart, go to [email or web address].    Your next appointment:   6 month(s)  The format for your next  appointment:   In Person  Provider:   Belva Crome, MD    Other Instructions none  Important Information About Sugar

## 2022-10-25 NOTE — Progress Notes (Signed)
Cardiology Office Note:    Date:  10/25/2022   ID:  Eddie Kelly, DOB 05/27/1925, MRN [id number]  PCP:  [name], MD  Cardiologist:  Garwin Brothers, MD   Referring MD: [name], MD    ASSESSMENT:    1. Permanent atrial fibrillation (HCC)   2. Pleural effusion   3. Presence of Watchman left atrial appendage closure device   4. Renal insufficiency   5. Palpitations    PLAN:    In order of problems listed above:  Coronary artery disease: Stable and asymptomatic. Essential hypertension: Blood pressure stable and diet was emphasized. Pleural effusion: Noted on x-ray done in July.  I still feel that sounds at the base of the right lung especially diminished.  Will do a follow-up x-ray.  He is on diuretic therapy. Permanent atrial fibrillation: Post Watchman device.  Stable at this time. Patient will be seen in follow-up appointment in 6 months or earlier if the patient has any concerns.    Medication Adjustments/Labs and Tests Ordered: Current medicines are reviewed at length with the patient today.  Concerns regarding medicines are outlined above.  Orders Placed This Encounter  Procedures   EKG 12-Lead   No orders of the defined types were placed in this encounter.    No chief complaint on file.    History of Present Illness:    Eddie Kelly is a 89 y.o. male.  Patient has past medical history of permanent atrial fibrillation.  Patient is post Watchman device, coronary artery disease, essential hypertension.  He denies any problems at this time.  I have x-ray report discussing pleural effusion.  Patient denies any shortness of breath or any such issues.  Past Medical History:  Diagnosis Date   A-fib [facility])    Anxiety and depression    Aspiration pneumonia (HCC) 06/27/2019   CAD (coronary artery disease)    Chronic atrial fibrillation (HCC) 09/06/2019   Epistaxis 06/27/2019   Essential hypertension 04/12/2018   Hypertension 09/11/2019   Hypertensive  retinopathy    OU   Hypothyroidism    Left inguinal hernia 08/10/2017   Macular degeneration    Wet OD; Dry OS   Permanent atrial fibrillation (HCC) 01/14/2019   Postoperative examination 11/29/2017   Preoperative cardiovascular examination 09/26/2017   Presence of Watchman left atrial appendage closure device 02/04/2020   PVC (premature ventricular contraction)    Sinus bradycardia    Vitamin D deficiency     Past Surgical History:  Procedure Laterality Date   CATARACT EXTRACTION Bilateral    CHOLECYSTECTOMY     [facility] [place]   EYE SURGERY Bilateral    Cat Sx   HERNIA REPAIR      Current Medications: Current Meds  Medication Sig   aspirin EC 81 MG tablet Take 81 mg by mouth daily.   bethanechol (URECHOLINE) 25 MG tablet Take 25 mg by mouth 2 (two) times daily.   buPROPion (WELLBUTRIN XL) 150 MG 24 hr tablet Take 300 mg by mouth daily.   cyanocobalamin (,VITAMIN B-12,) 1000 MCG/ML injection Inject 1,000 mcg into the muscle every 30 (thirty) days.   diclofenac Sodium (VOLTAREN) 1 % GEL Apply 1 application topically 3 (three) times daily as needed for pain.   ferrous sulfate 325 (65 FE) MG EC tablet Take 325 mg by mouth 3 (three) times daily with meals.   finasteride (PROSCAR) 5 MG tablet Take 5 mg by mouth daily.   GLUCOSAMINE-CHONDROITIN PO Take 2 tablets by mouth  daily.   levothyroxine (SYNTHROID) 100 MCG tablet Take 100 mcg by mouth daily.   loratadine (CLARITIN) 10 MG tablet Take 10 mg by mouth daily.   memantine (NAMENDA) 10 MG tablet Take 10 mg by mouth daily.   montelukast (SINGULAIR) 10 MG tablet Take 10 mg by mouth at bedtime.   Multiple Vitamin (MULTIVITAMIN WITH MINERALS) TABS tablet Take 1 tablet by mouth daily.   Multiple Vitamins-Minerals (CENTRUM SILVER PO) Take 1 tablet by mouth daily.   nitroGLYCERIN (NITROSTAT) 0.4 MG SL tablet Place 0.4 mg under the tongue every 5 (five) minutes as needed for chest pain.   Probiotic Product (PROBIOTIC DAILY  PO) Take 2 capsules by mouth daily.   saw palmetto 80 MG capsule Take 80 mg by mouth daily.   tamsulosin (FLOMAX) 0.4 MG CAPS capsule Take 1 capsule by mouth daily.   torsemide (DEMADEX) 20 MG tablet Take 10 mg by mouth daily.     Allergies:   Ciprofloxacin and Levofloxacin   Social History   Socioeconomic History   Marital status: Married    Spouse name: Not on file   Number of children: Not on file   Years of education: Not on file   Highest education level: Not on file  Occupational History   Not on file  Tobacco Use   Smoking status: Former    Current packs/day: 0.00    Types: Cigarettes    Quit date: 29    Years since quitting: 41.7   Smokeless tobacco: Never  Vaping Use   Vaping status: Never Used  Substance and Sexual Activity   Alcohol use: Not Currently    Alcohol/week: 1.0 standard drink of alcohol    Types: 1 Cans of beer per week    Comment: "every once in a blue moon"   Drug use: No   Sexual activity: Not Currently  Other Topics Concern   Not on file  Social History Narrative   Not on file   Social Determinants of Health   Financial Resource Strain: Low Risk  (11/07/2019)   Received from [facility], [facility]   Overall Financial Resource Strain (CARDIA)    Difficulty of Paying Living Expenses: Not hard at all  Food Insecurity: No Food Insecurity (11/07/2019)   Received from [facility], [facility]   Hunger Vital Sign    Worried About Running Out of Food in the Last Year: Never true    Ran Out of Food in the Last Year: Never true  Transportation Needs: No Transportation Needs (11/07/2019)   Received from [facility], [facility]   PRAPARE - Transportation    Lack of Transportation (Medical): No    Lack of Transportation (Non-Medical): No  Physical Activity: Not on file  Stress: Not on file  Social Connections: Not on file     Family History: The patient's family history includes Cancer in his father; Colon cancer in  his father; Diabetes in his brother; Heart attack in his father; Heart disease in his brother. There is no history of Rectal cancer or Stomach cancer.  ROS:   Please see the history of present illness.    All other systems reviewed and are negative.  EKGs/Labs/Other Studies Reviewed:    The following studies were reviewed today: EKG Interpretation Date/Time:  Tuesday October 25 2022 11:29:49 EDT Ventricular Rate:  110 PR Interval:    QRS Duration:  98 QT Interval:  340 QTC Calculation: 460 R Axis:   -64  Text Interpretation: Atrial fibrillation Left axis deviation Anteroseptal infarct , age undetermined Abnormal ECG No previous ECGs available Confirmed by Belva Crome [phone]) on 10/25/2022 11:43:28 AM     Recent Labs: No results found for requested labs within last 365 days.  Recent Lipid Panel    Component Value Date/Time   CHOL 149 01/14/2019 1055   TRIG 54 01/14/2019 1055   HDL 45 01/14/2019 1055   CHOLHDL 3.3 01/14/2019 1055   LDLCALC 93 01/14/2019 1055    Physical Exam:    VS:  BP 130/74   Pulse 99   Ht 6\' 2"  (1.88 m)   Wt 170 lb 6.4 oz (77.3 kg)   SpO2 95%   BMI 21.88 kg/m     Wt Readings from Last 3 Encounters:  10/25/22 170 lb 6.4 oz (77.3 kg)  04/07/22 178 lb 8 oz (81 kg)  09/09/20 187 lb (84.8 kg)     GEN: Patient is in no acute distress HEENT: Normal NECK: No JVD; No carotid bruits LYMPHATICS: No lymphadenopathy CARDIAC: Hear sounds regular, 2/6 systolic murmur at the apex. RESPIRATORY:  Clear to auscultation without rales, wheezing or rhonchi.  Reduced sounds at right base. ABDOMEN: Soft, non-tender, non-distended MUSCULOSKELETAL:  No edema; No deformity  SKIN: Warm and dry NEUROLOGIC:  Alert and oriented x 3 PSYCHIATRIC:  Normal affect   Signed, Garwin Brothers, MD  10/25/2022 11:54 AM    [facility]

## 2022-10-27 NOTE — Progress Notes (Signed)
[facility] - Clinic Note  11/10/2022     CHIEF COMPLAINT Patient presents for Retina Follow Up   HISTORY OF PRESENT ILLNESS: Eddie Kelly is a 89 y.o. male who presents to the clinic today for:   HPI     Retina Follow Up   Patient presents with  Wet AMD.  In both eyes.  This started 4 weeks ago.  Duration of 4 weeks.  Since onset it is stable.  I, the attending physician,  performed the HPI with the patient and updated documentation appropriately.        Comments   4 week retina follow up ARMD and I'VE OS pt daughter in law states he has been saying he can not reading as well he denies any flashes or floaters       Last edited by [name], MD on 11/10/2022 10:48 AM.    Pt states vision is stable, he is not using any drops, but his eyes do not feel dry to him   Referring physician: [name], MD [address] [place],  [place] [address]  HISTORICAL INFORMATION:   Selected notes from the MEDICAL RECORD NUMBER Referred by Dr. Zetta Bills for concern of CNVM OD LEE: 05/04/2019 BCVA: 20/70-1            20/50+1   CURRENT MEDICATIONS: No current outpatient medications on file. (Ophthalmic Drugs)   No current facility-administered medications for this visit. (Ophthalmic Drugs)   Current Outpatient Medications (Other)  Medication Sig   aspirin EC 81 MG tablet Take 81 mg by mouth daily.   bethanechol (URECHOLINE) 25 MG tablet Take 25 mg by mouth 2 (two) times daily.   buPROPion (WELLBUTRIN XL) 150 MG 24 hr tablet Take 300 mg by mouth daily.   cyanocobalamin (,VITAMIN B-12,) 1000 MCG/ML injection Inject 1,000 mcg into the muscle every 30 (thirty) days.   diclofenac Sodium (VOLTAREN) 1 % GEL Apply 1 application topically 3 (three) times daily as needed for pain.   ferrous sulfate 325 (65 FE) MG EC tablet Take 325 mg by mouth 3 (three) times daily with meals.   finasteride (PROSCAR) 5 MG tablet Take 5 mg by mouth daily.    GLUCOSAMINE-CHONDROITIN PO Take 2 tablets by mouth daily.   levothyroxine (SYNTHROID) 100 MCG tablet Take 100 mcg by mouth daily.   loratadine (CLARITIN) 10 MG tablet Take 10 mg by mouth daily.   memantine (NAMENDA) 10 MG tablet Take 10 mg by mouth daily.   montelukast (SINGULAIR) 10 MG tablet Take 10 mg by mouth at bedtime.   Multiple Vitamin (MULTIVITAMIN WITH MINERALS) TABS tablet Take 1 tablet by mouth daily.   Multiple Vitamins-Minerals (CENTRUM SILVER PO) Take 1 tablet by mouth daily.   nitroGLYCERIN (NITROSTAT) 0.4 MG SL tablet Place 0.4 mg under the tongue every 5 (five) minutes as needed for chest pain.   Probiotic Product (PROBIOTIC DAILY PO) Take 2 capsules by mouth daily.   saw palmetto 80 MG capsule Take 80 mg by mouth daily.   tamsulosin (FLOMAX) 0.4 MG CAPS capsule Take 1 capsule by mouth daily.   torsemide (DEMADEX) 20 MG tablet Take 10 mg by mouth daily.   No current facility-administered medications for this visit. (Other)   REVIEW OF SYSTEMS: ROS   Positive for: Neurological, Cardiovascular, Eyes Negative for: Constitutional, Gastrointestinal, Skin, Genitourinary, Musculoskeletal, HENT, Endocrine, Respiratory, Psychiatric, Allergic/Imm, Heme/Lymph Last edited by Etheleen Mayhew, COT on 11/10/2022  8:29 AM.  ALLERGIES Allergies  Allergen Reactions   Ciprofloxacin Other (See Comments)    Edema, generalized   Levofloxacin    PAST MEDICAL HISTORY Past Medical History:  Diagnosis Date   A-fib (HCC)    Anxiety and depression    Aspiration pneumonia (HCC) 06/27/2019   CAD (coronary artery disease)    Chronic atrial fibrillation (HCC) 09/06/2019   Epistaxis 06/27/2019   Essential hypertension 04/12/2018   Hypertension 09/11/2019   Hypertensive retinopathy    OU   Hypothyroidism    Left inguinal hernia 08/10/2017   Macular degeneration    Wet OD; Dry OS   Permanent atrial fibrillation (HCC) 01/14/2019   Postoperative examination 11/29/2017   Preoperative  cardiovascular examination 09/26/2017   Presence of Watchman left atrial appendage closure device 02/04/2020   PVC (premature ventricular contraction)    Sinus bradycardia    Vitamin D deficiency    Past Surgical History:  Procedure Laterality Date   CATARACT EXTRACTION Bilateral    CHOLECYSTECTOMY     [facility] [place]   EYE SURGERY Bilateral    Cat Sx   HERNIA REPAIR     FAMILY HISTORY Family History  Problem Relation Age of Onset   Cancer Father    Heart attack Father    Colon cancer Father    Heart disease Brother    Diabetes Brother    Rectal cancer Neg Hx    Stomach cancer Neg Hx    SOCIAL HISTORY Social History   Tobacco Use   Smoking status: Former    Current packs/day: 0.00    Types: Cigarettes    Quit date: 1983    Years since quitting: 41.7   Smokeless tobacco: Never  Vaping Use   Vaping status: Never Used  Substance Use Topics   Alcohol use: Not Currently    Alcohol/week: 1.0 standard drink of alcohol    Types: 1 Cans of beer per week    Comment: "every once in a blue moon"   Drug use: No       OPHTHALMIC EXAM: Base Eye Exam     Visual Acuity (Snellen - Linear)       Right Left   Dist cc 20/150 -3 20/50 -2   Dist ph cc NI NI         Tonometry (Tonopen, 8:36 AM)       Right Left   Pressure 6 11         Pupils       Pupils Dark Light Shape React APD   Right PERRL 4 2 Round Brisk None   Left PERRL 4 2 Round Brisk None         Visual Fields       Left Right    Full Full         Extraocular Movement       Right Left    Full, Ortho Full, Ortho         Neuro/Psych     Oriented x3: Yes   Mood/Affect: Normal         Dilation     Both eyes: 2.5% Phenylephrine @ 8:36 AM           Slit Lamp and Fundus Exam     Slit Lamp Exam       Right Left   Lids/Lashes Dermatochalasis - upper lid, +hooding Dermatochalasis - upper lid, Meibomian gland dysfunction   Conjunctiva/Sclera White and quiet White  and quiet   Cornea 2-3+ Punctate  epithelial erosions, well healed cataract wound, irregular epi, decreased TBUT 2-3+ Punctate epithelial erosions, well healed cataract wound, irregular epi, decreased TBUT   Anterior Chamber deep and clear deep and clear   Iris Round and moderately dilated Round and poorly dilated to 4.41mm   Lens PC IOL in good position with open PC PC IOL in good position with open PC   Anterior Vitreous Vitreous syneresis Vitreous syneresis         Fundus Exam       Right Left   Disc mild Pallor, Sharp rim, peripapillary CNV with mild cystic changes ST disc -- stably improved, no heme mild Pallor, Sharp rim; peripapillary CNV with mild cystic changes overlying - stably improved, no heme   C/D Ratio 0.4 0.4   Macula Flat, Blunted foveal reflex, drusen, central VMT with cystic changes -- persistent Flat, Blunted foveal reflex, fine Drusen, mild Retinal pigment epithelial mottling, no heme, focal edema nasal mac -- improved, +VMT   Vessels attenuated, Tortuous attenuated, Tortuous   Periphery Attached, mild RPE changes, No heme Attached, no heme, mild RPE changes           Refraction     Wearing Rx       Sphere Cylinder Axis Add   Right -3.00 +3.00 005 +2.75   Left -1.50 +3.00 167 +2.75           IMAGING AND PROCEDURES  Imaging and Procedures for @TODAY @  OCT, Retina - OU - Both Eyes       Right Eye Quality was poor. Central Foveal Thickness: 404. Progression has been stable. Findings include no SRF, abnormal foveal contour, retinal drusen , subretinal hyper-reflective material, intraretinal hyper-reflective material, epiretinal membrane, intraretinal fluid, pigment epithelial detachment, vitreous traction, outer retinal atrophy (Persistent central VMT with cystic changes -- stable; peripapillary CNV with stable improvement in IRF/SRHM).   Left Eye Quality was borderline. Central Foveal Thickness: 254. Progression has improved. Findings include no SRF,  abnormal foveal contour, retinal drusen , subretinal hyper-reflective material, intraretinal hyper-reflective material, intraretinal fluid, vitreous traction (Interval improvement in central SRHM (vitelliform lesion) and focal SRF SN fovea and macula, +VMT; peripapillary SRHM with stable improvement in cystic changes overlying).   Notes *Images captured and stored on drive  Diagnosis / Impression:  OD: exudative ARMD -- Persistent central VMT with cystic changes -- stable; peripapillary CNV with stable improvement in IRF/SRHM OS: exudative ARMD -- Interval improvement in central SRHM (vitelliform lesion) and  focal SRF SN fovea and macula, +VMT; peripapillary SRHM with stable improvement in cystic changes overlying  Clinical management: See below  Abbreviations: NFP - Normal foveal profile. CME - cystoid macular edema. PED - pigment epithelial detachment. IRF - intraretinal fluid. SRF - subretinal fluid. EZ - ellipsoid zone. ERM - epiretinal membrane. ORA - outer retinal atrophy. ORT - outer retinal tubulation. SRHM - subretinal hyper-reflective material      Intravitreal Injection, Pharmacologic Agent - OS - Left Eye       Time Out 11/10/2022. 9:09 AM. Confirmed correct patient, procedure, site, and patient consented.   Anesthesia Topical anesthesia was used. Anesthetic medications included Lidocaine 2%, Proparacaine 0.5%.   Procedure Preparation included 5% betadine to ocular surface, eyelid speculum. A (32g) needle was used.   Injection: 2 mg aflibercept 2 MG/0.05ML   Route: Intravitreal, Site: Left Eye   NDC: L6038910, Lot: [id number], Expiration date: 01/21/2024, Waste: 0 mL   Post-op Post injection exam found visual acuity of at least counting fingers. The patient  tolerated the procedure well. There were no complications. The patient received written and verbal post procedure care education. Post injection medications were not given.            ASSESSMENT/PLAN:    ICD-10-CM   1. 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 Vitreomacular adhesion of both eyes  H43.823     3. Essential hypertension  I10     4. Hypertensive retinopathy of both eyes  H35.033     5. Pseudophakia of both eyes  Z96.1     6. Dry eyes  H04.123      1. Exudative age-related macular degeneration, both eyes  - delayed to follow up from 7 weeks to 9 weeks (01.25.24 to 03.28.24) due to transportation  - pt delayed to follow up from 12 weeks to 6 months due to emergency cholecystomy right before his last appt (June 2023 to Dec 2023)  - OD incidental discovery of peripapillary CNV w/ heme and SRF during emergency visit for corneal abrasion OS  - OS Interval conversion of Exudative ARMD noted on 09.22.22 visit (peripapillary CNV w/ SRF)             - S/P IVA OD #1 (03.22.21), #2 (04.19.21), #3 (05.26.21), #4 (06.23.21) -- IVA resistance  - s/p IVA OS #1 (11.22.22)  - S/P IVE OD #1 sample (07.22.21), #2 (08.24.21), #3 (10.13.21), #4 (11.18.21), #5 (12.30.21), #6 (02.24.22), #7 (04.21.22), #8 (06.30.22), #9 (09.22.22), #10 (12.20.22), #11 (01.31.23), #12 (03.29.23), #13 (06.08.23), #14 (12.14.23), #15 (01.25.24), #16 (03.15.24), #17 (06.06.24), #18 (08.22.24) - s/p IVE OS #1 (09.22.22), #2 (10.21.22), #3 (12.20.22), #4 (01.31.23), #5 (03.29.23), #6 (06.08.23), #7 (12.14.23), #8 (08.22.24)  - BCVA OD 20/150 - decreased; OS 20/50 - decreased  - OCT shows OD: Persistent central VMT with cystic changes -- stable; peripapillary CNV with stable improvement in IRF/SRHM; OS: Interval improvement in central SRHM (vitelliform lesion) and  focal SRF SN fovea and macula, +VMT; peripapillary SRHM with stable improvement in cystic changes overlying at 4 weeks - recommend IVE OS #9 today, 09.19.24 with  follow up 4 weeks agin **OD will be treated q12 weeks** - pt wishes to proceed with injections  - RBA of procedure discussed, questions answered  - IVE informed consent obtained, and re-signed, 12.14.23 OU  - see procedure note  - Eylea4U benefits approved for 2024  - f/u in 4 weeks -- DFE, OCT, possible injxns  2. VMT OU  - mild -- OCT stable OU  - review of OCTs since initial visit shows progressive traction -- hopefully will release uneventfully soon  - monitor   3,4. Hypertensive retinopathy OU  - discussed importance of tight BP control  - monitor   5. Pseudophakia OU  - s/p CE/IOL OU  - IOLs in good position, doing well  - monitor   6. Dry eyes OU - recommend artificial tears and lubricating ointment as needed   Ophthalmic Meds Ordered this visit:  Meds ordered this encounter  Medications   aflibercept (EYLEA) SOLN 2 mg     Return in about 6 weeks (around 12/22/2022) for f/u exu ARMD OU, DFE, OCT.  There are no Patient Instructions on file for this visit.  Explained the diagnoses, plan, and follow up with the patient and they expressed  understanding.  Patient expressed understanding of the importance of proper follow up care.   This document serves as a record of services personally performed by Karie Chimera, MD, PhD. It was created on their behalf by Glee Arvin. Manson Passey, OA an ophthalmic technician. The creation of this record is the provider's dictation and/or activities during the visit.    Electronically signed by: Glee Arvin. Manson Passey, OA 11/10/22 4:27 PM  Karie Chimera, M.D., Ph.D. Diseases & Surgery of the Retina and Vitreous [facility]  I have reviewed the above documentation for accuracy and completeness, and I agree with the above. Karie Chimera, M.D., Ph.D. 11/10/22 4:28 PM   Abbreviations: M myopia (nearsighted); A astigmatism; H hyperopia (farsighted); P presbyopia; Mrx spectacle prescription;  CTL contact lenses; OD right  eye; OS left eye; OU both eyes  XT exotropia; ET esotropia; PEK punctate epithelial keratitis; PEE punctate epithelial erosions; DES dry eye syndrome; MGD meibomian gland dysfunction; ATs artificial tears; PFAT's preservative free artificial tears; NSC nuclear sclerotic cataract; PSC posterior subcapsular cataract; ERM epi-retinal membrane; PVD posterior vitreous detachment; RD retinal detachment; DM diabetes mellitus; DR diabetic retinopathy; NPDR non-proliferative diabetic retinopathy; PDR proliferative diabetic retinopathy; CSME clinically significant macular edema; DME diabetic macular edema; dbh dot blot hemorrhages; CWS cotton wool spot; POAG primary open angle glaucoma; C/D cup-to-disc ratio; HVF humphrey visual field; GVF goldmann visual field; OCT optical coherence tomography; IOP intraocular pressure; BRVO Branch retinal vein occlusion; CRVO central retinal vein occlusion; CRAO central retinal artery occlusion; BRAO branch retinal artery occlusion; RT retinal tear; SB scleral buckle; PPV pars plana vitrectomy; VH Vitreous hemorrhage; PRP panretinal laser photocoagulation; IVK intravitreal kenalog; VMT vitreomacular traction; MH Macular hole;  NVD neovascularization of the disc; NVE neovascularization elsewhere; AREDS age related eye disease study; ARMD age related macular degeneration; POAG primary open angle glaucoma; EBMD epithelial/anterior basement membrane dystrophy; ACIOL anterior chamber intraocular lens; IOL intraocular lens; PCIOL posterior chamber intraocular lens; Phaco/IOL phacoemulsification with intraocular lens placement; PRK photorefractive keratectomy; LASIK laser assisted in situ keratomileusis; HTN hypertension; DM diabetes mellitus; COPD chronic obstructive pulmonary disease

## 2022-11-09 DIAGNOSIS — J449 Chronic obstructive pulmonary disease, unspecified: Secondary | ICD-10-CM | POA: Diagnosis not present

## 2022-11-10 ENCOUNTER — Ambulatory Visit (INDEPENDENT_AMBULATORY_CARE_PROVIDER_SITE_OTHER): Payer: Medicare Other | Admitting: Ophthalmology

## 2022-11-10 ENCOUNTER — Telehealth: Payer: Self-pay

## 2022-11-10 ENCOUNTER — Encounter (INDEPENDENT_AMBULATORY_CARE_PROVIDER_SITE_OTHER): Payer: Self-pay | Admitting: Ophthalmology

## 2022-11-10 DIAGNOSIS — H353231 Exudative age-related macular degeneration, bilateral, with active choroidal neovascularization: Secondary | ICD-10-CM

## 2022-11-10 DIAGNOSIS — Z961 Presence of intraocular lens: Secondary | ICD-10-CM | POA: Diagnosis not present

## 2022-11-10 DIAGNOSIS — H43823 Vitreomacular adhesion, bilateral: Secondary | ICD-10-CM

## 2022-11-10 DIAGNOSIS — H35033 Hypertensive retinopathy, bilateral: Secondary | ICD-10-CM | POA: Diagnosis not present

## 2022-11-10 DIAGNOSIS — J90 Pleural effusion, not elsewhere classified: Secondary | ICD-10-CM

## 2022-11-10 DIAGNOSIS — H04123 Dry eye syndrome of bilateral lacrimal glands: Secondary | ICD-10-CM | POA: Diagnosis not present

## 2022-11-10 DIAGNOSIS — I10 Essential (primary) hypertension: Secondary | ICD-10-CM

## 2022-11-10 MED ORDER — AFLIBERCEPT 2MG/0.05ML IZ SOLN FOR KALEIDOSCOPE
2.0000 mg | INTRAVITREAL | Status: AC | PRN
Start: 2022-11-10 — End: 2022-11-10
  Administered 2022-11-10: 2 mg via INTRAVITREAL

## 2022-11-10 NOTE — Telephone Encounter (Signed)
Chest Xray showed moderate pleural effusion. Dr. Tomie China recommends a pleural tap.

## 2022-11-10 NOTE — Telephone Encounter (Signed)
Spoke with Eddie Kelly per DPR and advised of results and need for a tap. [name] verbalized understanding and had no additional questions.

## 2022-11-11 DIAGNOSIS — I4821 Permanent atrial fibrillation: Secondary | ICD-10-CM | POA: Diagnosis not present

## 2022-11-11 DIAGNOSIS — R002 Palpitations: Secondary | ICD-10-CM | POA: Diagnosis not present

## 2022-11-14 ENCOUNTER — Telehealth: Payer: Self-pay | Admitting: Cardiology

## 2022-11-14 DIAGNOSIS — G629 Polyneuropathy, unspecified: Secondary | ICD-10-CM | POA: Diagnosis not present

## 2022-11-14 DIAGNOSIS — R2243 Localized swelling, mass and lump, lower limb, bilateral: Secondary | ICD-10-CM | POA: Diagnosis not present

## 2022-11-14 DIAGNOSIS — E785 Hyperlipidemia, unspecified: Secondary | ICD-10-CM | POA: Diagnosis not present

## 2022-11-14 DIAGNOSIS — Z8679 Personal history of other diseases of the circulatory system: Secondary | ICD-10-CM | POA: Diagnosis not present

## 2022-11-14 DIAGNOSIS — M179 Osteoarthritis of knee, unspecified: Secondary | ICD-10-CM | POA: Diagnosis not present

## 2022-11-14 DIAGNOSIS — K5909 Other constipation: Secondary | ICD-10-CM | POA: Diagnosis not present

## 2022-11-14 DIAGNOSIS — I10 Essential (primary) hypertension: Secondary | ICD-10-CM | POA: Diagnosis not present

## 2022-11-14 DIAGNOSIS — J309 Allergic rhinitis, unspecified: Secondary | ICD-10-CM | POA: Diagnosis not present

## 2022-11-14 DIAGNOSIS — N289 Disorder of kidney and ureter, unspecified: Secondary | ICD-10-CM | POA: Diagnosis not present

## 2022-11-14 DIAGNOSIS — J90 Pleural effusion, not elsewhere classified: Secondary | ICD-10-CM | POA: Diagnosis not present

## 2022-11-14 NOTE — Telephone Encounter (Signed)
Spoke with [name] about procedure to be scheduled at [facility]. Order has been faxed to the radiology department at [facility]- they will contact patient to schedule. Waynetta Sandy has been made aware of this.

## 2022-11-14 NOTE — Telephone Encounter (Signed)
Patient's daughter states someone was supposed to contact her to schedule to have fluid removed from patient's lungs but she hasn't heard from anyone. She would like an update.

## 2022-11-16 DIAGNOSIS — Z48813 Encounter for surgical aftercare following surgery on the respiratory system: Secondary | ICD-10-CM | POA: Diagnosis not present

## 2022-11-16 DIAGNOSIS — J90 Pleural effusion, not elsewhere classified: Secondary | ICD-10-CM | POA: Diagnosis not present

## 2022-11-28 ENCOUNTER — Telehealth: Payer: Self-pay

## 2022-11-28 DIAGNOSIS — J90 Pleural effusion, not elsewhere classified: Secondary | ICD-10-CM

## 2022-11-28 NOTE — Telephone Encounter (Signed)
[name] reports that pt is doing well since thoracentesis.  [name] was asking what has caused the pleural effusion?  What are the results on the Zio?

## 2022-11-29 DIAGNOSIS — R339 Retention of urine, unspecified: Secondary | ICD-10-CM | POA: Diagnosis not present

## 2022-12-08 ENCOUNTER — Encounter: Payer: Self-pay | Admitting: Cardiology

## 2022-12-14 DIAGNOSIS — G3184 Mild cognitive impairment, so stated: Secondary | ICD-10-CM | POA: Diagnosis not present

## 2022-12-14 DIAGNOSIS — J309 Allergic rhinitis, unspecified: Secondary | ICD-10-CM | POA: Diagnosis not present

## 2022-12-14 DIAGNOSIS — R634 Abnormal weight loss: Secondary | ICD-10-CM | POA: Diagnosis not present

## 2022-12-14 DIAGNOSIS — J90 Pleural effusion, not elsewhere classified: Secondary | ICD-10-CM | POA: Diagnosis not present

## 2022-12-14 DIAGNOSIS — J9811 Atelectasis: Secondary | ICD-10-CM | POA: Diagnosis not present

## 2022-12-14 DIAGNOSIS — R0602 Shortness of breath: Secondary | ICD-10-CM | POA: Diagnosis not present

## 2022-12-14 DIAGNOSIS — Z8679 Personal history of other diseases of the circulatory system: Secondary | ICD-10-CM | POA: Diagnosis not present

## 2022-12-14 NOTE — Progress Notes (Signed)
[facility] - Clinic Note  12/22/2022     CHIEF COMPLAINT Patient presents for Retina Follow Up   HISTORY OF PRESENT ILLNESS: Eddie Kelly is a 89 y.o. male who presents to the clinic today for:   HPI     Retina Follow Up   Patient presents with  Wet AMD.  In both eyes.  This started 6 weeks ago.  Duration of 6 weeks.  Since onset it is stable.  I, the attending physician,  performed the HPI with the patient and updated documentation appropriately.        Comments   Patient feels the vision is the same. He is having fluid taken of his lungs. He is using At's OU PRN.      Last edited by [name], MD on 12/22/2022 12:19 PM.    Pts daughter in law states he has had fluid around his lungs that he's had drained twice   Referring physician: [name], MD [address] [place],  [place] [address]  HISTORICAL INFORMATION:   Selected notes from the MEDICAL RECORD NUMBER Referred by Dr. Zetta Bills for concern of CNVM OD LEE: 05/04/2019 BCVA: 20/70-1            20/50+1   CURRENT MEDICATIONS: No current outpatient medications on file. (Ophthalmic Drugs)   No current facility-administered medications for this visit. (Ophthalmic Drugs)   Current Outpatient Medications (Other)  Medication Sig   aspirin EC 81 MG tablet Take 81 mg by mouth daily.   bethanechol (URECHOLINE) 25 MG tablet Take 25 mg by mouth 2 (two) times daily.   buPROPion (WELLBUTRIN XL) 150 MG 24 hr tablet Take 300 mg by mouth daily.   cyanocobalamin (,VITAMIN B-12,) 1000 MCG/ML injection Inject 1,000 mcg into the muscle every 30 (thirty) days.   diclofenac Sodium (VOLTAREN) 1 % GEL Apply 1 application topically 3 (three) times daily as needed for pain.   ferrous sulfate 325 (65 FE) MG EC tablet Take 325 mg by mouth 3 (three) times daily with meals.   finasteride (PROSCAR) 5 MG tablet Take 5 mg by mouth daily.   GLUCOSAMINE-CHONDROITIN PO Take 2 tablets by mouth daily.    levothyroxine (SYNTHROID) 100 MCG tablet Take 100 mcg by mouth daily.   loratadine (CLARITIN) 10 MG tablet Take 10 mg by mouth daily.   memantine (NAMENDA) 10 MG tablet Take 10 mg by mouth daily.   montelukast (SINGULAIR) 10 MG tablet Take 10 mg by mouth at bedtime.   Multiple Vitamin (MULTIVITAMIN WITH MINERALS) TABS tablet Take 1 tablet by mouth daily.   Multiple Vitamins-Minerals (CENTRUM SILVER PO) Take 1 tablet by mouth daily.   nitroGLYCERIN (NITROSTAT) 0.4 MG SL tablet Place 0.4 mg under the tongue every 5 (five) minutes as needed for chest pain.   Probiotic Product (PROBIOTIC DAILY PO) Take 2 capsules by mouth daily.   saw palmetto 80 MG capsule Take 80 mg by mouth daily.   tamsulosin (FLOMAX) 0.4 MG CAPS capsule Take 1 capsule by mouth daily.   torsemide (DEMADEX) 20 MG tablet Take 10 mg by mouth daily.   No current facility-administered medications for this visit. (Other)   REVIEW OF SYSTEMS: ROS   Positive for: Eyes, Respiratory Last edited by [name], MD on 12/22/2022 12:18 PM.     ALLERGIES Allergies  Allergen Reactions   Ciprofloxacin Other (See Comments)    Edema, generalized   Levofloxacin    PAST MEDICAL HISTORY Past Medical  History:  Diagnosis Date   A-fib [facility])    Anxiety and depression    Aspiration pneumonia (HCC) 06/27/2019   CAD (coronary artery disease)    Chronic atrial fibrillation (HCC) 09/06/2019   Epistaxis 06/27/2019   Essential hypertension 04/12/2018   Hypertension 09/11/2019   Hypertensive retinopathy    OU   Hypothyroidism    Left inguinal hernia 08/10/2017   Macular degeneration    Wet OD; Dry OS   Permanent atrial fibrillation (HCC) 01/14/2019   Postoperative examination 11/29/2017   Preoperative cardiovascular examination 09/26/2017   Presence of Watchman left atrial appendage closure device 02/04/2020   PVC (premature ventricular contraction)    Sinus bradycardia    Vitamin D deficiency    Past Surgical History:  Procedure  Laterality Date   CATARACT EXTRACTION Bilateral    CHOLECYSTECTOMY     [facility], Onyx Radium   EYE SURGERY Bilateral    Cat Sx   HERNIA REPAIR     FAMILY HISTORY Family History  Problem Relation Age of Onset   Cancer Father    Heart attack Father    Colon cancer Father    Heart disease Brother    Diabetes Brother    Rectal cancer Neg Hx    Stomach cancer Neg Hx    SOCIAL HISTORY Social History   Tobacco Use   Smoking status: Former    Current packs/day: 0.00    Types: Cigarettes    Quit date: 1983    Years since quitting: 41.8   Smokeless tobacco: Never  Vaping Use   Vaping status: Never Used  Substance Use Topics   Alcohol use: Not Currently    Alcohol/week: 1.0 standard drink of alcohol    Types: 1 Cans of beer per week    Comment: "every once in a blue moon"   Drug use: No       OPHTHALMIC EXAM: Base Eye Exam     Visual Acuity (Snellen - Linear)       Right Left   Dist cc 20/150 20/70   Dist ph cc NI 20/60    Correction: Glasses         Tonometry (Tonopen, 8:34 AM)       Right Left   Pressure 15 13         Pupils       Dark Light Shape React APD   Right 4 2 Round Brisk None   Left 4 2 Round Brisk None         Visual Fields       Left Right    Full Full         Extraocular Movement       Right Left    Full, Ortho Full, Ortho         Neuro/Psych     Oriented x3: Yes   Mood/Affect: Normal         Dilation     Both eyes: 1.0% Mydriacyl, 2.5% Phenylephrine @ 8:31 AM           Slit Lamp and Fundus Exam     Slit Lamp Exam       Right Left   Lids/Lashes Dermatochalasis - upper lid, +hooding Dermatochalasis - upper lid, Meibomian gland dysfunction   Conjunctiva/Sclera White and quiet White and quiet   Cornea 2-3+ Punctate epithelial erosions, well healed cataract wound, irregular epi, decreased TBUT 2-3+ Punctate epithelial erosions, well healed cataract wound, irregular epi, decreased TBUT   Anterior  Chamber deep and clear deep and clear   Iris Round and moderately dilated Round and poorly dilated to 4.16mm   Lens PC IOL in good position with open PC PC IOL in good position with open PC   Anterior Vitreous Vitreous syneresis Vitreous syneresis         Fundus Exam       Right Left   Disc mild Pallor, Sharp rim, peripapillary CNV with mild cystic changes ST disc -- stably improved, no heme mild Pallor, Sharp rim; peripapillary CNV with mild cystic changes overlying - stably improved, no heme   C/D Ratio 0.4 0.4   Macula Flat, Blunted foveal reflex, drusen, central VMT with cystic changes -- persistent Flat, Blunted foveal reflex, fine Drusen, mild Retinal pigment epithelial mottling, no heme, focal edema nasal mac -- improved, +VMT   Vessels attenuated, Tortuous attenuated, Tortuous   Periphery Attached, mild RPE changes, No heme Attached, no heme, mild RPE changes           Refraction     Wearing Rx       Sphere Cylinder Axis Add   Right -3.00 +3.00 005 +2.75   Left -1.50 +3.00 167 +2.75           IMAGING AND PROCEDURES  Imaging and Procedures for @TODAY @  OCT, Retina - OU - Both Eyes       Right Eye Quality was poor. Progression has been stable. Findings include no SRF, abnormal foveal contour, retinal drusen , subretinal hyper-reflective material, intraretinal hyper-reflective material, epiretinal membrane, intraretinal fluid, pigment epithelial detachment, vitreous traction, outer retinal atrophy (Persistent central VMT with cystic changes -- stable; peripapillary CNV with stable improvement in IRF/SRHM).   Left Eye Quality was borderline. Central Foveal Thickness: 266. Progression has been stable. Findings include no SRF, abnormal foveal contour, retinal drusen , subretinal hyper-reflective material, intraretinal hyper-reflective material, intraretinal fluid, vitreous traction (stable improvement in central SRHM (vitelliform lesion) and focal SRF SN fovea and  macula, +VMT; peripapillary SRHM with stable improvement in cystic changes overlying).   Notes *Images captured and stored on drive  Diagnosis / Impression:  OD: exudative ARMD -- Persistent central VMT with cystic changes -- stable; peripapillary CNV with stable improvement in IRF/SRHM OS: exudative ARMD -- stable improvement in central [facility] (vitelliform lesion) and focal SRF SN fovea and macula, +VMT; peripapillary SRHM with stable improvement in cystic changes overlying  Clinical management: See below  Abbreviations: NFP - Normal foveal profile. CME - cystoid macular edema. PED - pigment epithelial detachment. IRF - intraretinal fluid. SRF - subretinal fluid. EZ - ellipsoid zone. ERM - epiretinal membrane. ORA - outer retinal atrophy. ORT - outer retinal tubulation. SRHM - subretinal hyper-reflective material      Intravitreal Injection, Pharmacologic Agent - OS - Left Eye       Time Out 12/22/2022. 9:38 AM. Confirmed correct patient, procedure, site, and patient consented.   Anesthesia Topical anesthesia was used. Anesthetic medications included Lidocaine 2%, Proparacaine 0.5%.   Procedure Preparation included 5% betadine to ocular surface, eyelid speculum. A (32g) needle was used.   Injection: 2 mg aflibercept 2 MG/0.05ML   Route: Intravitreal, Site: Left Eye   NDC: L6038910, Lot: [id number], Expiration date: 01/21/2024, Waste: 0 mL   Post-op Post injection exam found visual acuity of at least counting fingers. The patient tolerated the procedure well. There were no complications. The patient received written and verbal post procedure care education. Post injection medications were not given.  ASSESSMENT/PLAN:   ICD-10-CM   1. Exudative age-related macular degeneration of both eyes with active choroidal neovascularization (HCC)  H35.3231 OCT, Retina - OU - Both Eyes    Intravitreal Injection, Pharmacologic Agent - OS - Left Eye    aflibercept (EYLEA)  SOLN 2 mg    2. Vitreomacular adhesion of both eyes  H43.823     3. Essential hypertension  I10     4. Hypertensive retinopathy of both eyes  H35.033     5. Pseudophakia of both eyes  Z96.1     6. Dry eyes  H04.123      1. Exudative age-related macular degeneration, both eyes  - delayed to follow up from 7 weeks to 9 weeks (01.25.24 to 03.28.24) due to transportation  - pt delayed to follow up from 12 weeks to 6 months due to emergency cholecystomy right before his last appt (June 2023 to Dec 2023)  - OD incidental discovery of peripapillary CNV w/ heme and SRF during emergency visit for corneal abrasion OS  - OS Interval conversion of Exudative ARMD noted on 09.22.22 visit (peripapillary CNV w/ SRF)             - S/P IVA OD #1 (03.22.21), #2 (04.19.21), #3 (05.26.21), #4 (06.23.21) -- IVA resistance  - s/p IVA OS #1 (11.22.22)  - S/P IVE OD #1 sample (07.22.21), #2 (08.24.21), #3 (10.13.21), #4 (11.18.21), #5 (12.30.21), #6 (02.24.22), #7 (04.21.22), #8 (06.30.22), #9 (09.22.22), #10 (12.20.22), #11 (01.31.23), #12 (03.29.23), #13 (06.08.23), #14 (12.14.23), #15 (01.25.24), #16 (03.15.24), #17 (06.06.24), #18 (08.22.24) - s/p IVE OS #1 (09.22.22), #2 (10.21.22), #3 (12.20.22), #4 (01.31.23), #5 (03.29.23), #6 (06.08.23), #7 (12.14.23), #8 (08.22.24), #9 (09.19.24)  - BCVA OD 20/150 - stable; OS 20/60 - decreased from 20/50  - OCT shows OD: Persistent central VMT with cystic changes -- stable; peripapillary CNV with stable improvement in IRF/SRHM; OS: Interval improvement in central SRHM (vitelliform lesion) and  focal SRF SN fovea and macula, +VMT; peripapillary SRHM with stable improvement in cystic changes overlying at 6 weeks - recommend IVE OS #10 today, 10.31.24 with follow up extended to 6-8 weeks, will hold tx in OD again **OD will be treated q3-4 mos or prn** - pt in agreement and wishes to proceed with injection OS  - RBA of procedure discussed, questions answered  - IVE  informed consent obtained, and re-signed, 12.14.23 OU  - see procedure note  - Eylea[employer]U benefits approved for 2024  - f/u in 6-8 weeks -- DFE, OCT, possible injxns  2. VMT OU  - mild -- OCT stable OU  - review of OCTs since initial visit shows progressive traction -- hopefully will release uneventfully soon  - monitor   3,4. Hypertensive retinopathy OU  - discussed importance of tight BP control  - monitor   5. Pseudophakia OU  - s/p CE/IOL OU  - IOLs in good position, doing well  - monitor   6. Dry eyes OU - recommend artificial tears and lubricating ointment as needed   Ophthalmic Meds Ordered this visit:  Meds ordered this encounter  Medications   aflibercept (EYLEA) SOLN 2 mg     Return for f/u 6-8 weeks, exu ARMD OU, DFE, OCT.  There are no Patient Instructions on file for this visit.  Explained the diagnoses, plan, and follow up with the patient and they expressed understanding.  Patient expressed understanding of the importance of proper follow up care.   This document serves as a record of  services personally performed by Karie Chimera, MD, PhD. It was created on their behalf by Glee Arvin. Manson Passey, OA an ophthalmic technician. The creation of this record is the provider's dictation and/or activities during the visit.    Electronically signed by: Glee Arvin. Manson Passey, OA 12/22/22 12:24 PM  Karie Chimera, M.D., Ph.D. Diseases & Surgery of the Retina and Vitreous [facility]  I have reviewed the above documentation for accuracy and completeness, and I agree with the above. Karie Chimera, M.D., Ph.D. 12/22/22 12:24 PM  Abbreviations: M myopia (nearsighted); A astigmatism; H hyperopia (farsighted); P presbyopia; Mrx spectacle prescription;  CTL contact lenses; OD right eye; OS left eye; OU both eyes  XT exotropia; ET esotropia; PEK punctate epithelial keratitis; PEE punctate epithelial erosions; DES dry eye syndrome; MGD meibomian gland dysfunction;  ATs artificial tears; PFAT's preservative free artificial tears; NSC nuclear sclerotic cataract; PSC posterior subcapsular cataract; ERM epi-retinal membrane; PVD posterior vitreous detachment; RD retinal detachment; DM diabetes mellitus; DR diabetic retinopathy; NPDR non-proliferative diabetic retinopathy; PDR proliferative diabetic retinopathy; CSME clinically significant macular edema; DME diabetic macular edema; dbh dot blot hemorrhages; CWS cotton wool spot; POAG primary open angle glaucoma; C/D cup-to-disc ratio; HVF humphrey visual field; GVF goldmann visual field; OCT optical coherence tomography; IOP intraocular pressure; BRVO Branch retinal vein occlusion; CRVO central retinal vein occlusion; CRAO central retinal artery occlusion; BRAO branch retinal artery occlusion; RT retinal tear; SB scleral buckle; PPV pars plana vitrectomy; VH Vitreous hemorrhage; PRP panretinal laser photocoagulation; IVK intravitreal kenalog; VMT vitreomacular traction; MH Macular hole;  NVD neovascularization of the disc; NVE neovascularization elsewhere; AREDS age related eye disease study; ARMD age related macular degeneration; POAG primary open angle glaucoma; EBMD epithelial/anterior basement membrane dystrophy; ACIOL anterior chamber intraocular lens; IOL intraocular lens; PCIOL posterior chamber intraocular lens; Phaco/IOL phacoemulsification with intraocular lens placement; PRK photorefractive keratectomy; LASIK laser assisted in situ keratomileusis; HTN hypertension; DM diabetes mellitus; COPD chronic obstructive pulmonary disease

## 2022-12-14 NOTE — Telephone Encounter (Signed)
Wife called again to follow up on Zio heart monitor test results.  Wife also wants to get further information regarding patient's pleural effusion.

## 2022-12-16 ENCOUNTER — Telehealth: Payer: Self-pay | Admitting: Cardiology

## 2022-12-16 NOTE — Telephone Encounter (Signed)
Called pts wife and she stated that Dr. Tomie China called her this afternoon and answered her questions.

## 2022-12-16 NOTE — Telephone Encounter (Signed)
Wife is calling in about the fluid build up, its from a leaky heart valve. Please advise

## 2022-12-21 NOTE — Addendum Note (Signed)
Addended by: Eleonore Chiquito on: 12/21/2022 12:39 PM   Modules accepted: Orders

## 2022-12-22 ENCOUNTER — Encounter (INDEPENDENT_AMBULATORY_CARE_PROVIDER_SITE_OTHER): Payer: Self-pay | Admitting: Ophthalmology

## 2022-12-22 ENCOUNTER — Ambulatory Visit (INDEPENDENT_AMBULATORY_CARE_PROVIDER_SITE_OTHER): Payer: Medicare Other | Admitting: Ophthalmology

## 2022-12-22 DIAGNOSIS — I10 Essential (primary) hypertension: Secondary | ICD-10-CM

## 2022-12-22 DIAGNOSIS — H353231 Exudative age-related macular degeneration, bilateral, with active choroidal neovascularization: Secondary | ICD-10-CM | POA: Diagnosis not present

## 2022-12-22 DIAGNOSIS — Z961 Presence of intraocular lens: Secondary | ICD-10-CM

## 2022-12-22 DIAGNOSIS — H43823 Vitreomacular adhesion, bilateral: Secondary | ICD-10-CM

## 2022-12-22 DIAGNOSIS — H04123 Dry eye syndrome of bilateral lacrimal glands: Secondary | ICD-10-CM | POA: Diagnosis not present

## 2022-12-22 DIAGNOSIS — H35033 Hypertensive retinopathy, bilateral: Secondary | ICD-10-CM

## 2022-12-22 MED ORDER — AFLIBERCEPT 2MG/0.05ML IZ SOLN FOR KALEIDOSCOPE
2.0000 mg | INTRAVITREAL | Status: AC | PRN
Start: 2022-12-22 — End: 2022-12-22
  Administered 2022-12-22: 2 mg via INTRAVITREAL

## 2022-12-27 ENCOUNTER — Telehealth: Payer: Self-pay | Admitting: Cardiology

## 2022-12-27 NOTE — Telephone Encounter (Signed)
Spoke with [name] who states that the pt is doing ok and he is not having any more problems than normal. Please advise

## 2022-12-27 NOTE — Telephone Encounter (Signed)
Office calling to make provider aware that they do not have a Radiologist in office this week or next. They would like a c/b if pt is able to be scheduled once Radiologist is back in office. If test needs to be done sooner order may need to be sent elsewhere. Please advise

## 2022-12-28 NOTE — Telephone Encounter (Signed)
Misty Stanley aware ok to schedule as soon as possible.

## 2022-12-30 ENCOUNTER — Encounter: Payer: Self-pay | Admitting: Cardiology

## 2023-01-24 DIAGNOSIS — J90 Pleural effusion, not elsewhere classified: Secondary | ICD-10-CM | POA: Diagnosis not present

## 2023-01-29 DIAGNOSIS — R531 Weakness: Secondary | ICD-10-CM | POA: Diagnosis not present

## 2023-01-29 DIAGNOSIS — I509 Heart failure, unspecified: Secondary | ICD-10-CM | POA: Diagnosis not present

## 2023-01-29 DIAGNOSIS — I2510 Atherosclerotic heart disease of native coronary artery without angina pectoris: Secondary | ICD-10-CM | POA: Diagnosis not present

## 2023-01-29 DIAGNOSIS — I4891 Unspecified atrial fibrillation: Secondary | ICD-10-CM | POA: Diagnosis not present

## 2023-01-29 DIAGNOSIS — I361 Nonrheumatic tricuspid (valve) insufficiency: Secondary | ICD-10-CM | POA: Diagnosis not present

## 2023-01-29 DIAGNOSIS — R1084 Generalized abdominal pain: Secondary | ICD-10-CM | POA: Diagnosis not present

## 2023-01-29 DIAGNOSIS — R0602 Shortness of breath: Secondary | ICD-10-CM | POA: Diagnosis not present

## 2023-01-29 DIAGNOSIS — R112 Nausea with vomiting, unspecified: Secondary | ICD-10-CM | POA: Diagnosis not present

## 2023-01-29 DIAGNOSIS — D638 Anemia in other chronic diseases classified elsewhere: Secondary | ICD-10-CM | POA: Diagnosis not present

## 2023-01-29 DIAGNOSIS — I340 Nonrheumatic mitral (valve) insufficiency: Secondary | ICD-10-CM | POA: Diagnosis not present

## 2023-01-29 DIAGNOSIS — E039 Hypothyroidism, unspecified: Secondary | ICD-10-CM | POA: Diagnosis not present

## 2023-01-29 DIAGNOSIS — J90 Pleural effusion, not elsewhere classified: Secondary | ICD-10-CM | POA: Diagnosis not present

## 2023-01-29 DIAGNOSIS — J9602 Acute respiratory failure with hypercapnia: Secondary | ICD-10-CM | POA: Diagnosis not present

## 2023-01-29 DIAGNOSIS — Z95818 Presence of other cardiac implants and grafts: Secondary | ICD-10-CM | POA: Diagnosis not present

## 2023-01-29 DIAGNOSIS — J441 Chronic obstructive pulmonary disease with (acute) exacerbation: Secondary | ICD-10-CM | POA: Diagnosis not present

## 2023-01-29 DIAGNOSIS — J690 Pneumonitis due to inhalation of food and vomit: Secondary | ICD-10-CM | POA: Diagnosis not present

## 2023-01-29 DIAGNOSIS — I110 Hypertensive heart disease with heart failure: Secondary | ICD-10-CM | POA: Diagnosis not present

## 2023-01-29 DIAGNOSIS — R109 Unspecified abdominal pain: Secondary | ICD-10-CM | POA: Diagnosis not present

## 2023-01-29 DIAGNOSIS — J918 Pleural effusion in other conditions classified elsewhere: Secondary | ICD-10-CM | POA: Diagnosis not present

## 2023-01-29 DIAGNOSIS — I482 Chronic atrial fibrillation, unspecified: Secondary | ICD-10-CM | POA: Diagnosis not present

## 2023-01-29 DIAGNOSIS — N390 Urinary tract infection, site not specified: Secondary | ICD-10-CM | POA: Diagnosis not present

## 2023-01-29 DIAGNOSIS — M1990 Unspecified osteoarthritis, unspecified site: Secondary | ICD-10-CM | POA: Diagnosis not present

## 2023-01-29 DIAGNOSIS — J449 Chronic obstructive pulmonary disease, unspecified: Secondary | ICD-10-CM | POA: Diagnosis not present

## 2023-01-29 DIAGNOSIS — J440 Chronic obstructive pulmonary disease with acute lower respiratory infection: Secondary | ICD-10-CM | POA: Diagnosis not present

## 2023-01-29 DIAGNOSIS — Z95828 Presence of other vascular implants and grafts: Secondary | ICD-10-CM | POA: Diagnosis not present

## 2023-01-29 DIAGNOSIS — Z87440 Personal history of urinary (tract) infections: Secondary | ICD-10-CM | POA: Diagnosis not present

## 2023-01-29 DIAGNOSIS — E7800 Pure hypercholesterolemia, unspecified: Secondary | ICD-10-CM | POA: Diagnosis not present

## 2023-01-29 DIAGNOSIS — Z881 Allergy status to other antibiotic agents status: Secondary | ICD-10-CM | POA: Diagnosis not present

## 2023-01-29 DIAGNOSIS — I444 Left anterior fascicular block: Secondary | ICD-10-CM | POA: Diagnosis not present

## 2023-01-29 DIAGNOSIS — Z87891 Personal history of nicotine dependence: Secondary | ICD-10-CM | POA: Diagnosis not present

## 2023-01-29 DIAGNOSIS — J9601 Acute respiratory failure with hypoxia: Secondary | ICD-10-CM | POA: Diagnosis not present

## 2023-01-29 LAB — LAB REPORT - SCANNED: EGFR: 46

## 2023-02-01 DIAGNOSIS — I361 Nonrheumatic tricuspid (valve) insufficiency: Secondary | ICD-10-CM | POA: Diagnosis not present

## 2023-02-01 DIAGNOSIS — I340 Nonrheumatic mitral (valve) insufficiency: Secondary | ICD-10-CM | POA: Diagnosis not present

## 2023-02-01 LAB — LAB REPORT - SCANNED: EGFR: 46

## 2023-02-06 DIAGNOSIS — R531 Weakness: Secondary | ICD-10-CM | POA: Diagnosis not present

## 2023-02-06 DIAGNOSIS — Z87440 Personal history of urinary (tract) infections: Secondary | ICD-10-CM | POA: Diagnosis not present

## 2023-02-06 DIAGNOSIS — J90 Pleural effusion, not elsewhere classified: Secondary | ICD-10-CM | POA: Diagnosis not present

## 2023-02-06 DIAGNOSIS — Z09 Encounter for follow-up examination after completed treatment for conditions other than malignant neoplasm: Secondary | ICD-10-CM | POA: Diagnosis not present

## 2023-02-06 DIAGNOSIS — Z9981 Dependence on supplemental oxygen: Secondary | ICD-10-CM | POA: Diagnosis not present

## 2023-02-06 DIAGNOSIS — R6889 Other general symptoms and signs: Secondary | ICD-10-CM | POA: Diagnosis not present

## 2023-02-06 DIAGNOSIS — Z7409 Other reduced mobility: Secondary | ICD-10-CM | POA: Diagnosis not present

## 2023-02-09 ENCOUNTER — Ambulatory Visit: Payer: Medicare Other | Attending: Cardiology | Admitting: Cardiology

## 2023-02-09 ENCOUNTER — Encounter (INDEPENDENT_AMBULATORY_CARE_PROVIDER_SITE_OTHER): Payer: Medicare Other | Admitting: Ophthalmology

## 2023-02-09 ENCOUNTER — Encounter: Payer: Self-pay | Admitting: Cardiology

## 2023-02-09 ENCOUNTER — Telehealth: Payer: Self-pay

## 2023-02-09 VITALS — BP 144/74 | HR 76 | Ht 74.0 in | Wt 162.0 lb

## 2023-02-09 DIAGNOSIS — J90 Pleural effusion, not elsewhere classified: Secondary | ICD-10-CM | POA: Diagnosis not present

## 2023-02-09 DIAGNOSIS — N289 Disorder of kidney and ureter, unspecified: Secondary | ICD-10-CM

## 2023-02-09 DIAGNOSIS — Z95818 Presence of other cardiac implants and grafts: Secondary | ICD-10-CM | POA: Diagnosis not present

## 2023-02-09 DIAGNOSIS — I10 Essential (primary) hypertension: Secondary | ICD-10-CM

## 2023-02-09 DIAGNOSIS — J9611 Chronic respiratory failure with hypoxia: Secondary | ICD-10-CM | POA: Diagnosis not present

## 2023-02-09 DIAGNOSIS — I4821 Permanent atrial fibrillation: Secondary | ICD-10-CM

## 2023-02-09 DIAGNOSIS — R918 Other nonspecific abnormal finding of lung field: Secondary | ICD-10-CM | POA: Diagnosis not present

## 2023-02-09 DIAGNOSIS — J4520 Mild intermittent asthma, uncomplicated: Secondary | ICD-10-CM | POA: Diagnosis not present

## 2023-02-09 NOTE — Patient Instructions (Signed)
Medication Instructions:  Your physician recommends that you continue on your current medications as directed. Please refer to the Current Medication list given to you today.  *If you need a refill on your cardiac medications before your next appointment, please call your pharmacy*   Lab Work: None ordered If you have labs (blood work) drawn today and your tests are completely normal, you will receive your results only by: MyChart Message (if you have MyChart) OR A paper copy in the mail If you have any lab test that is abnormal or we need to change your treatment, we will call you to review the results.   Testing/Procedures: None ordered   Follow-Up: At [facility], you and your health needs are our priority.  As part of our continuing mission to provide you with exceptional heart care, we have created designated Provider Care Teams.  These Care Teams include your primary Cardiologist (physician) and Advanced Practice Providers (APPs -  Physician Assistants and Nurse Practitioners) who all work together to provide you with the care you need, when you need it.  We recommend signing up for the patient portal called "MyChart".  Sign up information is provided on this After Visit Summary.  MyChart is used to connect with patients for Virtual Visits (Telemedicine).  Patients are able to view lab/test results, encounter notes, upcoming appointments, etc.  Non-urgent messages can be sent to your provider as well.   To learn more about what you can do with MyChart, go to [email or web address].    Your next appointment:   3 month(s)  The format for your next appointment:   In Person  Provider:   Rajan Revankar, MD    Other Instructions none  Important Information About Sugar      

## 2023-02-09 NOTE — Addendum Note (Signed)
Addended by: Eleonore Chiquito on: 02/09/2023 11:52 AM   Modules accepted: Orders

## 2023-02-09 NOTE — Telephone Encounter (Signed)
Results reviewed with [name] per DPR as per Dr. Kem Parkinson note.  [name] verbalized understanding and had no additional questions. Routed to PCP.

## 2023-02-09 NOTE — Progress Notes (Signed)
Cardiology Office Note:    Date:  02/09/2023   ID:  Eddie Kelly, DOB 1925-04-19, MRN [id number]  PCP:  [name], MD  Cardiologist:  Garwin Brothers, MD   Referring MD: [name], MD    ASSESSMENT:    1. Permanent atrial fibrillation (HCC)   2. Essential hypertension   3. Pleural effusion   4. Renal insufficiency   5. Presence of Watchman left atrial appendage closure device    PLAN:    In order of problems listed above:  Permanent atrial fibrillation: Patient is post Watchman device.  Heart rates are under good control.  Last event monitor report was discussed with him. Recurrent pleural effusion: Patient has had 4 pleural taps.  He is right mid and lower base has reduced sounds and I suspect his accumulated fluid again.  I will send him in for an x-ray to [facility].  I also want to send him to our interventional radiology colleagues to have him assessed for pleurodesis or pleural drain or any appropriate therapy for him. Essential hypertension: Blood pressure stable and diet was emphasized. Patient will be seen in follow-up appointment in 3 months or earlier if the patient has any concerns. He will have a Chem-7 today.   Medication Adjustments/Labs and Tests Ordered: Current medicines are reviewed at length with the patient today.  Concerns regarding medicines are outlined above.  No orders of the defined types were placed in this encounter.  No orders of the defined types were placed in this encounter.    Chief Complaint  Patient presents with   Hospitalization Follow-up     History of Present Illness:    Eddie Kelly is a 89 y.o. male.  Patient has past medical history of permanent atrial fibrillation post Watchman device insertion.  He has history of essential hypertension and recurrent pleural effusion.  He denies any problems at this time and takes care of activities of daily living.  No chest pain orthopnea or PND.  At the time of my  evaluation, the patient is alert awake oriented and in no distress.  Past Medical History:  Diagnosis Date   A-fib [facility])    Anxiety and depression    Aspiration pneumonia (HCC) 06/27/2019   CAD (coronary artery disease)    Chronic atrial fibrillation (HCC) 09/06/2019   Epistaxis 06/27/2019   Essential hypertension 04/12/2018   Hypertension 09/11/2019   Hypertensive retinopathy    OU   Hypothyroidism    Left inguinal hernia 08/10/2017   Macular degeneration    Wet OD; Dry OS   Permanent atrial fibrillation (HCC) 01/14/2019   Postoperative examination 11/29/2017   Preoperative cardiovascular examination 09/26/2017   Presence of Watchman left atrial appendage closure device 02/04/2020   PVC (premature ventricular contraction)    Sinus bradycardia    Vitamin D deficiency     Past Surgical History:  Procedure Laterality Date   CATARACT EXTRACTION Bilateral    CHOLECYSTECTOMY     [facility] [place]   EYE SURGERY Bilateral    Cat Sx   HERNIA REPAIR      Current Medications: Current Meds  Medication Sig   albuterol (PROVENTIL) (2.5 MG/3ML) 0.083% nebulizer solution Take 2.5 mg by nebulization every 6 (six) hours as needed for wheezing or shortness of breath.   aspirin EC 81 MG tablet Take 81 mg by mouth daily.   bethanechol (URECHOLINE) 25 MG tablet Take 25 mg by mouth 2 (two) times daily.   buPROPion [facility]  XL) 150 MG 24 hr tablet Take 300 mg by mouth daily.   cyanocobalamin (,VITAMIN B-12,) 1000 MCG/ML injection Inject 1,000 mcg into the muscle every 30 (thirty) days.   diclofenac Sodium (VOLTAREN) 1 % GEL Apply 1 application topically 3 (three) times daily as needed for pain.   ferrous sulfate 325 (65 FE) MG EC tablet Take 325 mg by mouth 3 (three) times daily with meals.   finasteride (PROSCAR) 5 MG tablet Take 5 mg by mouth daily.   GLUCOSAMINE-CHONDROITIN PO Take 2 tablets by mouth daily.   levothyroxine (SYNTHROID) 100 MCG tablet Take 100 mcg by mouth daily.    loratadine (CLARITIN) 10 MG tablet Take 10 mg by mouth daily.   memantine (NAMENDA) 10 MG tablet Take 10 mg by mouth daily.   montelukast (SINGULAIR) 10 MG tablet Take 10 mg by mouth at bedtime.   Multiple Vitamin (MULTIVITAMIN WITH MINERALS) TABS tablet Take 1 tablet by mouth daily.   Multiple Vitamins-Minerals (CENTRUM SILVER PO) Take 1 tablet by mouth daily.   nitroGLYCERIN (NITROSTAT) 0.4 MG SL tablet Place 0.4 mg under the tongue every 5 (five) minutes as needed for chest pain.   Probiotic Product (PROBIOTIC DAILY PO) Take 2 capsules by mouth daily.   saw palmetto 80 MG capsule Take 80 mg by mouth daily.   tamsulosin (FLOMAX) 0.4 MG CAPS capsule Take 1 capsule by mouth daily.   torsemide (DEMADEX) 20 MG tablet Take 10 mg by mouth daily.     Allergies:   Ciprofloxacin and Levofloxacin   Social History   Socioeconomic History   Marital status: Married    Spouse name: Not on file   Number of children: Not on file   Years of education: Not on file   Highest education level: Not on file  Occupational History   Not on file  Tobacco Use   Smoking status: Former    Current packs/day: 0.00    Types: Cigarettes    Quit date: 33    Years since quitting: 41.9   Smokeless tobacco: Never  Vaping Use   Vaping status: Never Used  Substance and Sexual Activity   Alcohol use: Not Currently    Alcohol/week: 1.0 standard drink of alcohol    Types: 1 Cans of beer per week    Comment: "every once in a blue moon"   Drug use: No   Sexual activity: Not Currently  Other Topics Concern   Not on file  Social History Narrative   Not on file   Social Drivers of Health   Financial Resource Strain: Low Risk  (11/07/2019)   Received from [facility], [facility]   Overall Financial Resource Strain (CARDIA)    Difficulty of Paying Living Expenses: Not hard at all  Food Insecurity: No Food Insecurity (11/07/2019)   Received from [facility], [facility]   Hunger Vital Sign     Worried About Running Out of Food in the Last Year: Never true    Ran Out of Food in the Last Year: Never true  Transportation Needs: No Transportation Needs (11/07/2019)   Received from [facility], [facility]   PRAPARE - Transportation    Lack of Transportation (Medical): No    Lack of Transportation (Non-Medical): No  Physical Activity: Not on file  Stress: Not on file  Social Connections: Not on file     Family History: The patient's family history includes Cancer in his father; Colon cancer in his father;  Diabetes in his brother; Heart attack in his father; Heart disease in his brother. There is no history of Rectal cancer or Stomach cancer.  ROS:   Please see the history of present illness.    All other systems reviewed and are negative.  EKGs/Labs/Other Studies Reviewed:    The following studies were reviewed today: I discussed my findings with the patient at length   Recent Labs: No results found for requested labs within last 365 days.  Recent Lipid Panel    Component Value Date/Time   CHOL 149 01/14/2019 1055   TRIG 54 01/14/2019 1055   HDL 45 01/14/2019 1055   CHOLHDL 3.3 01/14/2019 1055   LDLCALC 93 01/14/2019 1055    Physical Exam:    VS:  BP (!) 144/74 (BP Location: Left Arm, Patient Position: Sitting)   Pulse 76   Ht 6\' 2"  (1.88 m)   Wt 162 lb (73.5 kg)   SpO2 96%   BMI 20.80 kg/m     Wt Readings from Last 3 Encounters:  02/09/23 162 lb (73.5 kg)  10/25/22 170 lb 6.4 oz (77.3 kg)  04/07/22 178 lb 8 oz (81 kg)     GEN: Patient is in no acute distress HEENT: Normal NECK: No JVD; No carotid bruits LYMPHATICS: No lymphadenopathy CARDIAC: Hear sounds irregular, 2/6 systolic murmur at the apex. RESPIRATORY:  Clear to auscultation without rales, wheezing or rhonchi.  Reduced sounds at right mid and lower base. ABDOMEN: Soft, non-tender, non-distended MUSCULOSKELETAL:  No edema; No deformity  SKIN: Warm and dry NEUROLOGIC:  Alert and  oriented x 3 PSYCHIATRIC:  Normal affect   Signed, Garwin Brothers, MD  02/09/2023 11:43 AM    [facility]

## 2023-02-09 NOTE — Telephone Encounter (Signed)
-----   Message from Aundra Dubin Revankar sent at 02/09/2023  3:27 PM EST ----- He needs to be seen by interventional radiology soon for a tap and other therapies ----- Message ----- From: Cordie Grice Sent: 02/09/2023   3:22 PM EST To: Garwin Brothers, MD

## 2023-02-10 LAB — BASIC METABOLIC PANEL
BUN/Creatinine Ratio: 18 (ref 10–24)
BUN: 28 mg/dL (ref 10–36)
CO2: 24 mmol/L (ref 20–29)
Calcium: 9.2 mg/dL (ref 8.6–10.2)
Chloride: 101 mmol/L (ref 96–106)
Creatinine, Ser: 1.53 mg/dL — ABNORMAL HIGH (ref 0.76–1.27)
Glucose: 81 mg/dL (ref 70–99)
Potassium: 4.9 mmol/L (ref 3.5–5.2)
Sodium: 143 mmol/L (ref 134–144)
eGFR: 42 mL/min/{1.73_m2} — ABNORMAL LOW (ref >59)

## 2023-02-20 NOTE — Addendum Note (Signed)
Addended by: Eleonore Chiquito on: 02/20/2023 07:45 AM   Modules accepted: Orders

## 2023-02-21 ENCOUNTER — Telehealth: Payer: Self-pay

## 2023-02-21 NOTE — Telephone Encounter (Signed)
-----   Message from Jennifer SAUNDERS Revankar sent at 02/18/2023  2:21 PM EST ----- The results of the study is unremarkable. Please inform patient. I will discuss in detail at next appointment. Cc  primary care/referring physician Jennifer SAUNDERS Crape, MD 02/18/2023 2:21 PM

## 2023-02-21 NOTE — Telephone Encounter (Signed)
Per DPR [name] called.  Left message for [name]/patient to call back.

## 2023-02-23 DIAGNOSIS — R918 Other nonspecific abnormal finding of lung field: Secondary | ICD-10-CM | POA: Diagnosis not present

## 2023-02-23 DIAGNOSIS — J449 Chronic obstructive pulmonary disease, unspecified: Secondary | ICD-10-CM | POA: Diagnosis not present

## 2023-02-23 DIAGNOSIS — J9611 Chronic respiratory failure with hypoxia: Secondary | ICD-10-CM | POA: Diagnosis not present

## 2023-02-27 ENCOUNTER — Telehealth: Payer: Self-pay | Admitting: Cardiology

## 2023-02-27 NOTE — Telephone Encounter (Signed)
 Eddie Kelly from [facility] called wanting to know if they still need to call patient to schedule his THORACENTESIS or if patient went elsewhere to have this done.  At the time they got referral they didn't have a provider who could do it.

## 2023-02-27 NOTE — Telephone Encounter (Signed)
 Spoke with Misty Stanley and advised that the pt has not been scheduled at [facility] and ok to schedule at [facility]. Last office note and medication list faxed.

## 2023-02-27 NOTE — Telephone Encounter (Signed)
 Left vm that pt can have done at [facility] as it has not been scheduled at [facility] and the pt prefers to stay local.

## 2023-02-28 DIAGNOSIS — Z881 Allergy status to other antibiotic agents status: Secondary | ICD-10-CM | POA: Diagnosis not present

## 2023-02-28 DIAGNOSIS — I4821 Permanent atrial fibrillation: Secondary | ICD-10-CM | POA: Diagnosis not present

## 2023-02-28 DIAGNOSIS — L89152 Pressure ulcer of sacral region, stage 2: Secondary | ICD-10-CM | POA: Diagnosis not present

## 2023-02-28 DIAGNOSIS — J690 Pneumonitis due to inhalation of food and vomit: Secondary | ICD-10-CM | POA: Diagnosis not present

## 2023-02-28 DIAGNOSIS — Z79899 Other long term (current) drug therapy: Secondary | ICD-10-CM | POA: Diagnosis not present

## 2023-02-28 DIAGNOSIS — R069 Unspecified abnormalities of breathing: Secondary | ICD-10-CM | POA: Diagnosis not present

## 2023-02-28 DIAGNOSIS — I5033 Acute on chronic diastolic (congestive) heart failure: Secondary | ICD-10-CM | POA: Diagnosis not present

## 2023-02-28 DIAGNOSIS — R9431 Abnormal electrocardiogram [ECG] [EKG]: Secondary | ICD-10-CM | POA: Diagnosis not present

## 2023-02-28 DIAGNOSIS — R531 Weakness: Secondary | ICD-10-CM | POA: Diagnosis not present

## 2023-02-28 DIAGNOSIS — I4891 Unspecified atrial fibrillation: Secondary | ICD-10-CM | POA: Diagnosis not present

## 2023-02-28 DIAGNOSIS — Z7982 Long term (current) use of aspirin: Secondary | ICD-10-CM | POA: Diagnosis not present

## 2023-02-28 DIAGNOSIS — I2510 Atherosclerotic heart disease of native coronary artery without angina pectoris: Secondary | ICD-10-CM | POA: Diagnosis not present

## 2023-02-28 DIAGNOSIS — D638 Anemia in other chronic diseases classified elsewhere: Secondary | ICD-10-CM | POA: Diagnosis not present

## 2023-02-28 DIAGNOSIS — J9690 Respiratory failure, unspecified, unspecified whether with hypoxia or hypercapnia: Secondary | ICD-10-CM | POA: Diagnosis not present

## 2023-02-28 DIAGNOSIS — J9691 Respiratory failure, unspecified with hypoxia: Secondary | ICD-10-CM | POA: Diagnosis not present

## 2023-02-28 DIAGNOSIS — J918 Pleural effusion in other conditions classified elsewhere: Secondary | ICD-10-CM | POA: Diagnosis not present

## 2023-02-28 DIAGNOSIS — Z87891 Personal history of nicotine dependence: Secondary | ICD-10-CM | POA: Diagnosis not present

## 2023-02-28 DIAGNOSIS — I5032 Chronic diastolic (congestive) heart failure: Secondary | ICD-10-CM | POA: Diagnosis not present

## 2023-02-28 DIAGNOSIS — I509 Heart failure, unspecified: Secondary | ICD-10-CM | POA: Diagnosis not present

## 2023-02-28 DIAGNOSIS — J440 Chronic obstructive pulmonary disease with acute lower respiratory infection: Secondary | ICD-10-CM | POA: Diagnosis not present

## 2023-02-28 DIAGNOSIS — I499 Cardiac arrhythmia, unspecified: Secondary | ICD-10-CM | POA: Diagnosis not present

## 2023-02-28 DIAGNOSIS — M1990 Unspecified osteoarthritis, unspecified site: Secondary | ICD-10-CM | POA: Diagnosis not present

## 2023-02-28 DIAGNOSIS — E7800 Pure hypercholesterolemia, unspecified: Secondary | ICD-10-CM | POA: Diagnosis not present

## 2023-02-28 DIAGNOSIS — J9621 Acute and chronic respiratory failure with hypoxia: Secondary | ICD-10-CM | POA: Diagnosis not present

## 2023-02-28 DIAGNOSIS — J441 Chronic obstructive pulmonary disease with (acute) exacerbation: Secondary | ICD-10-CM | POA: Diagnosis not present

## 2023-02-28 DIAGNOSIS — J9600 Acute respiratory failure, unspecified whether with hypoxia or hypercapnia: Secondary | ICD-10-CM | POA: Diagnosis not present

## 2023-02-28 DIAGNOSIS — I2109 ST elevation (STEMI) myocardial infarction involving other coronary artery of anterior wall: Secondary | ICD-10-CM | POA: Diagnosis not present

## 2023-02-28 DIAGNOSIS — Z87440 Personal history of urinary (tract) infections: Secondary | ICD-10-CM | POA: Diagnosis not present

## 2023-02-28 DIAGNOSIS — N183 Chronic kidney disease, stage 3 unspecified: Secondary | ICD-10-CM | POA: Diagnosis not present

## 2023-02-28 DIAGNOSIS — I4729 Other ventricular tachycardia: Secondary | ICD-10-CM | POA: Diagnosis not present

## 2023-02-28 DIAGNOSIS — E039 Hypothyroidism, unspecified: Secondary | ICD-10-CM | POA: Diagnosis not present

## 2023-02-28 DIAGNOSIS — I444 Left anterior fascicular block: Secondary | ICD-10-CM | POA: Diagnosis not present

## 2023-02-28 DIAGNOSIS — J90 Pleural effusion, not elsewhere classified: Secondary | ICD-10-CM | POA: Diagnosis not present

## 2023-02-28 DIAGNOSIS — R0689 Other abnormalities of breathing: Secondary | ICD-10-CM | POA: Diagnosis not present

## 2023-02-28 DIAGNOSIS — Z9981 Dependence on supplemental oxygen: Secondary | ICD-10-CM | POA: Diagnosis not present

## 2023-02-28 DIAGNOSIS — R000 Tachycardia, unspecified: Secondary | ICD-10-CM | POA: Diagnosis not present

## 2023-03-02 DIAGNOSIS — J9690 Respiratory failure, unspecified, unspecified whether with hypoxia or hypercapnia: Secondary | ICD-10-CM | POA: Diagnosis not present

## 2023-03-02 DIAGNOSIS — I5032 Chronic diastolic (congestive) heart failure: Secondary | ICD-10-CM | POA: Diagnosis not present

## 2023-03-02 DIAGNOSIS — J9600 Acute respiratory failure, unspecified whether with hypoxia or hypercapnia: Secondary | ICD-10-CM | POA: Diagnosis not present

## 2023-03-02 DIAGNOSIS — J90 Pleural effusion, not elsewhere classified: Secondary | ICD-10-CM | POA: Diagnosis not present

## 2023-03-02 DIAGNOSIS — I4821 Permanent atrial fibrillation: Secondary | ICD-10-CM | POA: Diagnosis not present

## 2023-03-06 DIAGNOSIS — D500 Iron deficiency anemia secondary to blood loss (chronic): Secondary | ICD-10-CM | POA: Diagnosis not present

## 2023-03-06 DIAGNOSIS — G629 Polyneuropathy, unspecified: Secondary | ICD-10-CM | POA: Diagnosis not present

## 2023-03-06 DIAGNOSIS — J918 Pleural effusion in other conditions classified elsewhere: Secondary | ICD-10-CM | POA: Diagnosis not present

## 2023-03-06 DIAGNOSIS — J441 Chronic obstructive pulmonary disease with (acute) exacerbation: Secondary | ICD-10-CM | POA: Diagnosis not present

## 2023-03-06 DIAGNOSIS — I110 Hypertensive heart disease with heart failure: Secondary | ICD-10-CM | POA: Diagnosis not present

## 2023-03-06 DIAGNOSIS — M170 Bilateral primary osteoarthritis of knee: Secondary | ICD-10-CM | POA: Diagnosis not present

## 2023-03-06 DIAGNOSIS — J440 Chronic obstructive pulmonary disease with acute lower respiratory infection: Secondary | ICD-10-CM | POA: Diagnosis not present

## 2023-03-06 DIAGNOSIS — E039 Hypothyroidism, unspecified: Secondary | ICD-10-CM | POA: Diagnosis not present

## 2023-03-06 DIAGNOSIS — I2510 Atherosclerotic heart disease of native coronary artery without angina pectoris: Secondary | ICD-10-CM | POA: Diagnosis not present

## 2023-03-06 DIAGNOSIS — G8929 Other chronic pain: Secondary | ICD-10-CM | POA: Diagnosis not present

## 2023-03-06 DIAGNOSIS — I5033 Acute on chronic diastolic (congestive) heart failure: Secondary | ICD-10-CM | POA: Diagnosis not present

## 2023-03-06 DIAGNOSIS — J9621 Acute and chronic respiratory failure with hypoxia: Secondary | ICD-10-CM | POA: Diagnosis not present

## 2023-03-06 DIAGNOSIS — D510 Vitamin B12 deficiency anemia due to intrinsic factor deficiency: Secondary | ICD-10-CM | POA: Diagnosis not present

## 2023-03-06 DIAGNOSIS — I071 Rheumatic tricuspid insufficiency: Secondary | ICD-10-CM | POA: Diagnosis not present

## 2023-03-06 DIAGNOSIS — I482 Chronic atrial fibrillation, unspecified: Secondary | ICD-10-CM | POA: Diagnosis not present

## 2023-03-06 DIAGNOSIS — M545 Low back pain, unspecified: Secondary | ICD-10-CM | POA: Diagnosis not present

## 2023-03-06 DIAGNOSIS — E7800 Pure hypercholesterolemia, unspecified: Secondary | ICD-10-CM | POA: Diagnosis not present

## 2023-03-06 DIAGNOSIS — N390 Urinary tract infection, site not specified: Secondary | ICD-10-CM | POA: Diagnosis not present

## 2023-03-06 DIAGNOSIS — M47819 Spondylosis without myelopathy or radiculopathy, site unspecified: Secondary | ICD-10-CM | POA: Diagnosis not present

## 2023-03-06 DIAGNOSIS — I472 Ventricular tachycardia, unspecified: Secondary | ICD-10-CM | POA: Diagnosis not present

## 2023-03-07 ENCOUNTER — Encounter: Payer: Self-pay | Admitting: Internal Medicine

## 2023-03-08 DIAGNOSIS — Z9981 Dependence on supplemental oxygen: Secondary | ICD-10-CM | POA: Diagnosis not present

## 2023-03-08 DIAGNOSIS — Z09 Encounter for follow-up examination after completed treatment for conditions other than malignant neoplasm: Secondary | ICD-10-CM | POA: Diagnosis not present

## 2023-03-08 DIAGNOSIS — J90 Pleural effusion, not elsewhere classified: Secondary | ICD-10-CM | POA: Diagnosis not present

## 2023-03-08 DIAGNOSIS — R6889 Other general symptoms and signs: Secondary | ICD-10-CM | POA: Diagnosis not present

## 2023-03-08 DIAGNOSIS — Z7409 Other reduced mobility: Secondary | ICD-10-CM | POA: Diagnosis not present

## 2023-03-08 DIAGNOSIS — R531 Weakness: Secondary | ICD-10-CM | POA: Diagnosis not present

## 2023-03-09 DIAGNOSIS — J440 Chronic obstructive pulmonary disease with acute lower respiratory infection: Secondary | ICD-10-CM | POA: Diagnosis not present

## 2023-03-09 DIAGNOSIS — G8929 Other chronic pain: Secondary | ICD-10-CM | POA: Diagnosis not present

## 2023-03-09 DIAGNOSIS — I110 Hypertensive heart disease with heart failure: Secondary | ICD-10-CM | POA: Diagnosis not present

## 2023-03-09 DIAGNOSIS — I5033 Acute on chronic diastolic (congestive) heart failure: Secondary | ICD-10-CM | POA: Diagnosis not present

## 2023-03-09 DIAGNOSIS — E039 Hypothyroidism, unspecified: Secondary | ICD-10-CM | POA: Diagnosis not present

## 2023-03-09 DIAGNOSIS — N390 Urinary tract infection, site not specified: Secondary | ICD-10-CM | POA: Diagnosis not present

## 2023-03-09 DIAGNOSIS — M47819 Spondylosis without myelopathy or radiculopathy, site unspecified: Secondary | ICD-10-CM | POA: Diagnosis not present

## 2023-03-09 DIAGNOSIS — J918 Pleural effusion in other conditions classified elsewhere: Secondary | ICD-10-CM | POA: Diagnosis not present

## 2023-03-09 DIAGNOSIS — D510 Vitamin B12 deficiency anemia due to intrinsic factor deficiency: Secondary | ICD-10-CM | POA: Diagnosis not present

## 2023-03-09 DIAGNOSIS — M545 Low back pain, unspecified: Secondary | ICD-10-CM | POA: Diagnosis not present

## 2023-03-09 DIAGNOSIS — D500 Iron deficiency anemia secondary to blood loss (chronic): Secondary | ICD-10-CM | POA: Diagnosis not present

## 2023-03-09 DIAGNOSIS — J441 Chronic obstructive pulmonary disease with (acute) exacerbation: Secondary | ICD-10-CM | POA: Diagnosis not present

## 2023-03-09 DIAGNOSIS — E7800 Pure hypercholesterolemia, unspecified: Secondary | ICD-10-CM | POA: Diagnosis not present

## 2023-03-09 DIAGNOSIS — I482 Chronic atrial fibrillation, unspecified: Secondary | ICD-10-CM | POA: Diagnosis not present

## 2023-03-09 DIAGNOSIS — J9621 Acute and chronic respiratory failure with hypoxia: Secondary | ICD-10-CM | POA: Diagnosis not present

## 2023-03-09 DIAGNOSIS — M170 Bilateral primary osteoarthritis of knee: Secondary | ICD-10-CM | POA: Diagnosis not present

## 2023-03-09 DIAGNOSIS — I2510 Atherosclerotic heart disease of native coronary artery without angina pectoris: Secondary | ICD-10-CM | POA: Diagnosis not present

## 2023-03-09 DIAGNOSIS — G629 Polyneuropathy, unspecified: Secondary | ICD-10-CM | POA: Diagnosis not present

## 2023-03-09 DIAGNOSIS — I472 Ventricular tachycardia, unspecified: Secondary | ICD-10-CM | POA: Diagnosis not present

## 2023-03-09 DIAGNOSIS — I071 Rheumatic tricuspid insufficiency: Secondary | ICD-10-CM | POA: Diagnosis not present

## 2023-03-13 DIAGNOSIS — R918 Other nonspecific abnormal finding of lung field: Secondary | ICD-10-CM | POA: Diagnosis not present

## 2023-03-13 DIAGNOSIS — J9611 Chronic respiratory failure with hypoxia: Secondary | ICD-10-CM | POA: Diagnosis not present

## 2023-03-13 DIAGNOSIS — J449 Chronic obstructive pulmonary disease, unspecified: Secondary | ICD-10-CM | POA: Diagnosis not present

## 2023-03-14 DIAGNOSIS — I482 Chronic atrial fibrillation, unspecified: Secondary | ICD-10-CM | POA: Diagnosis not present

## 2023-03-14 DIAGNOSIS — D510 Vitamin B12 deficiency anemia due to intrinsic factor deficiency: Secondary | ICD-10-CM | POA: Diagnosis not present

## 2023-03-14 DIAGNOSIS — J441 Chronic obstructive pulmonary disease with (acute) exacerbation: Secondary | ICD-10-CM | POA: Diagnosis not present

## 2023-03-14 DIAGNOSIS — E7800 Pure hypercholesterolemia, unspecified: Secondary | ICD-10-CM | POA: Diagnosis not present

## 2023-03-14 DIAGNOSIS — I110 Hypertensive heart disease with heart failure: Secondary | ICD-10-CM | POA: Diagnosis not present

## 2023-03-14 DIAGNOSIS — N390 Urinary tract infection, site not specified: Secondary | ICD-10-CM | POA: Diagnosis not present

## 2023-03-14 DIAGNOSIS — I2510 Atherosclerotic heart disease of native coronary artery without angina pectoris: Secondary | ICD-10-CM | POA: Diagnosis not present

## 2023-03-14 DIAGNOSIS — M545 Low back pain, unspecified: Secondary | ICD-10-CM | POA: Diagnosis not present

## 2023-03-14 DIAGNOSIS — I472 Ventricular tachycardia, unspecified: Secondary | ICD-10-CM | POA: Diagnosis not present

## 2023-03-14 DIAGNOSIS — I071 Rheumatic tricuspid insufficiency: Secondary | ICD-10-CM | POA: Diagnosis not present

## 2023-03-14 DIAGNOSIS — G629 Polyneuropathy, unspecified: Secondary | ICD-10-CM | POA: Diagnosis not present

## 2023-03-14 DIAGNOSIS — G8929 Other chronic pain: Secondary | ICD-10-CM | POA: Diagnosis not present

## 2023-03-14 DIAGNOSIS — D500 Iron deficiency anemia secondary to blood loss (chronic): Secondary | ICD-10-CM | POA: Diagnosis not present

## 2023-03-14 DIAGNOSIS — E039 Hypothyroidism, unspecified: Secondary | ICD-10-CM | POA: Diagnosis not present

## 2023-03-14 DIAGNOSIS — M170 Bilateral primary osteoarthritis of knee: Secondary | ICD-10-CM | POA: Diagnosis not present

## 2023-03-14 DIAGNOSIS — I5033 Acute on chronic diastolic (congestive) heart failure: Secondary | ICD-10-CM | POA: Diagnosis not present

## 2023-03-14 DIAGNOSIS — J9621 Acute and chronic respiratory failure with hypoxia: Secondary | ICD-10-CM | POA: Diagnosis not present

## 2023-03-14 DIAGNOSIS — J440 Chronic obstructive pulmonary disease with acute lower respiratory infection: Secondary | ICD-10-CM | POA: Diagnosis not present

## 2023-03-14 DIAGNOSIS — J918 Pleural effusion in other conditions classified elsewhere: Secondary | ICD-10-CM | POA: Diagnosis not present

## 2023-03-14 DIAGNOSIS — M47819 Spondylosis without myelopathy or radiculopathy, site unspecified: Secondary | ICD-10-CM | POA: Diagnosis not present

## 2023-03-16 ENCOUNTER — Encounter: Payer: Self-pay | Admitting: Cardiology

## 2023-03-16 ENCOUNTER — Ambulatory Visit: Payer: Medicare Other | Attending: Cardiology | Admitting: Cardiology

## 2023-03-16 VITALS — BP 110/66 | HR 66 | Ht 74.0 in | Wt 157.0 lb

## 2023-03-16 DIAGNOSIS — R0600 Dyspnea, unspecified: Secondary | ICD-10-CM

## 2023-03-16 DIAGNOSIS — E7800 Pure hypercholesterolemia, unspecified: Secondary | ICD-10-CM | POA: Diagnosis not present

## 2023-03-16 DIAGNOSIS — J441 Chronic obstructive pulmonary disease with (acute) exacerbation: Secondary | ICD-10-CM | POA: Diagnosis not present

## 2023-03-16 DIAGNOSIS — Z95818 Presence of other cardiac implants and grafts: Secondary | ICD-10-CM | POA: Diagnosis not present

## 2023-03-16 DIAGNOSIS — D510 Vitamin B12 deficiency anemia due to intrinsic factor deficiency: Secondary | ICD-10-CM | POA: Diagnosis not present

## 2023-03-16 DIAGNOSIS — I071 Rheumatic tricuspid insufficiency: Secondary | ICD-10-CM | POA: Diagnosis not present

## 2023-03-16 DIAGNOSIS — E039 Hypothyroidism, unspecified: Secondary | ICD-10-CM | POA: Diagnosis not present

## 2023-03-16 DIAGNOSIS — N390 Urinary tract infection, site not specified: Secondary | ICD-10-CM | POA: Diagnosis not present

## 2023-03-16 DIAGNOSIS — I10 Essential (primary) hypertension: Secondary | ICD-10-CM | POA: Diagnosis not present

## 2023-03-16 DIAGNOSIS — I5033 Acute on chronic diastolic (congestive) heart failure: Secondary | ICD-10-CM | POA: Diagnosis not present

## 2023-03-16 DIAGNOSIS — M47819 Spondylosis without myelopathy or radiculopathy, site unspecified: Secondary | ICD-10-CM | POA: Diagnosis not present

## 2023-03-16 DIAGNOSIS — I5032 Chronic diastolic (congestive) heart failure: Secondary | ICD-10-CM | POA: Diagnosis not present

## 2023-03-16 DIAGNOSIS — I4821 Permanent atrial fibrillation: Secondary | ICD-10-CM

## 2023-03-16 DIAGNOSIS — I110 Hypertensive heart disease with heart failure: Secondary | ICD-10-CM | POA: Diagnosis not present

## 2023-03-16 DIAGNOSIS — J9621 Acute and chronic respiratory failure with hypoxia: Secondary | ICD-10-CM | POA: Diagnosis not present

## 2023-03-16 DIAGNOSIS — M170 Bilateral primary osteoarthritis of knee: Secondary | ICD-10-CM | POA: Diagnosis not present

## 2023-03-16 DIAGNOSIS — D500 Iron deficiency anemia secondary to blood loss (chronic): Secondary | ICD-10-CM | POA: Diagnosis not present

## 2023-03-16 DIAGNOSIS — G629 Polyneuropathy, unspecified: Secondary | ICD-10-CM | POA: Diagnosis not present

## 2023-03-16 DIAGNOSIS — J440 Chronic obstructive pulmonary disease with acute lower respiratory infection: Secondary | ICD-10-CM | POA: Diagnosis not present

## 2023-03-16 DIAGNOSIS — G8929 Other chronic pain: Secondary | ICD-10-CM | POA: Diagnosis not present

## 2023-03-16 DIAGNOSIS — M545 Low back pain, unspecified: Secondary | ICD-10-CM | POA: Diagnosis not present

## 2023-03-16 DIAGNOSIS — J90 Pleural effusion, not elsewhere classified: Secondary | ICD-10-CM | POA: Diagnosis not present

## 2023-03-16 DIAGNOSIS — I482 Chronic atrial fibrillation, unspecified: Secondary | ICD-10-CM | POA: Diagnosis not present

## 2023-03-16 DIAGNOSIS — I2510 Atherosclerotic heart disease of native coronary artery without angina pectoris: Secondary | ICD-10-CM | POA: Diagnosis not present

## 2023-03-16 DIAGNOSIS — J918 Pleural effusion in other conditions classified elsewhere: Secondary | ICD-10-CM | POA: Diagnosis not present

## 2023-03-16 DIAGNOSIS — I472 Ventricular tachycardia, unspecified: Secondary | ICD-10-CM | POA: Diagnosis not present

## 2023-03-16 MED ORDER — TORSEMIDE 20 MG PO TABS: 30.0000 mg | ORAL_TABLET | Freq: Every day | ORAL | 3 refills | Status: DC

## 2023-03-16 MED ORDER — METOPROLOL TARTRATE 25 MG PO TABS: 12.5000 mg | ORAL_TABLET | Freq: Two times a day (BID) | ORAL | 3 refills | Status: DC

## 2023-03-16 NOTE — Progress Notes (Signed)
Cardiology Office Note:  .   Date:  03/16/2023  ID:  Eddie Kelly, DOB Oct 31, 1925, MRN [id number] PCP: [name], MD  [facility] Providers Cardiologist:  Garwin Brothers, MD    History of Present Illness: .   Eddie Kelly is a 89 y.o. male with a past medical history of hypertension, permanent atrial fibrillation with presence of Watchman device, CAD, PVCs, hypothyroidism.  02/01/2023 echo EF 55%, RA severely dilated, mild MR, mildly sclerotic aortic valve, PASP 41 mmHg 10/25/2022 monitor atrial fibrillation 100% of the time, average heart rate of 70 bpm, 342 episodes of VT runs occurred 10/27/2021 echo EF 60 to 65%, LA mildly dilated, RA moderately dilated, mild MR, moderate TR, PASP moderately elevated at 47 mmHg  Most recently she was evaluated by Dr. Tomie China on 02/09/2023, Eddie Kelly was stable from a cardiac perspective.  Eddie Kelly had been dealing with recurrent pleural effusions and Eddie Kelly recently had 4 thoracentesis >> sent to the hospital for further evaluation and interventional radiology.  Eddie Kelly was admitted on 02/28/2023 to 03/03/2023 to [facility], Eddie Kelly underwent a thoracentesis on the seventh which yielded 1.3 L.  Pulmonology and cardiology were consulted, Pleurx catheter was not recommended unless Eddie Kelly decided for hospice route, it was ultimately felt that was likely due to diastolic heart failure with recommendations to repeat thoracentesis on an outpatient need.  Creatinine 1.2, potassium 4.8.  Eddie Kelly presents today accompanied by several children for follow-up after recent hospitalization as outlined above.  Followed up with pulmonology on Monday of this week, was advised that his pleural effusion was recurring, Eddie Kelly will see them again in a few days.  Eddie Kelly says Eddie Kelly feels great as long Eddie Kelly is sitting down and interactive, Eddie Kelly does feel very short of breath with any activities, mostly chair bound.  Eddie Kelly does live alone, has a live-in caregiver.  His family is questioning why this keeps  happening.  We did discuss that according to his most recent hospitalization it does not appear to be related to a malignant process.  They are very well aware that his condition is very fragile and any adjustments to medications might not be tolerated well.  They had some questions regarding a Pleurx drain, we did discuss this, as well as palliative versus hospice care. Eddie Kelly is sleeping up to 15 hours/day, no orthopnea, no edema.   ROS: Review of Systems  Constitutional:  Positive for malaise/fatigue.  Respiratory:  Positive for shortness of breath.   Musculoskeletal:  Positive for falls.  Endo/Heme/Allergies:  Bruises/bleeds easily.  All other systems reviewed and are negative.    Studies Reviewed: .        Cardiac Studies & Procedures      ECHOCARDIOGRAM  ECHOCARDIOGRAM COMPLETE 09/29/2017  Narrative *[facility]* [address] [place], [place] [address] [phone]  ------------------------------------------------------------------- Transthoracic Echocardiography  Patient:    Eddie Kelly, Eddie Kelly MR #:       [id number] Study Date: 09/29/2017 Gender:     M Age:        89 Height:     182.9 cm Weight:     91.2 kg BSA:        2.17 m^2 Pt. Status: Room:  Waymon Budge ATTENDING    Belva Crome, MD ORDERING     Belva Crome, MD SONOGRAPHER  Lanae Crumbly, RDCS PERFORMING   [facility], [facility]  cc:  ------------------------------------------------------------------- LV EF: 55% -   60%  ------------------------------------------------------------------- Indications:  V728.1 Pre-op evaluation.  Atrial fibrillation - 427.31.  ------------------------------------------------------------------- History:   Risk factors:  Hypertension.  ------------------------------------------------------------------- Study Conclusions  - Left ventricle: The cavity size was normal. Systolic function was normal. The estimated ejection fraction was in the range  of 55% to 60%. Wall motion was normal; there were no regional wall motion abnormalities. Doppler parameters are consistent with a reversible restrictive pattern, indicative of decreased left ventricular diastolic compliance and/or increased left atrial pressure (grade 3 diastolic dysfunction). - Mitral valve: There was mild regurgitation. - Left atrium: The atrium was mildly dilated. - Right atrium: The atrium was moderately dilated. - Tricuspid valve: There was moderate regurgitation. - Pulmonary arteries: PA peak pressure: 44 mm Hg (S).  Impressions:  - Normal LVEF. Mild LAE. Moderate RAE. Restrictive transmitral flow pattern.  ------------------------------------------------------------------- Study data:  No prior study was available for comparison.  Study status:  Routine.  Procedure:  The patient reported no pain pre or post test. Transthoracic echocardiography. Image quality was adequate.  Study completion:  There were no complications. Transthoracic echocardiography.  M-mode, complete 2D, spectral Doppler, and color Doppler.  Birthdate:  Patient birthdate: 1925/04/16.  Age:  Patient is 89 yr old.  Sex:  Gender: male. BMI: 27.3 kg/m^2.  Blood pressure:     174/98  Patient status: Outpatient.  Study date:  Study date: 09/29/2017. Study time: 08:21 AM.  Location:  Echo laboratory.  -------------------------------------------------------------------  ------------------------------------------------------------------- Left ventricle:  The cavity size was normal. Systolic function was normal. The estimated ejection fraction was in the range of 55% to 60%. Wall motion was normal; there were no regional wall motion abnormalities. Doppler parameters are consistent with a reversible restrictive pattern, indicative of decreased left ventricular diastolic compliance and/or increased left atrial pressure (grade 3 diastolic  dysfunction).  ------------------------------------------------------------------- Aortic valve:   Trileaflet; normal thickness leaflets. Mobility was not restricted. Sclerosis without stenosis.  Doppler: Transvalvular velocity was within the normal range. There was no stenosis. There was no regurgitation.  ------------------------------------------------------------------- Aorta:  Aortic root: The aortic root was normal in size.  ------------------------------------------------------------------- Mitral valve:   Structurally normal valve.   Mobility was not restricted.  Doppler:  Transvalvular velocity was within the normal range. There was no evidence for stenosis. There was mild regurgitation.    Valve area by pressure half-time: 5 cm^2. Indexed valve area by pressure half-time: 2.31 cm^2/m^2.    Peak gradient (D): 4 mm Hg.  ------------------------------------------------------------------- Left atrium:  The atrium was mildly dilated.  ------------------------------------------------------------------- Right ventricle:  The cavity size was normal. Wall thickness was normal. Systolic function was normal.  ------------------------------------------------------------------- Pulmonic valve:    Structurally normal valve.   Cusp separation was normal.  Doppler:  Transvalvular velocity was within the normal range. There was no evidence for stenosis. There was no regurgitation.  ------------------------------------------------------------------- Tricuspid valve:   Structurally normal valve.    Doppler: Transvalvular velocity was within the normal range. There was moderate regurgitation.  ------------------------------------------------------------------- Pulmonary artery:   The main pulmonary artery was normal-sized. Systolic pressure was within the normal range.  ------------------------------------------------------------------- Right atrium:  The atrium was moderately  dilated.  ------------------------------------------------------------------- Pericardium:  There was no pericardial effusion.  ------------------------------------------------------------------- Systemic veins: Inferior vena cava: The vessel was normal in size.  ------------------------------------------------------------------- Measurements  Left ventricle                         Value          Reference LV ID, ED, PLAX  chordal                47    mm       43 - 52 LV ID, ES, PLAX chordal                33    mm       23 - 38 LV fx shortening, PLAX chordal         30    %        >=29 LV PW thickness, ED                    12    mm       ---------- IVS/LV PW ratio, ED                    1              <=1.3 Stroke volume, 2D                      52    ml       ---------- Stroke volume/bsa, 2D                  24    ml/m^2   ---------- LV e&', lateral                         10.1  cm/s     ---------- LV E/e&', lateral                       10.1           ---------- LV e&', medial                          8.16  cm/s     ---------- LV E/e&', medial                        12.5           ---------- LV e&', average                         9.13  cm/s     ---------- LV E/e&', average                       11.17          ----------  Ventricular septum                     Value          Reference IVS thickness, ED                      12    mm       ----------  LVOT                                   Value          Reference LVOT ID, S                             21    mm       ----------  LVOT area                              3.46  cm^2     ---------- LVOT peak velocity, S                  73.9  cm/s     ---------- LVOT mean velocity, S                  49.2  cm/s     ---------- LVOT VTI, S                            15    cm       ---------- LVOT peak gradient, S                  2     mm Hg    ----------  Aorta                                  Value          Reference Aortic root ID,  ED                     40    mm       ---------- Ascending aorta ID, A-P, S             34    mm       ----------  Left atrium                            Value          Reference LA ID, A-P, ES                         51    mm       ---------- LA ID/bsa, A-P                 (H)     2.35  cm/m^2   <=2.2 LA volume, S                           77    ml       ---------- LA volume/bsa, S                       35.5  ml/m^2   ---------- LA volume, ES, 1-p A4C                 81.8  ml       ---------- LA volume/bsa, ES, 1-p A4C             37.8  ml/m^2   ---------- LA volume, ES, 1-p A2C                 69.7  ml       ---------- LA volume/bsa, ES, 1-p A2C             32.2  ml/m^2   ----------  Mitral valve  Value          Reference Mitral E-wave peak velocity            102   cm/s     ---------- Mitral A-wave peak velocity            30.6  cm/s     ---------- Mitral deceleration time               151   ms       150 - 230 Mitral pressure half-time              44    ms       ---------- Mitral peak gradient, D                4     mm Hg    ---------- Mitral E/A ratio, peak                 3.3            ---------- Mitral valve area, PHT, DP             5     cm^2     ---------- Mitral valve area/bsa, PHT, DP         2.31  cm^2/m^2 ----------  Pulmonary arteries                     Value          Reference PA pressure, S, DP             (H)     44    mm Hg    <=30  Tricuspid valve                        Value          Reference Tricuspid regurg peak velocity         322   cm/s     ---------- Tricuspid peak RV-RA gradient          41    mm Hg    ----------  Right atrium                           Value          Reference RA ID, S-I, ES, A4C            (H)     62.4  mm       34 - 49 RA area, ES, A4C               (H)     28.7  cm^2     8.3 - 19.5 RA volume, ES, A/L                     107   ml       ---------- RA volume/bsa, ES, A/L                 49.4  ml/m^2    ----------  Systemic veins                         Value          Reference Estimated CVP  3     mm Hg    ----------  Right ventricle                        Value          Reference TAPSE                                  16.9  mm       ---------- RV pressure, S, DP             (H)     44    mm Hg    <=30 RV s&', lateral, S                      11.1  cm/s     ----------  Legend: (L)  and  (H)  mark values outside specified reference range.  ------------------------------------------------------------------- Prepared and Electronically Authenticated by  Gypsy Balsam, MD 2019-08-09T17:37:30   MONITORS  LONG TERM MONITOR (3-14 DAYS) 11/16/2022  Narrative Patch Wear Time:  13 days and 21 hours (2024-09-03T12:10:20-0400 to 2024-09-17T09:30:05-398)    Atrial Fibrillation occurred continuously (100% burden), ranging from 41-134 bpm (avg of 70 bpm).  Isolated VEs were frequent (6.4%, [id number]), VE Couplets were occasional (1.5%, 8045), and VE Triplets were rare (<1.0%, 1282). Ventricular Bigeminy and Trigeminy were present. 342 Ventricular Tachycardia runs occurred, the run with the fastest interval lasting 4 beats with a max rate of 207 bpm, the longest lasting 12 beats with an avg rate of 115 bpm.           Risk Assessment/Calculations:    CHA2DS2-VASc Score = 5   This indicates a 7.2% annual risk of stroke. The patient's score is based upon: CHF History: 1 HTN History: 1 Diabetes History: 0 Stroke History: 0 Vascular Disease History: 1 Age Score: 2 Gender Score: 0            Physical Exam:   VS:  BP 110/66 (BP Location: Left Arm, Patient Position: Sitting)   Pulse 66   Ht 6\' 2"  (1.88 m)   Wt 157 lb (71.2 kg)   SpO2 99%   BMI 20.16 kg/m    Wt Readings from Last 3 Encounters:  03/16/23 157 lb (71.2 kg)  02/09/23 162 lb (73.5 kg)  10/25/22 170 lb 6.4 oz (77.3 kg)    GEN: Frail, no acute distress NECK: No JVD; No carotid bruits CARDIAC:  RRR, 2/6 systolic murmur, rubs, gallops RESPIRATORY:  RLL diminished, left lobe clear  ABDOMEN: Soft, non-tender, non-distended EXTREMITIES:  No edema; No deformity   ASSESSMENT AND PLAN: .   Pleural effusion/HFpEF -NYHA class III, Eddie Kelly continues to have recurrent pleural effusions, felt to be associated with his diastolic heart failure. Increase Demadex to 30 mg daily.  Following closely with pulmonology, saw them on Monday of this week >> will follow back up with them on Monday.  His lung sounds are diminished in his right lower lobe, SpO2 99%.  Sleeping well without orthopnea.  Currently on O2 continuously.  Will repeat BMET and proBNP.  Falls -family feels it is secondary to hypotension, blood pressure today is on the low normal side at 110/66, will decrease metoprolol to 12.5 mg twice daily.   Permanent atrial fibrillation/presence of Watchman device-heart rate is well-controlled at 66, continue metoprolol but we will reduce his dose to 12.5 mg twice daily.  Dispo: Labs per above, decrease metoprolol 12.5 mg twice daily, increase Demadex to 30 mg once daily follow-up in 1 month.  Signed, Flossie Dibble, NP

## 2023-03-16 NOTE — Patient Instructions (Signed)
Medication Instructions:  Your physician has recommended you make the following change in your medication:  Start Metoprolol Tartrate 12.5 mg two times daily Increase Torsemide to 1 and 1/2 tablets 30 mg once daily  *If you need a refill on your cardiac medications before your next appointment, please call your pharmacy*   Lab Work: Your physician recommends that you return for lab work in: Today for CBC, BMP and ProBNP  If you have labs (blood work) drawn today and your tests are completely normal, you will receive your results only by: MyChart Message (if you have MyChart) OR A paper copy in the mail If you have any lab test that is abnormal or we need to change your treatment, we will call you to review the results.   Testing/Procedures: NONE   Follow-Up: At [facility], you and your health needs are our priority.  As part of our continuing mission to provide you with exceptional heart care, we have created designated Provider Care Teams.  These Care Teams include your primary Cardiologist (physician) and Advanced Practice Providers (APPs -  Physician Assistants and Nurse Practitioners) who all work together to provide you with the care you need, when you need it.  We recommend signing up for the patient portal called "MyChart".  Sign up information is provided on this After Visit Summary.  MyChart is used to connect with patients for Virtual Visits (Telemedicine).  Patients are able to view lab/test results, encounter notes, upcoming appointments, etc.  Non-urgent messages can be sent to your provider as well.   To learn more about what you can do with MyChart, go to [email or web address].    Your next appointment:   4 week(s)  Provider:   Belva Crome, MD or Wallis Bamberg, NP   Other Instructions

## 2023-03-17 ENCOUNTER — Telehealth: Payer: Self-pay | Admitting: Emergency Medicine

## 2023-03-17 DIAGNOSIS — J440 Chronic obstructive pulmonary disease with acute lower respiratory infection: Secondary | ICD-10-CM | POA: Diagnosis not present

## 2023-03-17 DIAGNOSIS — D510 Vitamin B12 deficiency anemia due to intrinsic factor deficiency: Secondary | ICD-10-CM | POA: Diagnosis not present

## 2023-03-17 DIAGNOSIS — M170 Bilateral primary osteoarthritis of knee: Secondary | ICD-10-CM | POA: Diagnosis not present

## 2023-03-17 DIAGNOSIS — I472 Ventricular tachycardia, unspecified: Secondary | ICD-10-CM | POA: Diagnosis not present

## 2023-03-17 DIAGNOSIS — J918 Pleural effusion in other conditions classified elsewhere: Secondary | ICD-10-CM | POA: Diagnosis not present

## 2023-03-17 DIAGNOSIS — J441 Chronic obstructive pulmonary disease with (acute) exacerbation: Secondary | ICD-10-CM | POA: Diagnosis not present

## 2023-03-17 DIAGNOSIS — G8929 Other chronic pain: Secondary | ICD-10-CM | POA: Diagnosis not present

## 2023-03-17 DIAGNOSIS — D500 Iron deficiency anemia secondary to blood loss (chronic): Secondary | ICD-10-CM | POA: Diagnosis not present

## 2023-03-17 DIAGNOSIS — I110 Hypertensive heart disease with heart failure: Secondary | ICD-10-CM | POA: Diagnosis not present

## 2023-03-17 DIAGNOSIS — G629 Polyneuropathy, unspecified: Secondary | ICD-10-CM | POA: Diagnosis not present

## 2023-03-17 DIAGNOSIS — I2510 Atherosclerotic heart disease of native coronary artery without angina pectoris: Secondary | ICD-10-CM | POA: Diagnosis not present

## 2023-03-17 DIAGNOSIS — J9621 Acute and chronic respiratory failure with hypoxia: Secondary | ICD-10-CM | POA: Diagnosis not present

## 2023-03-17 DIAGNOSIS — E039 Hypothyroidism, unspecified: Secondary | ICD-10-CM | POA: Diagnosis not present

## 2023-03-17 DIAGNOSIS — N390 Urinary tract infection, site not specified: Secondary | ICD-10-CM | POA: Diagnosis not present

## 2023-03-17 DIAGNOSIS — I071 Rheumatic tricuspid insufficiency: Secondary | ICD-10-CM | POA: Diagnosis not present

## 2023-03-17 DIAGNOSIS — M47819 Spondylosis without myelopathy or radiculopathy, site unspecified: Secondary | ICD-10-CM | POA: Diagnosis not present

## 2023-03-17 DIAGNOSIS — E7800 Pure hypercholesterolemia, unspecified: Secondary | ICD-10-CM | POA: Diagnosis not present

## 2023-03-17 DIAGNOSIS — I482 Chronic atrial fibrillation, unspecified: Secondary | ICD-10-CM | POA: Diagnosis not present

## 2023-03-17 DIAGNOSIS — I5033 Acute on chronic diastolic (congestive) heart failure: Secondary | ICD-10-CM | POA: Diagnosis not present

## 2023-03-17 DIAGNOSIS — M545 Low back pain, unspecified: Secondary | ICD-10-CM | POA: Diagnosis not present

## 2023-03-17 LAB — BASIC METABOLIC PANEL WITH GFR
BUN/Creatinine Ratio: 22 (ref 10–24)
BUN: 37 mg/dL — ABNORMAL HIGH (ref 10–36)
CO2: 24 mmol/L (ref 20–29)
Calcium: 9.3 mg/dL (ref 8.6–10.2)
Chloride: 103 mmol/L (ref 96–106)
Creatinine, Ser: 1.71 mg/dL — ABNORMAL HIGH (ref 0.76–1.27)
Glucose: 98 mg/dL (ref 70–99)
Potassium: 3.8 mmol/L (ref 3.5–5.2)
Sodium: 150 mmol/L — ABNORMAL HIGH (ref 134–144)
eGFR: 36 mL/min/1.73 — ABNORMAL LOW

## 2023-03-17 LAB — CBC WITH DIFFERENTIAL/PLATELET
Basophils Absolute: 0.1 x10E3/uL (ref 0.0–0.2)
Basos: 1 %
EOS (ABSOLUTE): 0.2 x10E3/uL (ref 0.0–0.4)
Eos: 2 %
Hematocrit: 38.5 % (ref 37.5–51.0)
Hemoglobin: 13.1 g/dL (ref 13.0–17.7)
Immature Grans (Abs): 0 x10E3/uL (ref 0.0–0.1)
Immature Granulocytes: 0 %
Lymphocytes Absolute: 0.7 x10E3/uL (ref 0.7–3.1)
Lymphs: 8 %
MCH: 33.7 pg — ABNORMAL HIGH (ref 26.6–33.0)
MCHC: 34 g/dL (ref 31.5–35.7)
MCV: 99 fL — ABNORMAL HIGH (ref 79–97)
Monocytes Absolute: 0.4 x10E3/uL (ref 0.1–0.9)
Monocytes: 5 %
Neutrophils Absolute: 7.1 x10E3/uL — ABNORMAL HIGH (ref 1.4–7.0)
Neutrophils: 84 %
Platelets: 159 x10E3/uL (ref 150–450)
RBC: 3.89 x10E6/uL — ABNORMAL LOW (ref 4.14–5.80)
RDW: 12.3 % (ref 11.6–15.4)
WBC: 8.4 x10E3/uL (ref 3.4–10.8)

## 2023-03-17 LAB — PRO B NATRIURETIC PEPTIDE: NT-Pro BNP: 2231 pg/mL — ABNORMAL HIGH (ref 0–486)

## 2023-03-17 NOTE — Telephone Encounter (Signed)
-----   Message from Flossie Dibble sent at 03/17/2023  7:56 AM EST ----- Labs are overall okay.  It does look like he is probably dehydrated--try to get in an extra bottle of water per day.  It also looks like he is holding onto too much fluid--so we will continue with the plans we discussed yesterday to slightly increase his fluid pill.

## 2023-03-17 NOTE — Telephone Encounter (Signed)
Spoke to [employer] per [employer]. Reviewed results as per Jimmy Footman note. She verbalized understanding and had no further questions.

## 2023-03-17 NOTE — Telephone Encounter (Signed)
LVM 1/24

## 2023-03-17 NOTE — Telephone Encounter (Signed)
Wife was returning call. Please advise ?

## 2023-03-21 DIAGNOSIS — J9611 Chronic respiratory failure with hypoxia: Secondary | ICD-10-CM | POA: Diagnosis not present

## 2023-03-21 DIAGNOSIS — M47819 Spondylosis without myelopathy or radiculopathy, site unspecified: Secondary | ICD-10-CM | POA: Diagnosis not present

## 2023-03-21 DIAGNOSIS — J918 Pleural effusion in other conditions classified elsewhere: Secondary | ICD-10-CM | POA: Diagnosis not present

## 2023-03-21 DIAGNOSIS — I472 Ventricular tachycardia, unspecified: Secondary | ICD-10-CM | POA: Diagnosis not present

## 2023-03-21 DIAGNOSIS — M545 Low back pain, unspecified: Secondary | ICD-10-CM | POA: Diagnosis not present

## 2023-03-21 DIAGNOSIS — J9621 Acute and chronic respiratory failure with hypoxia: Secondary | ICD-10-CM | POA: Diagnosis not present

## 2023-03-21 DIAGNOSIS — D510 Vitamin B12 deficiency anemia due to intrinsic factor deficiency: Secondary | ICD-10-CM | POA: Diagnosis not present

## 2023-03-21 DIAGNOSIS — G629 Polyneuropathy, unspecified: Secondary | ICD-10-CM | POA: Diagnosis not present

## 2023-03-21 DIAGNOSIS — J440 Chronic obstructive pulmonary disease with acute lower respiratory infection: Secondary | ICD-10-CM | POA: Diagnosis not present

## 2023-03-21 DIAGNOSIS — N390 Urinary tract infection, site not specified: Secondary | ICD-10-CM | POA: Diagnosis not present

## 2023-03-21 DIAGNOSIS — J441 Chronic obstructive pulmonary disease with (acute) exacerbation: Secondary | ICD-10-CM | POA: Diagnosis not present

## 2023-03-21 DIAGNOSIS — I482 Chronic atrial fibrillation, unspecified: Secondary | ICD-10-CM | POA: Diagnosis not present

## 2023-03-21 DIAGNOSIS — I5033 Acute on chronic diastolic (congestive) heart failure: Secondary | ICD-10-CM | POA: Diagnosis not present

## 2023-03-21 DIAGNOSIS — I071 Rheumatic tricuspid insufficiency: Secondary | ICD-10-CM | POA: Diagnosis not present

## 2023-03-21 DIAGNOSIS — I110 Hypertensive heart disease with heart failure: Secondary | ICD-10-CM | POA: Diagnosis not present

## 2023-03-21 DIAGNOSIS — J449 Chronic obstructive pulmonary disease, unspecified: Secondary | ICD-10-CM | POA: Diagnosis not present

## 2023-03-21 DIAGNOSIS — E7800 Pure hypercholesterolemia, unspecified: Secondary | ICD-10-CM | POA: Diagnosis not present

## 2023-03-21 DIAGNOSIS — M170 Bilateral primary osteoarthritis of knee: Secondary | ICD-10-CM | POA: Diagnosis not present

## 2023-03-21 DIAGNOSIS — I2510 Atherosclerotic heart disease of native coronary artery without angina pectoris: Secondary | ICD-10-CM | POA: Diagnosis not present

## 2023-03-21 DIAGNOSIS — R918 Other nonspecific abnormal finding of lung field: Secondary | ICD-10-CM | POA: Diagnosis not present

## 2023-03-21 DIAGNOSIS — G8929 Other chronic pain: Secondary | ICD-10-CM | POA: Diagnosis not present

## 2023-03-21 DIAGNOSIS — E039 Hypothyroidism, unspecified: Secondary | ICD-10-CM | POA: Diagnosis not present

## 2023-03-21 DIAGNOSIS — D500 Iron deficiency anemia secondary to blood loss (chronic): Secondary | ICD-10-CM | POA: Diagnosis not present

## 2023-03-23 DIAGNOSIS — J9621 Acute and chronic respiratory failure with hypoxia: Secondary | ICD-10-CM | POA: Diagnosis not present

## 2023-03-23 DIAGNOSIS — N390 Urinary tract infection, site not specified: Secondary | ICD-10-CM | POA: Diagnosis not present

## 2023-03-23 DIAGNOSIS — M170 Bilateral primary osteoarthritis of knee: Secondary | ICD-10-CM | POA: Diagnosis not present

## 2023-03-23 DIAGNOSIS — G629 Polyneuropathy, unspecified: Secondary | ICD-10-CM | POA: Diagnosis not present

## 2023-03-23 DIAGNOSIS — I5033 Acute on chronic diastolic (congestive) heart failure: Secondary | ICD-10-CM | POA: Diagnosis not present

## 2023-03-23 DIAGNOSIS — J440 Chronic obstructive pulmonary disease with acute lower respiratory infection: Secondary | ICD-10-CM | POA: Diagnosis not present

## 2023-03-23 DIAGNOSIS — D500 Iron deficiency anemia secondary to blood loss (chronic): Secondary | ICD-10-CM | POA: Diagnosis not present

## 2023-03-23 DIAGNOSIS — I110 Hypertensive heart disease with heart failure: Secondary | ICD-10-CM | POA: Diagnosis not present

## 2023-03-23 DIAGNOSIS — E7800 Pure hypercholesterolemia, unspecified: Secondary | ICD-10-CM | POA: Diagnosis not present

## 2023-03-23 DIAGNOSIS — G8929 Other chronic pain: Secondary | ICD-10-CM | POA: Diagnosis not present

## 2023-03-23 DIAGNOSIS — I2510 Atherosclerotic heart disease of native coronary artery without angina pectoris: Secondary | ICD-10-CM | POA: Diagnosis not present

## 2023-03-23 DIAGNOSIS — J918 Pleural effusion in other conditions classified elsewhere: Secondary | ICD-10-CM | POA: Diagnosis not present

## 2023-03-23 DIAGNOSIS — I482 Chronic atrial fibrillation, unspecified: Secondary | ICD-10-CM | POA: Diagnosis not present

## 2023-03-23 DIAGNOSIS — J441 Chronic obstructive pulmonary disease with (acute) exacerbation: Secondary | ICD-10-CM | POA: Diagnosis not present

## 2023-03-23 DIAGNOSIS — D510 Vitamin B12 deficiency anemia due to intrinsic factor deficiency: Secondary | ICD-10-CM | POA: Diagnosis not present

## 2023-03-23 DIAGNOSIS — M47819 Spondylosis without myelopathy or radiculopathy, site unspecified: Secondary | ICD-10-CM | POA: Diagnosis not present

## 2023-03-23 DIAGNOSIS — M545 Low back pain, unspecified: Secondary | ICD-10-CM | POA: Diagnosis not present

## 2023-03-23 DIAGNOSIS — E039 Hypothyroidism, unspecified: Secondary | ICD-10-CM | POA: Diagnosis not present

## 2023-03-23 DIAGNOSIS — I472 Ventricular tachycardia, unspecified: Secondary | ICD-10-CM | POA: Diagnosis not present

## 2023-03-23 DIAGNOSIS — I071 Rheumatic tricuspid insufficiency: Secondary | ICD-10-CM | POA: Diagnosis not present

## 2023-03-24 DIAGNOSIS — J918 Pleural effusion in other conditions classified elsewhere: Secondary | ICD-10-CM | POA: Diagnosis not present

## 2023-03-24 DIAGNOSIS — I472 Ventricular tachycardia, unspecified: Secondary | ICD-10-CM | POA: Diagnosis not present

## 2023-03-24 DIAGNOSIS — M545 Low back pain, unspecified: Secondary | ICD-10-CM | POA: Diagnosis not present

## 2023-03-24 DIAGNOSIS — J9621 Acute and chronic respiratory failure with hypoxia: Secondary | ICD-10-CM | POA: Diagnosis not present

## 2023-03-24 DIAGNOSIS — G8929 Other chronic pain: Secondary | ICD-10-CM | POA: Diagnosis not present

## 2023-03-24 DIAGNOSIS — N390 Urinary tract infection, site not specified: Secondary | ICD-10-CM | POA: Diagnosis not present

## 2023-03-24 DIAGNOSIS — I5033 Acute on chronic diastolic (congestive) heart failure: Secondary | ICD-10-CM | POA: Diagnosis not present

## 2023-03-24 DIAGNOSIS — D500 Iron deficiency anemia secondary to blood loss (chronic): Secondary | ICD-10-CM | POA: Diagnosis not present

## 2023-03-24 DIAGNOSIS — E7800 Pure hypercholesterolemia, unspecified: Secondary | ICD-10-CM | POA: Diagnosis not present

## 2023-03-24 DIAGNOSIS — M170 Bilateral primary osteoarthritis of knee: Secondary | ICD-10-CM | POA: Diagnosis not present

## 2023-03-24 DIAGNOSIS — G629 Polyneuropathy, unspecified: Secondary | ICD-10-CM | POA: Diagnosis not present

## 2023-03-24 DIAGNOSIS — I482 Chronic atrial fibrillation, unspecified: Secondary | ICD-10-CM | POA: Diagnosis not present

## 2023-03-24 DIAGNOSIS — J441 Chronic obstructive pulmonary disease with (acute) exacerbation: Secondary | ICD-10-CM | POA: Diagnosis not present

## 2023-03-24 DIAGNOSIS — I2510 Atherosclerotic heart disease of native coronary artery without angina pectoris: Secondary | ICD-10-CM | POA: Diagnosis not present

## 2023-03-24 DIAGNOSIS — M47819 Spondylosis without myelopathy or radiculopathy, site unspecified: Secondary | ICD-10-CM | POA: Diagnosis not present

## 2023-03-24 DIAGNOSIS — D510 Vitamin B12 deficiency anemia due to intrinsic factor deficiency: Secondary | ICD-10-CM | POA: Diagnosis not present

## 2023-03-24 DIAGNOSIS — J440 Chronic obstructive pulmonary disease with acute lower respiratory infection: Secondary | ICD-10-CM | POA: Diagnosis not present

## 2023-03-24 DIAGNOSIS — I071 Rheumatic tricuspid insufficiency: Secondary | ICD-10-CM | POA: Diagnosis not present

## 2023-03-24 DIAGNOSIS — E039 Hypothyroidism, unspecified: Secondary | ICD-10-CM | POA: Diagnosis not present

## 2023-03-24 DIAGNOSIS — I110 Hypertensive heart disease with heart failure: Secondary | ICD-10-CM | POA: Diagnosis not present

## 2023-03-28 DIAGNOSIS — I071 Rheumatic tricuspid insufficiency: Secondary | ICD-10-CM | POA: Diagnosis not present

## 2023-03-28 DIAGNOSIS — E7800 Pure hypercholesterolemia, unspecified: Secondary | ICD-10-CM | POA: Diagnosis not present

## 2023-03-28 DIAGNOSIS — D510 Vitamin B12 deficiency anemia due to intrinsic factor deficiency: Secondary | ICD-10-CM | POA: Diagnosis not present

## 2023-03-28 DIAGNOSIS — M545 Low back pain, unspecified: Secondary | ICD-10-CM | POA: Diagnosis not present

## 2023-03-28 DIAGNOSIS — I110 Hypertensive heart disease with heart failure: Secondary | ICD-10-CM | POA: Diagnosis not present

## 2023-03-28 DIAGNOSIS — E039 Hypothyroidism, unspecified: Secondary | ICD-10-CM | POA: Diagnosis not present

## 2023-03-28 DIAGNOSIS — G629 Polyneuropathy, unspecified: Secondary | ICD-10-CM | POA: Diagnosis not present

## 2023-03-28 DIAGNOSIS — I5033 Acute on chronic diastolic (congestive) heart failure: Secondary | ICD-10-CM | POA: Diagnosis not present

## 2023-03-28 DIAGNOSIS — I472 Ventricular tachycardia, unspecified: Secondary | ICD-10-CM | POA: Diagnosis not present

## 2023-03-28 DIAGNOSIS — G8929 Other chronic pain: Secondary | ICD-10-CM | POA: Diagnosis not present

## 2023-03-28 DIAGNOSIS — I482 Chronic atrial fibrillation, unspecified: Secondary | ICD-10-CM | POA: Diagnosis not present

## 2023-03-28 DIAGNOSIS — I2510 Atherosclerotic heart disease of native coronary artery without angina pectoris: Secondary | ICD-10-CM | POA: Diagnosis not present

## 2023-03-28 DIAGNOSIS — D500 Iron deficiency anemia secondary to blood loss (chronic): Secondary | ICD-10-CM | POA: Diagnosis not present

## 2023-03-28 DIAGNOSIS — J918 Pleural effusion in other conditions classified elsewhere: Secondary | ICD-10-CM | POA: Diagnosis not present

## 2023-03-28 DIAGNOSIS — M47819 Spondylosis without myelopathy or radiculopathy, site unspecified: Secondary | ICD-10-CM | POA: Diagnosis not present

## 2023-03-28 DIAGNOSIS — J441 Chronic obstructive pulmonary disease with (acute) exacerbation: Secondary | ICD-10-CM | POA: Diagnosis not present

## 2023-03-28 DIAGNOSIS — M170 Bilateral primary osteoarthritis of knee: Secondary | ICD-10-CM | POA: Diagnosis not present

## 2023-03-28 DIAGNOSIS — N390 Urinary tract infection, site not specified: Secondary | ICD-10-CM | POA: Diagnosis not present

## 2023-03-28 DIAGNOSIS — J440 Chronic obstructive pulmonary disease with acute lower respiratory infection: Secondary | ICD-10-CM | POA: Diagnosis not present

## 2023-03-28 DIAGNOSIS — J9621 Acute and chronic respiratory failure with hypoxia: Secondary | ICD-10-CM | POA: Diagnosis not present

## 2023-03-30 DIAGNOSIS — M47819 Spondylosis without myelopathy or radiculopathy, site unspecified: Secondary | ICD-10-CM | POA: Diagnosis not present

## 2023-03-30 DIAGNOSIS — E039 Hypothyroidism, unspecified: Secondary | ICD-10-CM | POA: Diagnosis not present

## 2023-03-30 DIAGNOSIS — N390 Urinary tract infection, site not specified: Secondary | ICD-10-CM | POA: Diagnosis not present

## 2023-03-30 DIAGNOSIS — E7800 Pure hypercholesterolemia, unspecified: Secondary | ICD-10-CM | POA: Diagnosis not present

## 2023-03-30 DIAGNOSIS — J918 Pleural effusion in other conditions classified elsewhere: Secondary | ICD-10-CM | POA: Diagnosis not present

## 2023-03-30 DIAGNOSIS — I482 Chronic atrial fibrillation, unspecified: Secondary | ICD-10-CM | POA: Diagnosis not present

## 2023-03-30 DIAGNOSIS — I110 Hypertensive heart disease with heart failure: Secondary | ICD-10-CM | POA: Diagnosis not present

## 2023-03-30 DIAGNOSIS — I071 Rheumatic tricuspid insufficiency: Secondary | ICD-10-CM | POA: Diagnosis not present

## 2023-03-30 DIAGNOSIS — I472 Ventricular tachycardia, unspecified: Secondary | ICD-10-CM | POA: Diagnosis not present

## 2023-03-30 DIAGNOSIS — D500 Iron deficiency anemia secondary to blood loss (chronic): Secondary | ICD-10-CM | POA: Diagnosis not present

## 2023-03-30 DIAGNOSIS — I2510 Atherosclerotic heart disease of native coronary artery without angina pectoris: Secondary | ICD-10-CM | POA: Diagnosis not present

## 2023-03-30 DIAGNOSIS — G629 Polyneuropathy, unspecified: Secondary | ICD-10-CM | POA: Diagnosis not present

## 2023-03-30 DIAGNOSIS — M545 Low back pain, unspecified: Secondary | ICD-10-CM | POA: Diagnosis not present

## 2023-03-30 DIAGNOSIS — G8929 Other chronic pain: Secondary | ICD-10-CM | POA: Diagnosis not present

## 2023-03-30 DIAGNOSIS — J9621 Acute and chronic respiratory failure with hypoxia: Secondary | ICD-10-CM | POA: Diagnosis not present

## 2023-03-30 DIAGNOSIS — I5033 Acute on chronic diastolic (congestive) heart failure: Secondary | ICD-10-CM | POA: Diagnosis not present

## 2023-03-30 DIAGNOSIS — J441 Chronic obstructive pulmonary disease with (acute) exacerbation: Secondary | ICD-10-CM | POA: Diagnosis not present

## 2023-03-30 DIAGNOSIS — D510 Vitamin B12 deficiency anemia due to intrinsic factor deficiency: Secondary | ICD-10-CM | POA: Diagnosis not present

## 2023-03-30 DIAGNOSIS — J440 Chronic obstructive pulmonary disease with acute lower respiratory infection: Secondary | ICD-10-CM | POA: Diagnosis not present

## 2023-03-30 DIAGNOSIS — M170 Bilateral primary osteoarthritis of knee: Secondary | ICD-10-CM | POA: Diagnosis not present

## 2023-04-03 DIAGNOSIS — I472 Ventricular tachycardia, unspecified: Secondary | ICD-10-CM | POA: Diagnosis not present

## 2023-04-03 DIAGNOSIS — J441 Chronic obstructive pulmonary disease with (acute) exacerbation: Secondary | ICD-10-CM | POA: Diagnosis not present

## 2023-04-03 DIAGNOSIS — M545 Low back pain, unspecified: Secondary | ICD-10-CM | POA: Diagnosis not present

## 2023-04-03 DIAGNOSIS — M47819 Spondylosis without myelopathy or radiculopathy, site unspecified: Secondary | ICD-10-CM | POA: Diagnosis not present

## 2023-04-03 DIAGNOSIS — E039 Hypothyroidism, unspecified: Secondary | ICD-10-CM | POA: Diagnosis not present

## 2023-04-03 DIAGNOSIS — J918 Pleural effusion in other conditions classified elsewhere: Secondary | ICD-10-CM | POA: Diagnosis not present

## 2023-04-03 DIAGNOSIS — I2510 Atherosclerotic heart disease of native coronary artery without angina pectoris: Secondary | ICD-10-CM | POA: Diagnosis not present

## 2023-04-03 DIAGNOSIS — I110 Hypertensive heart disease with heart failure: Secondary | ICD-10-CM | POA: Diagnosis not present

## 2023-04-03 DIAGNOSIS — I071 Rheumatic tricuspid insufficiency: Secondary | ICD-10-CM | POA: Diagnosis not present

## 2023-04-03 DIAGNOSIS — N390 Urinary tract infection, site not specified: Secondary | ICD-10-CM | POA: Diagnosis not present

## 2023-04-03 DIAGNOSIS — I482 Chronic atrial fibrillation, unspecified: Secondary | ICD-10-CM | POA: Diagnosis not present

## 2023-04-03 DIAGNOSIS — E7800 Pure hypercholesterolemia, unspecified: Secondary | ICD-10-CM | POA: Diagnosis not present

## 2023-04-03 DIAGNOSIS — J440 Chronic obstructive pulmonary disease with acute lower respiratory infection: Secondary | ICD-10-CM | POA: Diagnosis not present

## 2023-04-03 DIAGNOSIS — J9621 Acute and chronic respiratory failure with hypoxia: Secondary | ICD-10-CM | POA: Diagnosis not present

## 2023-04-03 DIAGNOSIS — G629 Polyneuropathy, unspecified: Secondary | ICD-10-CM | POA: Diagnosis not present

## 2023-04-03 DIAGNOSIS — D510 Vitamin B12 deficiency anemia due to intrinsic factor deficiency: Secondary | ICD-10-CM | POA: Diagnosis not present

## 2023-04-03 DIAGNOSIS — D500 Iron deficiency anemia secondary to blood loss (chronic): Secondary | ICD-10-CM | POA: Diagnosis not present

## 2023-04-03 DIAGNOSIS — G8929 Other chronic pain: Secondary | ICD-10-CM | POA: Diagnosis not present

## 2023-04-03 DIAGNOSIS — M170 Bilateral primary osteoarthritis of knee: Secondary | ICD-10-CM | POA: Diagnosis not present

## 2023-04-03 DIAGNOSIS — I5033 Acute on chronic diastolic (congestive) heart failure: Secondary | ICD-10-CM | POA: Diagnosis not present

## 2023-04-04 DIAGNOSIS — G629 Polyneuropathy, unspecified: Secondary | ICD-10-CM | POA: Diagnosis not present

## 2023-04-04 DIAGNOSIS — G8929 Other chronic pain: Secondary | ICD-10-CM | POA: Diagnosis not present

## 2023-04-04 DIAGNOSIS — M170 Bilateral primary osteoarthritis of knee: Secondary | ICD-10-CM | POA: Diagnosis not present

## 2023-04-04 DIAGNOSIS — I071 Rheumatic tricuspid insufficiency: Secondary | ICD-10-CM | POA: Diagnosis not present

## 2023-04-04 DIAGNOSIS — J918 Pleural effusion in other conditions classified elsewhere: Secondary | ICD-10-CM | POA: Diagnosis not present

## 2023-04-04 DIAGNOSIS — I472 Ventricular tachycardia, unspecified: Secondary | ICD-10-CM | POA: Diagnosis not present

## 2023-04-04 DIAGNOSIS — I5033 Acute on chronic diastolic (congestive) heart failure: Secondary | ICD-10-CM | POA: Diagnosis not present

## 2023-04-04 DIAGNOSIS — J9621 Acute and chronic respiratory failure with hypoxia: Secondary | ICD-10-CM | POA: Diagnosis not present

## 2023-04-04 DIAGNOSIS — I482 Chronic atrial fibrillation, unspecified: Secondary | ICD-10-CM | POA: Diagnosis not present

## 2023-04-04 DIAGNOSIS — E7800 Pure hypercholesterolemia, unspecified: Secondary | ICD-10-CM | POA: Diagnosis not present

## 2023-04-04 DIAGNOSIS — I110 Hypertensive heart disease with heart failure: Secondary | ICD-10-CM | POA: Diagnosis not present

## 2023-04-04 DIAGNOSIS — E039 Hypothyroidism, unspecified: Secondary | ICD-10-CM | POA: Diagnosis not present

## 2023-04-04 DIAGNOSIS — J440 Chronic obstructive pulmonary disease with acute lower respiratory infection: Secondary | ICD-10-CM | POA: Diagnosis not present

## 2023-04-04 DIAGNOSIS — N390 Urinary tract infection, site not specified: Secondary | ICD-10-CM | POA: Diagnosis not present

## 2023-04-04 DIAGNOSIS — D500 Iron deficiency anemia secondary to blood loss (chronic): Secondary | ICD-10-CM | POA: Diagnosis not present

## 2023-04-04 DIAGNOSIS — M545 Low back pain, unspecified: Secondary | ICD-10-CM | POA: Diagnosis not present

## 2023-04-04 DIAGNOSIS — I2510 Atherosclerotic heart disease of native coronary artery without angina pectoris: Secondary | ICD-10-CM | POA: Diagnosis not present

## 2023-04-04 DIAGNOSIS — D510 Vitamin B12 deficiency anemia due to intrinsic factor deficiency: Secondary | ICD-10-CM | POA: Diagnosis not present

## 2023-04-04 DIAGNOSIS — M47819 Spondylosis without myelopathy or radiculopathy, site unspecified: Secondary | ICD-10-CM | POA: Diagnosis not present

## 2023-04-04 DIAGNOSIS — J441 Chronic obstructive pulmonary disease with (acute) exacerbation: Secondary | ICD-10-CM | POA: Diagnosis not present

## 2023-04-06 DIAGNOSIS — G629 Polyneuropathy, unspecified: Secondary | ICD-10-CM | POA: Diagnosis not present

## 2023-04-06 DIAGNOSIS — I5033 Acute on chronic diastolic (congestive) heart failure: Secondary | ICD-10-CM | POA: Diagnosis not present

## 2023-04-06 DIAGNOSIS — J918 Pleural effusion in other conditions classified elsewhere: Secondary | ICD-10-CM | POA: Diagnosis not present

## 2023-04-06 DIAGNOSIS — J440 Chronic obstructive pulmonary disease with acute lower respiratory infection: Secondary | ICD-10-CM | POA: Diagnosis not present

## 2023-04-06 DIAGNOSIS — D510 Vitamin B12 deficiency anemia due to intrinsic factor deficiency: Secondary | ICD-10-CM | POA: Diagnosis not present

## 2023-04-06 DIAGNOSIS — I071 Rheumatic tricuspid insufficiency: Secondary | ICD-10-CM | POA: Diagnosis not present

## 2023-04-06 DIAGNOSIS — G8929 Other chronic pain: Secondary | ICD-10-CM | POA: Diagnosis not present

## 2023-04-06 DIAGNOSIS — M170 Bilateral primary osteoarthritis of knee: Secondary | ICD-10-CM | POA: Diagnosis not present

## 2023-04-06 DIAGNOSIS — I482 Chronic atrial fibrillation, unspecified: Secondary | ICD-10-CM | POA: Diagnosis not present

## 2023-04-06 DIAGNOSIS — I2510 Atherosclerotic heart disease of native coronary artery without angina pectoris: Secondary | ICD-10-CM | POA: Diagnosis not present

## 2023-04-06 DIAGNOSIS — I110 Hypertensive heart disease with heart failure: Secondary | ICD-10-CM | POA: Diagnosis not present

## 2023-04-06 DIAGNOSIS — E7800 Pure hypercholesterolemia, unspecified: Secondary | ICD-10-CM | POA: Diagnosis not present

## 2023-04-06 DIAGNOSIS — J441 Chronic obstructive pulmonary disease with (acute) exacerbation: Secondary | ICD-10-CM | POA: Diagnosis not present

## 2023-04-06 DIAGNOSIS — I472 Ventricular tachycardia, unspecified: Secondary | ICD-10-CM | POA: Diagnosis not present

## 2023-04-06 DIAGNOSIS — D500 Iron deficiency anemia secondary to blood loss (chronic): Secondary | ICD-10-CM | POA: Diagnosis not present

## 2023-04-06 DIAGNOSIS — J9621 Acute and chronic respiratory failure with hypoxia: Secondary | ICD-10-CM | POA: Diagnosis not present

## 2023-04-06 DIAGNOSIS — E039 Hypothyroidism, unspecified: Secondary | ICD-10-CM | POA: Diagnosis not present

## 2023-04-06 DIAGNOSIS — N390 Urinary tract infection, site not specified: Secondary | ICD-10-CM | POA: Diagnosis not present

## 2023-04-06 DIAGNOSIS — M545 Low back pain, unspecified: Secondary | ICD-10-CM | POA: Diagnosis not present

## 2023-04-06 DIAGNOSIS — M47819 Spondylosis without myelopathy or radiculopathy, site unspecified: Secondary | ICD-10-CM | POA: Diagnosis not present

## 2023-04-11 DIAGNOSIS — N390 Urinary tract infection, site not specified: Secondary | ICD-10-CM | POA: Diagnosis not present

## 2023-04-11 DIAGNOSIS — D510 Vitamin B12 deficiency anemia due to intrinsic factor deficiency: Secondary | ICD-10-CM | POA: Diagnosis not present

## 2023-04-11 DIAGNOSIS — M47819 Spondylosis without myelopathy or radiculopathy, site unspecified: Secondary | ICD-10-CM | POA: Diagnosis not present

## 2023-04-11 DIAGNOSIS — I482 Chronic atrial fibrillation, unspecified: Secondary | ICD-10-CM | POA: Diagnosis not present

## 2023-04-11 DIAGNOSIS — I071 Rheumatic tricuspid insufficiency: Secondary | ICD-10-CM | POA: Diagnosis not present

## 2023-04-11 DIAGNOSIS — G8929 Other chronic pain: Secondary | ICD-10-CM | POA: Diagnosis not present

## 2023-04-11 DIAGNOSIS — J441 Chronic obstructive pulmonary disease with (acute) exacerbation: Secondary | ICD-10-CM | POA: Diagnosis not present

## 2023-04-11 DIAGNOSIS — E7800 Pure hypercholesterolemia, unspecified: Secondary | ICD-10-CM | POA: Diagnosis not present

## 2023-04-11 DIAGNOSIS — I5033 Acute on chronic diastolic (congestive) heart failure: Secondary | ICD-10-CM | POA: Diagnosis not present

## 2023-04-11 DIAGNOSIS — J918 Pleural effusion in other conditions classified elsewhere: Secondary | ICD-10-CM | POA: Diagnosis not present

## 2023-04-11 DIAGNOSIS — I2510 Atherosclerotic heart disease of native coronary artery without angina pectoris: Secondary | ICD-10-CM | POA: Diagnosis not present

## 2023-04-11 DIAGNOSIS — J9621 Acute and chronic respiratory failure with hypoxia: Secondary | ICD-10-CM | POA: Diagnosis not present

## 2023-04-11 DIAGNOSIS — M170 Bilateral primary osteoarthritis of knee: Secondary | ICD-10-CM | POA: Diagnosis not present

## 2023-04-11 DIAGNOSIS — M545 Low back pain, unspecified: Secondary | ICD-10-CM | POA: Diagnosis not present

## 2023-04-11 DIAGNOSIS — J440 Chronic obstructive pulmonary disease with acute lower respiratory infection: Secondary | ICD-10-CM | POA: Diagnosis not present

## 2023-04-11 DIAGNOSIS — D500 Iron deficiency anemia secondary to blood loss (chronic): Secondary | ICD-10-CM | POA: Diagnosis not present

## 2023-04-11 DIAGNOSIS — I472 Ventricular tachycardia, unspecified: Secondary | ICD-10-CM | POA: Diagnosis not present

## 2023-04-11 DIAGNOSIS — G629 Polyneuropathy, unspecified: Secondary | ICD-10-CM | POA: Diagnosis not present

## 2023-04-11 DIAGNOSIS — I110 Hypertensive heart disease with heart failure: Secondary | ICD-10-CM | POA: Diagnosis not present

## 2023-04-11 DIAGNOSIS — E039 Hypothyroidism, unspecified: Secondary | ICD-10-CM | POA: Diagnosis not present

## 2023-04-16 NOTE — Progress Notes (Signed)
 " Cardiology Office Note:  .   Date:  04/17/2023  ID:  Eddie Kelly, DOB [birth year], MRN [id number] PCP: Gable Cambric, MD  [facility] Providers Cardiologist:  Kameren Baade Eddie Crape, MD    History of Present Illness: .   Eddie Kelly is a 89 y.o. male with a past medical history of hypertension, permanent atrial fibrillation with presence of Watchman device, CAD, PVCs, hypothyroidism, COPD oxygen  dependent.  02/01/2023 echo EF 55%, RA severely dilated, mild MR, mildly sclerotic aortic valve, PASP 41 mmHg 10/25/2022 monitor atrial fibrillation 100% of the time, average heart rate of 70 bpm, 342 episodes of VT runs occurred 10/27/2021 echo EF 60 to 65%, LA mildly dilated, RA moderately dilated, mild MR, moderate TR, PASP moderately elevated at 47 mmHg  Most recently she was evaluated by Dr. Crape on 02/09/2023, he was stable from a cardiac perspective.  He had been dealing with recurrent pleural effusions and he recently had 4 thoracentesis >> sent to the hospital for further evaluation and interventional radiology.  He was admitted on 02/28/2023 to 03/03/2023 to [facility], he underwent a thoracentesis on the seventh which yielded 1.3 L.  Pulmonology and cardiology were consulted, Pleurx catheter was not recommended unless he decided for hospice route, it was ultimately felt that was likely due to diastolic heart failure with recommendations to repeat thoracentesis on an outpatient need.  Creatinine 1.2, potassium 4.8.  Evaluated 03/16/2023 following his hospitalization as outlined above.  He presents today accompanied by several children for follow-up after recent hospitalization as outlined above.  We discussed his progressive nature of recurrent pleural effusions and heart failure.  We also discussed hospice versus palliative care.    He presents today accompanied by several family members for follow-up.  He offers no formal complaints.  His family has enrolled him in outpatient  palliative care services that has not yet begun.  He also has a live-in caregiver.  He was evaluated after last visit by his pulmonologist and a lengthy conversation occurred however the family is not entirely sure what all was discussed, he will see his pulmonologist again on 04/28/2023.  He also has a televisit with his PCP today.    ROS: Review of Systems  Constitutional:  Positive for malaise/fatigue.  Respiratory:  Positive for shortness of breath.   Musculoskeletal:  Positive for falls.  Endo/Heme/Allergies:  Bruises/bleeds easily.  All other systems reviewed and are negative.    Studies Reviewed: .        Cardiac Studies & Procedures   ______________________________________________________________________________________________     ECHOCARDIOGRAM  ECHOCARDIOGRAM COMPLETE 09/29/2017  Narrative *[facility]* [address] [place], [place] [address] [phone]  ------------------------------------------------------------------- Transthoracic Echocardiography  Patient:    Eddie Kelly MR #:       [id number] Study Date: 09/29/2017 Gender:     M Age:        89 Height:     182.9 cm Weight:     91.2 kg BSA:        2.17 m^2 Pt. Status: Room:  [facility] Gable Cambric ATTENDING    Karigan Cloninger Crape, MD ORDERING     Brin Ruggerio Crape, MD SONOGRAPHER  Rutha Silvas, RDCS PERFORMING   [facility] [place]  cc:  ------------------------------------------------------------------- LV EF: 55% -   60%  ------------------------------------------------------------------- Indications:      V728.1 Pre-op evaluation.  Atrial fibrillation - 427.31.  ------------------------------------------------------------------- History:   Risk factors:  Hypertension.  ------------------------------------------------------------------- Study Conclusions  -  Left ventricle: The cavity size was normal. Systolic function was normal. The estimated ejection fraction was in the range  of 55% to 60%. Wall motion was normal; there were no regional wall motion abnormalities. Doppler parameters are consistent with a reversible restrictive pattern, indicative of decreased left ventricular diastolic compliance and/or increased left atrial pressure (grade 3 diastolic dysfunction). - Mitral valve: There was mild regurgitation. - Left atrium: The atrium was mildly dilated. - Right atrium: The atrium was moderately dilated. - Tricuspid valve: There was moderate regurgitation. - Pulmonary arteries: PA peak pressure: 44 mm Hg (S).  Impressions:  - Normal LVEF. Mild LAE. Moderate RAE. Restrictive transmitral flow pattern.  ------------------------------------------------------------------- Study data:  No prior study was available for comparison.  Study status:  Routine.  Procedure:  The patient reported no pain pre or post test. Transthoracic echocardiography. Image quality was adequate.  Study completion:  There were no complications. Transthoracic echocardiography.  M-mode, complete 2D, spectral Doppler, and color Doppler.  Birthdate:  Patient birthdate: Jul 10, 1925.  Age:  Patient is 89 yr old.  Sex:  Gender: male. BMI: 27.3 kg/m^2.  Blood pressure:     174/98  Patient status: Outpatient.  Study date:  Study date: 09/29/2017. Study time: 08:21 AM.  Location:  Echo laboratory.  -------------------------------------------------------------------  ------------------------------------------------------------------- Left ventricle:  The cavity size was normal. Systolic function was normal. The estimated ejection fraction was in the range of 55% to 60%. Wall motion was normal; there were no regional wall motion abnormalities. Doppler parameters are consistent with a reversible restrictive pattern, indicative of decreased left ventricular diastolic compliance and/or increased left atrial pressure (grade 3 diastolic  dysfunction).  ------------------------------------------------------------------- Aortic valve:   Trileaflet; normal thickness leaflets. Mobility was not restricted. Sclerosis without stenosis.  Doppler: Transvalvular velocity was within the normal range. There was no stenosis. There was no regurgitation.  ------------------------------------------------------------------- Aorta:  Aortic root: The aortic root was normal in size.  ------------------------------------------------------------------- Mitral valve:   Structurally normal valve.   Mobility was not restricted.  Doppler:  Transvalvular velocity was within the normal range. There was no evidence for stenosis. There was mild regurgitation.    Valve area by pressure half-time: 5 cm^2. Indexed valve area by pressure half-time: 2.31 cm^2/m^2.    Peak gradient (D): 4 mm Hg.  ------------------------------------------------------------------- Left atrium:  The atrium was mildly dilated.  ------------------------------------------------------------------- Right ventricle:  The cavity size was normal. Wall thickness was normal. Systolic function was normal.  ------------------------------------------------------------------- Pulmonic valve:    Structurally normal valve.   Cusp separation was normal.  Doppler:  Transvalvular velocity was within the normal range. There was no evidence for stenosis. There was no regurgitation.  ------------------------------------------------------------------- Tricuspid valve:   Structurally normal valve.    Doppler: Transvalvular velocity was within the normal range. There was moderate regurgitation.  ------------------------------------------------------------------- Pulmonary artery:   The main pulmonary artery was normal-sized. Systolic pressure was within the normal range.  ------------------------------------------------------------------- Right atrium:  The atrium was moderately  dilated.  ------------------------------------------------------------------- Pericardium:  There was no pericardial effusion.  ------------------------------------------------------------------- Systemic veins: Inferior vena cava: The vessel was normal in size.  ------------------------------------------------------------------- Measurements  Left ventricle                         Value          Reference LV ID, ED, PLAX chordal                47    mm  43 - 52 LV ID, ES, PLAX chordal                33    mm       23 - 38 LV fx shortening, PLAX chordal         30    %        >=29 LV PW thickness, ED                    12    mm       ---------- IVS/LV PW ratio, ED                    1              <=1.3 Stroke volume, 2D                      52    ml       ---------- Stroke volume/bsa, 2D                  24    ml/m^2   ---------- LV e&', lateral                         10.1  cm/s     ---------- LV E/e&', lateral                       10.1           ---------- LV e&', medial                          8.16  cm/s     ---------- LV E/e&', medial                        12.5           ---------- LV e&', average                         9.13  cm/s     ---------- LV E/e&', average                       11.17          ----------  Ventricular septum                     Value          Reference IVS thickness, ED                      12    mm       ----------  LVOT                                   Value          Reference LVOT ID, S                             21    mm       ---------- LVOT area  3.46  cm^2     ---------- LVOT peak velocity, S                  73.9  cm/s     ---------- LVOT mean velocity, S                  49.2  cm/s     ---------- LVOT VTI, S                            15    cm       ---------- LVOT peak gradient, S                  2     mm Hg    ----------  Aorta                                  Value          Reference Aortic root ID,  ED                     40    mm       ---------- Ascending aorta ID, A-P, S             34    mm       ----------  Left atrium                            Value          Reference LA ID, A-P, ES                         51    mm       ---------- LA ID/bsa, A-P                 (H)     2.35  cm/m^2   <=2.2 LA volume, S                           77    ml       ---------- LA volume/bsa, S                       35.5  ml/m^2   ---------- LA volume, ES, 1-p A4C                 81.8  ml       ---------- LA volume/bsa, ES, 1-p A4C             37.8  ml/m^2   ---------- LA volume, ES, 1-p A2C                 69.7  ml       ---------- LA volume/bsa, ES, 1-p A2C             32.2  ml/m^2   ----------  Mitral valve                           Value          Reference Mitral E-wave peak velocity            102  cm/s     ---------- Mitral A-wave peak velocity            30.6  cm/s     ---------- Mitral deceleration time               151   ms       150 - 230 Mitral pressure half-time              44    ms       ---------- Mitral peak gradient, D                4     mm Hg    ---------- Mitral E/A ratio, peak                 3.3            ---------- Mitral valve area, PHT, DP             5     cm^2     ---------- Mitral valve area/bsa, PHT, DP         2.31  cm^2/m^2 ----------  Pulmonary arteries                     Value          Reference PA pressure, S, DP             (H)     44    mm Hg    <=30  Tricuspid valve                        Value          Reference Tricuspid regurg peak velocity         322   cm/s     ---------- Tricuspid peak RV-RA gradient          41    mm Hg    ----------  Right atrium                           Value          Reference RA ID, S-I, ES, A4C            (H)     62.4  mm       34 - 49 RA area, ES, A4C               (H)     28.7  cm^2     8.3 - 19.5 RA volume, ES, A/L                     107   ml       ---------- RA volume/bsa, ES, A/L                 49.4  ml/m^2    ----------  Systemic veins                         Value          Reference Estimated CVP                          3     mm Hg    ----------  Right ventricle  Value          Reference TAPSE                                  16.9  mm       ---------- RV pressure, S, DP             (H)     44    mm Hg    <=30 RV s&', lateral, S                      11.1  cm/s     ----------  Legend: (L)  and  (H)  mark values outside specified reference range.  ------------------------------------------------------------------- Prepared and Electronically Authenticated by  Lamar Fitch, MD 2019-08-09T17:37:30    MONITORS  LONG TERM MONITOR (3-14 DAYS) 11/16/2022  Narrative Patch Wear Time:  13 days and 21 hours (2024-09-03T12:10:20-0400 to 2024-09-17T09:30:05-398)    Atrial Fibrillation occurred continuously (100% burden), ranging from 41-134 bpm (avg of 70 bpm).  Isolated VEs were frequent (6.4%, [id number]), VE Couplets were occasional (1.5%, 8045), and VE Triplets were rare (<1.0%, 1282). Ventricular Bigeminy and Trigeminy were present. 342 Ventricular Tachycardia runs occurred, the run with the fastest interval lasting 4 beats with a max rate of 207 bpm, the longest lasting 12 beats with an avg rate of 115 bpm.       ______________________________________________________________________________________________      Risk Assessment/Calculations:    CHA2DS2-VASc Score = 5   This indicates a 7.2% annual risk of stroke. The patient's score is based upon: CHF History: 1 HTN History: 1 Diabetes History: 0 Stroke History: 0 Vascular Disease History: 1 Age Score: 2 Gender Score: 0            Physical Exam:   VS:  BP 120/70 (BP Location: Left Arm, Patient Position: Sitting, Cuff Size: Normal)   Pulse 76   Ht 6' 2 (1.88 m)   Wt 155 lb (70.3 kg)   SpO2 96%   BMI 19.90 kg/m    Wt Readings from Last 3 Encounters:  04/17/23 155 lb (70.3 kg)  03/16/23 157 lb  (71.2 kg)  02/09/23 162 lb (73.5 kg)    GEN: Frail, no acute distress NECK: No JVD; No carotid bruits CARDIAC: RRR, 2/6 systolic murmur, rubs, gallops RESPIRATORY:  RLL diminished, left lobe clear  ABDOMEN: Soft, non-tender, non-distended EXTREMITIES:  No edema; No deformity   ASSESSMENT AND PLAN: .   Pleural effusion/HFpEF -NYHA class III, he continues to have recurrent pleural effusions, felt to be associated with his diastolic heart failure. Increase Demadex  to 30 mg daily.  Following closely with pulmonology, will see Dr. Mardee again next week. His lung sounds are diminished in his right lower lobe, SpO2 96%.  Sleeping well without orthopnea.  Currently on O2 continuously.  Will repeat BMET.  His family did enroll him in outpatient palliative care services, this will start in the next few weeks.  We did discuss pill-burden, and to discontinue any supplements that are not actively making him feel better.   Falls -he has had a few episodes where his legs get weak, he is unable to make it to sit down in time and subsequently falls.  We previously decreased his metoprolol  dose and it has now been discontinued altogether.  Permanent atrial fibrillation/presence of Watchman device-heart rate is well-controlled at 76.        Dispo:  BMET, follow-up in 8 weeks.  Signed, Delon JAYSON Hoover, NP  "

## 2023-04-17 ENCOUNTER — Encounter: Payer: Self-pay | Admitting: Cardiology

## 2023-04-17 ENCOUNTER — Ambulatory Visit: Payer: Medicare Other | Attending: Cardiology | Admitting: Cardiology

## 2023-04-17 VITALS — BP 120/70 | HR 76 | Ht 74.0 in | Wt 155.0 lb

## 2023-04-17 DIAGNOSIS — I5032 Chronic diastolic (congestive) heart failure: Secondary | ICD-10-CM | POA: Diagnosis not present

## 2023-04-17 DIAGNOSIS — K5909 Other constipation: Secondary | ICD-10-CM | POA: Diagnosis not present

## 2023-04-17 DIAGNOSIS — E785 Hyperlipidemia, unspecified: Secondary | ICD-10-CM | POA: Diagnosis not present

## 2023-04-17 DIAGNOSIS — I4821 Permanent atrial fibrillation: Secondary | ICD-10-CM | POA: Diagnosis not present

## 2023-04-17 DIAGNOSIS — I10 Essential (primary) hypertension: Secondary | ICD-10-CM

## 2023-04-17 DIAGNOSIS — J90 Pleural effusion, not elsewhere classified: Secondary | ICD-10-CM | POA: Diagnosis not present

## 2023-04-17 DIAGNOSIS — Z95818 Presence of other cardiac implants and grafts: Secondary | ICD-10-CM | POA: Diagnosis not present

## 2023-04-17 DIAGNOSIS — R0600 Dyspnea, unspecified: Secondary | ICD-10-CM

## 2023-04-17 DIAGNOSIS — G629 Polyneuropathy, unspecified: Secondary | ICD-10-CM | POA: Diagnosis not present

## 2023-04-17 DIAGNOSIS — Z8679 Personal history of other diseases of the circulatory system: Secondary | ICD-10-CM | POA: Diagnosis not present

## 2023-04-17 NOTE — Patient Instructions (Signed)
 Medication Instructions:  Your physician recommends that you continue on your current medications as directed. Please refer to the Current Medication list given to you today.  *If you need a refill on your cardiac medications before your next appointment, please call your pharmacy*   Lab Work: Your physician recommends that you have a BMP today in the office.  If you have labs (blood work) drawn today and your tests are completely normal, you will receive your results only by: MyChart Message (if you have MyChart) OR A paper copy in the mail If you have any lab test that is abnormal or we need to change your treatment, we will call you to review the results.   Testing/Procedures: None ordered   Follow-Up: At [facility], you and your health needs are our priority.  As part of our continuing mission to provide you with exceptional heart care, we have created designated Provider Care Teams.  These Care Teams include your primary Cardiologist (physician) and Advanced Practice Providers (APPs -  Physician Assistants and Nurse Practitioners) who all work together to provide you with the care you need, when you need it.  We recommend signing up for the patient portal called "MyChart".  Sign up information is provided on this After Visit Summary.  MyChart is used to connect with patients for Virtual Visits (Telemedicine).  Patients are able to view lab/test results, encounter notes, upcoming appointments, etc.  Non-urgent messages can be sent to your provider as well.   To learn more about what you can do with MyChart, go to [email or web address].    Your next appointment:   8 week(s)  The format for your next appointment:   In Person  Provider:   Belva Crome, MD or Wallis Bamberg, NP Rosalita Levan)    Other Instructions none  Important Information About Sugar

## 2023-04-18 DIAGNOSIS — E039 Hypothyroidism, unspecified: Secondary | ICD-10-CM | POA: Diagnosis not present

## 2023-04-18 DIAGNOSIS — J440 Chronic obstructive pulmonary disease with acute lower respiratory infection: Secondary | ICD-10-CM | POA: Diagnosis not present

## 2023-04-18 DIAGNOSIS — I482 Chronic atrial fibrillation, unspecified: Secondary | ICD-10-CM | POA: Diagnosis not present

## 2023-04-18 DIAGNOSIS — M47819 Spondylosis without myelopathy or radiculopathy, site unspecified: Secondary | ICD-10-CM | POA: Diagnosis not present

## 2023-04-18 DIAGNOSIS — I5033 Acute on chronic diastolic (congestive) heart failure: Secondary | ICD-10-CM | POA: Diagnosis not present

## 2023-04-18 DIAGNOSIS — J441 Chronic obstructive pulmonary disease with (acute) exacerbation: Secondary | ICD-10-CM | POA: Diagnosis not present

## 2023-04-18 DIAGNOSIS — N390 Urinary tract infection, site not specified: Secondary | ICD-10-CM | POA: Diagnosis not present

## 2023-04-18 DIAGNOSIS — J9621 Acute and chronic respiratory failure with hypoxia: Secondary | ICD-10-CM | POA: Diagnosis not present

## 2023-04-18 DIAGNOSIS — M170 Bilateral primary osteoarthritis of knee: Secondary | ICD-10-CM | POA: Diagnosis not present

## 2023-04-18 DIAGNOSIS — D500 Iron deficiency anemia secondary to blood loss (chronic): Secondary | ICD-10-CM | POA: Diagnosis not present

## 2023-04-18 DIAGNOSIS — E7800 Pure hypercholesterolemia, unspecified: Secondary | ICD-10-CM | POA: Diagnosis not present

## 2023-04-18 DIAGNOSIS — G629 Polyneuropathy, unspecified: Secondary | ICD-10-CM | POA: Diagnosis not present

## 2023-04-18 DIAGNOSIS — I472 Ventricular tachycardia, unspecified: Secondary | ICD-10-CM | POA: Diagnosis not present

## 2023-04-18 DIAGNOSIS — J918 Pleural effusion in other conditions classified elsewhere: Secondary | ICD-10-CM | POA: Diagnosis not present

## 2023-04-18 DIAGNOSIS — I2510 Atherosclerotic heart disease of native coronary artery without angina pectoris: Secondary | ICD-10-CM | POA: Diagnosis not present

## 2023-04-18 DIAGNOSIS — G8929 Other chronic pain: Secondary | ICD-10-CM | POA: Diagnosis not present

## 2023-04-18 DIAGNOSIS — M545 Low back pain, unspecified: Secondary | ICD-10-CM | POA: Diagnosis not present

## 2023-04-18 DIAGNOSIS — I071 Rheumatic tricuspid insufficiency: Secondary | ICD-10-CM | POA: Diagnosis not present

## 2023-04-18 DIAGNOSIS — I110 Hypertensive heart disease with heart failure: Secondary | ICD-10-CM | POA: Diagnosis not present

## 2023-04-18 DIAGNOSIS — D510 Vitamin B12 deficiency anemia due to intrinsic factor deficiency: Secondary | ICD-10-CM | POA: Diagnosis not present

## 2023-04-18 LAB — BASIC METABOLIC PANEL WITH GFR
BUN/Creatinine Ratio: 20 (ref 10–24)
BUN: 32 mg/dL (ref 10–36)
CO2: 24 mmol/L (ref 20–29)
Calcium: 9.3 mg/dL (ref 8.6–10.2)
Chloride: 101 mmol/L (ref 96–106)
Creatinine, Ser: 1.6 mg/dL — ABNORMAL HIGH (ref 0.76–1.27)
Glucose: 138 mg/dL — ABNORMAL HIGH (ref 70–99)
Potassium: 3.5 mmol/L (ref 3.5–5.2)
Sodium: 145 mmol/L — ABNORMAL HIGH (ref 134–144)
eGFR: 39 mL/min/1.73 — ABNORMAL LOW

## 2023-04-19 DIAGNOSIS — N390 Urinary tract infection, site not specified: Secondary | ICD-10-CM | POA: Diagnosis not present

## 2023-04-19 DIAGNOSIS — M47819 Spondylosis without myelopathy or radiculopathy, site unspecified: Secondary | ICD-10-CM | POA: Diagnosis not present

## 2023-04-19 DIAGNOSIS — G8929 Other chronic pain: Secondary | ICD-10-CM | POA: Diagnosis not present

## 2023-04-19 DIAGNOSIS — D500 Iron deficiency anemia secondary to blood loss (chronic): Secondary | ICD-10-CM | POA: Diagnosis not present

## 2023-04-19 DIAGNOSIS — M170 Bilateral primary osteoarthritis of knee: Secondary | ICD-10-CM | POA: Diagnosis not present

## 2023-04-19 DIAGNOSIS — I4821 Permanent atrial fibrillation: Secondary | ICD-10-CM | POA: Diagnosis not present

## 2023-04-19 DIAGNOSIS — I071 Rheumatic tricuspid insufficiency: Secondary | ICD-10-CM | POA: Diagnosis not present

## 2023-04-19 DIAGNOSIS — J440 Chronic obstructive pulmonary disease with acute lower respiratory infection: Secondary | ICD-10-CM | POA: Diagnosis not present

## 2023-04-19 DIAGNOSIS — I472 Ventricular tachycardia, unspecified: Secondary | ICD-10-CM | POA: Diagnosis not present

## 2023-04-19 DIAGNOSIS — G629 Polyneuropathy, unspecified: Secondary | ICD-10-CM | POA: Diagnosis not present

## 2023-04-19 DIAGNOSIS — E039 Hypothyroidism, unspecified: Secondary | ICD-10-CM | POA: Diagnosis not present

## 2023-04-19 DIAGNOSIS — M545 Low back pain, unspecified: Secondary | ICD-10-CM | POA: Diagnosis not present

## 2023-04-19 DIAGNOSIS — I482 Chronic atrial fibrillation, unspecified: Secondary | ICD-10-CM | POA: Diagnosis not present

## 2023-04-19 DIAGNOSIS — E7800 Pure hypercholesterolemia, unspecified: Secondary | ICD-10-CM | POA: Diagnosis not present

## 2023-04-19 DIAGNOSIS — J9621 Acute and chronic respiratory failure with hypoxia: Secondary | ICD-10-CM | POA: Diagnosis not present

## 2023-04-19 DIAGNOSIS — Z9981 Dependence on supplemental oxygen: Secondary | ICD-10-CM | POA: Diagnosis not present

## 2023-04-19 DIAGNOSIS — I5032 Chronic diastolic (congestive) heart failure: Secondary | ICD-10-CM | POA: Diagnosis not present

## 2023-04-19 DIAGNOSIS — J441 Chronic obstructive pulmonary disease with (acute) exacerbation: Secondary | ICD-10-CM | POA: Diagnosis not present

## 2023-04-19 DIAGNOSIS — I110 Hypertensive heart disease with heart failure: Secondary | ICD-10-CM | POA: Diagnosis not present

## 2023-04-19 DIAGNOSIS — J918 Pleural effusion in other conditions classified elsewhere: Secondary | ICD-10-CM | POA: Diagnosis not present

## 2023-04-19 DIAGNOSIS — I2510 Atherosclerotic heart disease of native coronary artery without angina pectoris: Secondary | ICD-10-CM | POA: Diagnosis not present

## 2023-04-19 DIAGNOSIS — Z95818 Presence of other cardiac implants and grafts: Secondary | ICD-10-CM | POA: Diagnosis not present

## 2023-04-19 DIAGNOSIS — J449 Chronic obstructive pulmonary disease, unspecified: Secondary | ICD-10-CM | POA: Diagnosis not present

## 2023-04-19 DIAGNOSIS — I5033 Acute on chronic diastolic (congestive) heart failure: Secondary | ICD-10-CM | POA: Diagnosis not present

## 2023-04-19 DIAGNOSIS — D510 Vitamin B12 deficiency anemia due to intrinsic factor deficiency: Secondary | ICD-10-CM | POA: Diagnosis not present

## 2023-04-20 DIAGNOSIS — J449 Chronic obstructive pulmonary disease, unspecified: Secondary | ICD-10-CM | POA: Diagnosis not present

## 2023-04-20 DIAGNOSIS — J9611 Chronic respiratory failure with hypoxia: Secondary | ICD-10-CM | POA: Diagnosis not present

## 2023-04-20 DIAGNOSIS — R918 Other nonspecific abnormal finding of lung field: Secondary | ICD-10-CM | POA: Diagnosis not present

## 2023-04-28 DIAGNOSIS — R918 Other nonspecific abnormal finding of lung field: Secondary | ICD-10-CM | POA: Diagnosis not present

## 2023-04-28 DIAGNOSIS — J9611 Chronic respiratory failure with hypoxia: Secondary | ICD-10-CM | POA: Diagnosis not present

## 2023-04-28 DIAGNOSIS — J449 Chronic obstructive pulmonary disease, unspecified: Secondary | ICD-10-CM | POA: Diagnosis not present

## 2023-05-10 DIAGNOSIS — J90 Pleural effusion, not elsewhere classified: Secondary | ICD-10-CM | POA: Diagnosis not present

## 2023-05-10 DIAGNOSIS — Z7951 Long term (current) use of inhaled steroids: Secondary | ICD-10-CM | POA: Diagnosis not present

## 2023-05-10 DIAGNOSIS — Z95818 Presence of other cardiac implants and grafts: Secondary | ICD-10-CM | POA: Diagnosis not present

## 2023-05-10 DIAGNOSIS — I130 Hypertensive heart and chronic kidney disease with heart failure and stage 1 through stage 4 chronic kidney disease, or unspecified chronic kidney disease: Secondary | ICD-10-CM | POA: Diagnosis not present

## 2023-05-10 DIAGNOSIS — I272 Pulmonary hypertension, unspecified: Secondary | ICD-10-CM | POA: Diagnosis not present

## 2023-05-10 DIAGNOSIS — Z87891 Personal history of nicotine dependence: Secondary | ICD-10-CM | POA: Diagnosis not present

## 2023-05-10 DIAGNOSIS — Z95828 Presence of other vascular implants and grafts: Secondary | ICD-10-CM | POA: Diagnosis not present

## 2023-05-10 DIAGNOSIS — R0902 Hypoxemia: Secondary | ICD-10-CM | POA: Diagnosis not present

## 2023-05-10 DIAGNOSIS — I10 Essential (primary) hypertension: Secondary | ICD-10-CM | POA: Diagnosis not present

## 2023-05-10 DIAGNOSIS — I4821 Permanent atrial fibrillation: Secondary | ICD-10-CM | POA: Diagnosis not present

## 2023-05-10 DIAGNOSIS — S72142A Displaced intertrochanteric fracture of left femur, initial encounter for closed fracture: Secondary | ICD-10-CM | POA: Diagnosis not present

## 2023-05-10 DIAGNOSIS — I071 Rheumatic tricuspid insufficiency: Secondary | ICD-10-CM | POA: Diagnosis not present

## 2023-05-10 DIAGNOSIS — S79919A Unspecified injury of unspecified hip, initial encounter: Secondary | ICD-10-CM | POA: Diagnosis not present

## 2023-05-10 DIAGNOSIS — I4891 Unspecified atrial fibrillation: Secondary | ICD-10-CM | POA: Diagnosis not present

## 2023-05-10 DIAGNOSIS — I444 Left anterior fascicular block: Secondary | ICD-10-CM | POA: Diagnosis not present

## 2023-05-10 DIAGNOSIS — Z9889 Other specified postprocedural states: Secondary | ICD-10-CM | POA: Diagnosis not present

## 2023-05-10 DIAGNOSIS — S72145A Nondisplaced intertrochanteric fracture of left femur, initial encounter for closed fracture: Secondary | ICD-10-CM | POA: Diagnosis not present

## 2023-05-10 DIAGNOSIS — Z743 Need for continuous supervision: Secondary | ICD-10-CM | POA: Diagnosis not present

## 2023-05-10 DIAGNOSIS — Z01818 Encounter for other preprocedural examination: Secondary | ICD-10-CM | POA: Diagnosis not present

## 2023-05-10 DIAGNOSIS — E039 Hypothyroidism, unspecified: Secondary | ICD-10-CM | POA: Diagnosis not present

## 2023-05-10 DIAGNOSIS — N390 Urinary tract infection, site not specified: Secondary | ICD-10-CM | POA: Diagnosis not present

## 2023-05-10 DIAGNOSIS — E7800 Pure hypercholesterolemia, unspecified: Secondary | ICD-10-CM | POA: Diagnosis not present

## 2023-05-10 DIAGNOSIS — I5030 Unspecified diastolic (congestive) heart failure: Secondary | ICD-10-CM | POA: Diagnosis not present

## 2023-05-10 DIAGNOSIS — I517 Cardiomegaly: Secondary | ICD-10-CM | POA: Diagnosis not present

## 2023-05-10 DIAGNOSIS — N183 Chronic kidney disease, stage 3 unspecified: Secondary | ICD-10-CM | POA: Diagnosis not present

## 2023-05-10 DIAGNOSIS — D638 Anemia in other chronic diseases classified elsewhere: Secondary | ICD-10-CM | POA: Diagnosis not present

## 2023-05-10 DIAGNOSIS — M1990 Unspecified osteoarthritis, unspecified site: Secondary | ICD-10-CM | POA: Diagnosis not present

## 2023-05-10 DIAGNOSIS — Z87440 Personal history of urinary (tract) infections: Secondary | ICD-10-CM | POA: Diagnosis not present

## 2023-05-10 DIAGNOSIS — I5032 Chronic diastolic (congestive) heart failure: Secondary | ICD-10-CM | POA: Diagnosis not present

## 2023-05-10 DIAGNOSIS — Z9981 Dependence on supplemental oxygen: Secondary | ICD-10-CM | POA: Diagnosis not present

## 2023-05-10 DIAGNOSIS — I2510 Atherosclerotic heart disease of native coronary artery without angina pectoris: Secondary | ICD-10-CM | POA: Diagnosis not present

## 2023-05-10 DIAGNOSIS — W19XXXA Unspecified fall, initial encounter: Secondary | ICD-10-CM | POA: Diagnosis not present

## 2023-05-10 DIAGNOSIS — J449 Chronic obstructive pulmonary disease, unspecified: Secondary | ICD-10-CM | POA: Diagnosis not present

## 2023-05-10 DIAGNOSIS — Z881 Allergy status to other antibiotic agents status: Secondary | ICD-10-CM | POA: Diagnosis not present

## 2023-05-10 DIAGNOSIS — J9611 Chronic respiratory failure with hypoxia: Secondary | ICD-10-CM | POA: Diagnosis not present

## 2023-05-10 DIAGNOSIS — I509 Heart failure, unspecified: Secondary | ICD-10-CM | POA: Diagnosis not present

## 2023-05-10 DIAGNOSIS — J918 Pleural effusion in other conditions classified elsewhere: Secondary | ICD-10-CM | POA: Diagnosis not present

## 2023-05-11 DIAGNOSIS — I5030 Unspecified diastolic (congestive) heart failure: Secondary | ICD-10-CM | POA: Diagnosis not present

## 2023-05-11 DIAGNOSIS — I517 Cardiomegaly: Secondary | ICD-10-CM | POA: Diagnosis not present

## 2023-05-11 DIAGNOSIS — Z9889 Other specified postprocedural states: Secondary | ICD-10-CM | POA: Diagnosis not present

## 2023-05-11 DIAGNOSIS — I272 Pulmonary hypertension, unspecified: Secondary | ICD-10-CM | POA: Diagnosis not present

## 2023-05-11 DIAGNOSIS — J90 Pleural effusion, not elsewhere classified: Secondary | ICD-10-CM | POA: Diagnosis not present

## 2023-05-11 DIAGNOSIS — S72142A Displaced intertrochanteric fracture of left femur, initial encounter for closed fracture: Secondary | ICD-10-CM | POA: Diagnosis not present

## 2023-05-11 DIAGNOSIS — I5032 Chronic diastolic (congestive) heart failure: Secondary | ICD-10-CM | POA: Diagnosis not present

## 2023-05-11 DIAGNOSIS — I4891 Unspecified atrial fibrillation: Secondary | ICD-10-CM | POA: Diagnosis not present

## 2023-05-11 DIAGNOSIS — I130 Hypertensive heart and chronic kidney disease with heart failure and stage 1 through stage 4 chronic kidney disease, or unspecified chronic kidney disease: Secondary | ICD-10-CM | POA: Diagnosis not present

## 2023-05-12 DIAGNOSIS — I5030 Unspecified diastolic (congestive) heart failure: Secondary | ICD-10-CM | POA: Diagnosis not present

## 2023-05-12 DIAGNOSIS — I272 Pulmonary hypertension, unspecified: Secondary | ICD-10-CM | POA: Diagnosis not present

## 2023-05-12 DIAGNOSIS — S72142A Displaced intertrochanteric fracture of left femur, initial encounter for closed fracture: Secondary | ICD-10-CM | POA: Diagnosis not present

## 2023-05-12 DIAGNOSIS — J90 Pleural effusion, not elsewhere classified: Secondary | ICD-10-CM | POA: Diagnosis not present

## 2023-05-12 DIAGNOSIS — I509 Heart failure, unspecified: Secondary | ICD-10-CM | POA: Diagnosis not present

## 2023-05-12 DIAGNOSIS — I130 Hypertensive heart and chronic kidney disease with heart failure and stage 1 through stage 4 chronic kidney disease, or unspecified chronic kidney disease: Secondary | ICD-10-CM | POA: Diagnosis not present

## 2023-05-12 DIAGNOSIS — I4891 Unspecified atrial fibrillation: Secondary | ICD-10-CM | POA: Diagnosis not present

## 2023-05-12 DIAGNOSIS — W19XXXA Unspecified fall, initial encounter: Secondary | ICD-10-CM | POA: Diagnosis not present

## 2023-05-12 DIAGNOSIS — I5032 Chronic diastolic (congestive) heart failure: Secondary | ICD-10-CM | POA: Diagnosis not present

## 2023-05-13 DIAGNOSIS — S72142A Displaced intertrochanteric fracture of left femur, initial encounter for closed fracture: Secondary | ICD-10-CM | POA: Diagnosis not present

## 2023-05-13 DIAGNOSIS — I130 Hypertensive heart and chronic kidney disease with heart failure and stage 1 through stage 4 chronic kidney disease, or unspecified chronic kidney disease: Secondary | ICD-10-CM | POA: Diagnosis not present

## 2023-05-13 DIAGNOSIS — I5032 Chronic diastolic (congestive) heart failure: Secondary | ICD-10-CM | POA: Diagnosis not present

## 2023-05-22 DIAGNOSIS — J90 Pleural effusion, not elsewhere classified: Secondary | ICD-10-CM | POA: Diagnosis not present

## 2023-05-23 DEATH — deceased

## 2023-06-12 ENCOUNTER — Ambulatory Visit: Payer: Medicare Other | Admitting: Cardiology
